# Patient Record
Sex: Male | Born: 1960 | Race: White | Hispanic: No | Marital: Married | Smoking: Never smoker
Health system: Southern US, Community
[De-identification: ages and names within clinical notes are randomized; demographics above are authoritative.]

## PROBLEM LIST (undated history)

## (undated) DIAGNOSIS — S83232A Complex tear of medial meniscus, current injury, left knee, initial encounter: Secondary | ICD-10-CM

## (undated) DIAGNOSIS — I2510 Atherosclerotic heart disease of native coronary artery without angina pectoris: Secondary | ICD-10-CM

## (undated) DIAGNOSIS — S83272A Complex tear of lateral meniscus, current injury, left knee, initial encounter: Secondary | ICD-10-CM

## (undated) DIAGNOSIS — M1990 Unspecified osteoarthritis, unspecified site: Secondary | ICD-10-CM

## (undated) DIAGNOSIS — E782 Mixed hyperlipidemia: Secondary | ICD-10-CM

## (undated) DIAGNOSIS — I712 Thoracic aortic aneurysm, without rupture, unspecified: Secondary | ICD-10-CM

## (undated) DIAGNOSIS — R011 Cardiac murmur, unspecified: Secondary | ICD-10-CM

## (undated) DIAGNOSIS — I10 Essential (primary) hypertension: Secondary | ICD-10-CM

## (undated) DIAGNOSIS — K219 Gastro-esophageal reflux disease without esophagitis: Secondary | ICD-10-CM

## (undated) DIAGNOSIS — G43909 Migraine, unspecified, not intractable, without status migrainosus: Secondary | ICD-10-CM

## (undated) DIAGNOSIS — Z950 Presence of cardiac pacemaker: Secondary | ICD-10-CM

## (undated) DIAGNOSIS — I350 Nonrheumatic aortic (valve) stenosis: Secondary | ICD-10-CM

## (undated) HISTORY — DX: Essential (primary) hypertension: I10

## (undated) HISTORY — DX: Thoracic aortic aneurysm, without rupture: I71.2

## (undated) HISTORY — DX: Cardiac murmur, unspecified: R01.1

## (undated) HISTORY — DX: Nonrheumatic aortic (valve) stenosis: I35.0

## (undated) HISTORY — DX: Atherosclerotic heart disease of native coronary artery without angina pectoris: I25.10

## (undated) HISTORY — DX: Migraine, unspecified, not intractable, without status migrainosus: G43.909

## (undated) HISTORY — DX: Thoracic aortic aneurysm, without rupture, unspecified: I71.20

## (undated) HISTORY — PX: OTHER SURGICAL HISTORY: SHX169

## (undated) HISTORY — DX: Mixed hyperlipidemia: E78.2

---

## 1978-01-17 HISTORY — PX: WISDOM TOOTH EXTRACTION: SHX21

## 2000-10-23 ENCOUNTER — Ambulatory Visit (HOSPITAL_COMMUNITY): Admission: RE | Admit: 2000-10-23 | Discharge: 2000-10-23 | Payer: Self-pay | Admitting: Cardiology

## 2000-10-23 ENCOUNTER — Encounter: Payer: Self-pay | Admitting: Cardiology

## 2004-06-03 ENCOUNTER — Ambulatory Visit: Payer: Self-pay | Admitting: Cardiology

## 2004-06-07 ENCOUNTER — Ambulatory Visit: Payer: Self-pay | Admitting: Cardiology

## 2004-07-26 ENCOUNTER — Ambulatory Visit: Payer: Self-pay | Admitting: Cardiology

## 2004-07-26 ENCOUNTER — Ambulatory Visit: Payer: Self-pay

## 2005-09-13 ENCOUNTER — Ambulatory Visit: Payer: Self-pay

## 2005-09-13 ENCOUNTER — Encounter: Payer: Self-pay | Admitting: Cardiovascular Disease

## 2005-10-12 ENCOUNTER — Ambulatory Visit: Payer: Self-pay | Admitting: Cardiology

## 2006-03-24 ENCOUNTER — Ambulatory Visit: Payer: Self-pay | Admitting: Cardiology

## 2006-04-04 ENCOUNTER — Ambulatory Visit: Payer: Self-pay | Admitting: Cardiology

## 2006-04-04 ENCOUNTER — Ambulatory Visit: Payer: Self-pay

## 2006-04-04 ENCOUNTER — Encounter: Payer: Self-pay | Admitting: Cardiology

## 2006-04-21 ENCOUNTER — Ambulatory Visit: Payer: Self-pay | Admitting: Cardiology

## 2006-09-29 ENCOUNTER — Ambulatory Visit: Payer: Self-pay | Admitting: Cardiology

## 2006-11-08 ENCOUNTER — Ambulatory Visit: Payer: Self-pay | Admitting: Internal Medicine

## 2006-11-08 ENCOUNTER — Ambulatory Visit (HOSPITAL_COMMUNITY): Admission: RE | Admit: 2006-11-08 | Discharge: 2006-11-08 | Payer: Self-pay | Admitting: Cardiology

## 2006-12-19 ENCOUNTER — Ambulatory Visit: Payer: Self-pay | Admitting: Cardiology

## 2006-12-19 LAB — CONVERTED CEMR LAB
Calcium: 9.1 mg/dL (ref 8.4–10.5)
Eosinophils Absolute: 0.4 10*3/uL (ref 0.0–0.6)
Eosinophils Relative: 5.1 % — ABNORMAL HIGH (ref 0.0–5.0)
GFR calc Af Amer: 92 mL/min
GFR calc non Af Amer: 76 mL/min
Glucose, Bld: 143 mg/dL — ABNORMAL HIGH (ref 70–99)
Lymphocytes Relative: 20.2 % (ref 12.0–46.0)
MCV: 89.6 fL (ref 78.0–100.0)
Monocytes Relative: 6.2 % (ref 3.0–11.0)
Neutro Abs: 5.8 10*3/uL (ref 1.4–7.7)
Platelets: 240 10*3/uL (ref 150–400)
Potassium: 4.1 meq/L (ref 3.5–5.1)
WBC: 8.4 10*3/uL (ref 4.5–10.5)

## 2006-12-26 ENCOUNTER — Inpatient Hospital Stay (HOSPITAL_BASED_OUTPATIENT_CLINIC_OR_DEPARTMENT_OTHER): Admission: RE | Admit: 2006-12-26 | Discharge: 2006-12-26 | Payer: Self-pay | Admitting: Internal Medicine

## 2006-12-26 ENCOUNTER — Ambulatory Visit: Payer: Self-pay | Admitting: Surgery

## 2006-12-26 ENCOUNTER — Encounter: Payer: Self-pay | Admitting: Thoracic Surgery (Cardiothoracic Vascular Surgery)

## 2006-12-26 ENCOUNTER — Ambulatory Visit (HOSPITAL_COMMUNITY): Admission: RE | Admit: 2006-12-26 | Discharge: 2006-12-26 | Payer: Self-pay | Admitting: Internal Medicine

## 2006-12-26 ENCOUNTER — Ambulatory Visit: Payer: Self-pay | Admitting: Internal Medicine

## 2006-12-28 ENCOUNTER — Ambulatory Visit: Payer: Self-pay | Admitting: Cardiovascular Disease

## 2007-01-01 ENCOUNTER — Ambulatory Visit: Payer: Self-pay | Admitting: Thoracic Surgery (Cardiothoracic Vascular Surgery)

## 2007-01-18 HISTORY — PX: CORONARY ARTERY BYPASS GRAFT: SHX141

## 2007-01-22 ENCOUNTER — Ambulatory Visit: Payer: Self-pay | Admitting: Thoracic Surgery (Cardiothoracic Vascular Surgery)

## 2007-01-25 ENCOUNTER — Encounter: Payer: Self-pay | Admitting: Thoracic Surgery (Cardiothoracic Vascular Surgery)

## 2007-01-25 ENCOUNTER — Ambulatory Visit: Payer: Self-pay | Admitting: Cardiology

## 2007-01-25 ENCOUNTER — Inpatient Hospital Stay (HOSPITAL_COMMUNITY)
Admission: RE | Admit: 2007-01-25 | Discharge: 2007-01-30 | Payer: Self-pay | Admitting: Thoracic Surgery (Cardiothoracic Vascular Surgery)

## 2007-01-25 ENCOUNTER — Ambulatory Visit: Payer: Self-pay | Admitting: Thoracic Surgery (Cardiothoracic Vascular Surgery)

## 2007-01-25 HISTORY — PX: AORTIC VALVE REPLACEMENT: SHX41

## 2007-02-07 ENCOUNTER — Ambulatory Visit: Payer: Self-pay | Admitting: Cardiology

## 2007-02-15 ENCOUNTER — Encounter (HOSPITAL_COMMUNITY): Admission: RE | Admit: 2007-02-15 | Discharge: 2007-05-16 | Payer: Self-pay | Admitting: Cardiology

## 2007-02-19 ENCOUNTER — Encounter
Admission: RE | Admit: 2007-02-19 | Discharge: 2007-02-19 | Payer: Self-pay | Admitting: Thoracic Surgery (Cardiothoracic Vascular Surgery)

## 2007-02-19 ENCOUNTER — Ambulatory Visit: Payer: Self-pay | Admitting: Thoracic Surgery (Cardiothoracic Vascular Surgery)

## 2007-03-29 ENCOUNTER — Ambulatory Visit: Payer: Self-pay | Admitting: Cardiology

## 2007-03-29 LAB — CONVERTED CEMR LAB
ALT: 16 units/L (ref 0–53)
AST: 17 units/L (ref 0–37)
Albumin: 3.4 g/dL — ABNORMAL LOW (ref 3.5–5.2)
Alkaline Phosphatase: 75 units/L (ref 39–117)
Bilirubin, Direct: 0.1 mg/dL (ref 0.0–0.3)
HDL: 28.6 mg/dL — ABNORMAL LOW (ref >39.0)
VLDL: 11 mg/dL (ref 0–40)

## 2007-04-09 ENCOUNTER — Encounter
Admission: RE | Admit: 2007-04-09 | Discharge: 2007-04-09 | Payer: Self-pay | Admitting: Thoracic Surgery (Cardiothoracic Vascular Surgery)

## 2007-04-09 ENCOUNTER — Ambulatory Visit: Payer: Self-pay | Admitting: Thoracic Surgery (Cardiothoracic Vascular Surgery)

## 2007-04-12 ENCOUNTER — Ambulatory Visit: Payer: Self-pay

## 2007-04-12 ENCOUNTER — Encounter: Payer: Self-pay | Admitting: Cardiology

## 2007-05-17 ENCOUNTER — Encounter (HOSPITAL_COMMUNITY): Admission: RE | Admit: 2007-05-17 | Discharge: 2007-06-17 | Payer: Self-pay | Admitting: Cardiology

## 2007-06-28 ENCOUNTER — Ambulatory Visit: Payer: Self-pay | Admitting: Cardiology

## 2007-10-09 ENCOUNTER — Ambulatory Visit: Payer: Self-pay | Admitting: Cardiology

## 2008-02-08 ENCOUNTER — Ambulatory Visit: Payer: Self-pay | Admitting: Cardiology

## 2008-02-28 ENCOUNTER — Ambulatory Visit: Payer: Self-pay | Admitting: Psychology

## 2008-03-19 ENCOUNTER — Ambulatory Visit: Payer: Self-pay | Admitting: Psychology

## 2008-04-04 ENCOUNTER — Ambulatory Visit: Payer: Self-pay | Admitting: Psychology

## 2008-04-14 ENCOUNTER — Ambulatory Visit: Payer: Self-pay | Admitting: Psychology

## 2008-05-31 DIAGNOSIS — I10 Essential (primary) hypertension: Secondary | ICD-10-CM | POA: Insufficient documentation

## 2008-05-31 DIAGNOSIS — E785 Hyperlipidemia, unspecified: Secondary | ICD-10-CM | POA: Insufficient documentation

## 2008-05-31 DIAGNOSIS — I359 Nonrheumatic aortic valve disorder, unspecified: Secondary | ICD-10-CM | POA: Insufficient documentation

## 2008-05-31 DIAGNOSIS — G43909 Migraine, unspecified, not intractable, without status migrainosus: Secondary | ICD-10-CM | POA: Insufficient documentation

## 2008-05-31 DIAGNOSIS — D500 Iron deficiency anemia secondary to blood loss (chronic): Secondary | ICD-10-CM | POA: Insufficient documentation

## 2008-06-04 ENCOUNTER — Encounter (INDEPENDENT_AMBULATORY_CARE_PROVIDER_SITE_OTHER): Payer: Self-pay | Admitting: *Deleted

## 2008-07-23 ENCOUNTER — Telehealth: Payer: Self-pay | Admitting: Cardiology

## 2008-08-28 ENCOUNTER — Telehealth (INDEPENDENT_AMBULATORY_CARE_PROVIDER_SITE_OTHER): Payer: Self-pay

## 2008-08-28 ENCOUNTER — Encounter (INDEPENDENT_AMBULATORY_CARE_PROVIDER_SITE_OTHER): Payer: Self-pay | Admitting: *Deleted

## 2008-08-29 ENCOUNTER — Telehealth: Payer: Self-pay | Admitting: Cardiology

## 2008-09-04 ENCOUNTER — Ambulatory Visit: Payer: Self-pay | Admitting: Cardiology

## 2008-09-04 DIAGNOSIS — R0609 Other forms of dyspnea: Secondary | ICD-10-CM | POA: Insufficient documentation

## 2008-09-04 DIAGNOSIS — R0989 Other specified symptoms and signs involving the circulatory and respiratory systems: Secondary | ICD-10-CM | POA: Insufficient documentation

## 2008-09-11 ENCOUNTER — Telehealth (INDEPENDENT_AMBULATORY_CARE_PROVIDER_SITE_OTHER): Payer: Self-pay

## 2008-09-15 ENCOUNTER — Encounter: Payer: Self-pay | Admitting: Cardiology

## 2008-09-15 ENCOUNTER — Ambulatory Visit: Payer: Self-pay

## 2008-09-15 DIAGNOSIS — I6529 Occlusion and stenosis of unspecified carotid artery: Secondary | ICD-10-CM | POA: Insufficient documentation

## 2008-09-15 DIAGNOSIS — R5381 Other malaise: Secondary | ICD-10-CM | POA: Insufficient documentation

## 2008-09-15 DIAGNOSIS — R5383 Other fatigue: Secondary | ICD-10-CM

## 2008-09-16 ENCOUNTER — Encounter: Payer: Self-pay | Admitting: Cardiology

## 2008-09-16 LAB — CONVERTED CEMR LAB
ALT: 29 units/L (ref 0–53)
Bilirubin, Direct: 0.1 mg/dL (ref 0.0–0.3)
Total Bilirubin: 1.3 mg/dL — ABNORMAL HIGH (ref 0.3–1.2)
Total CHOL/HDL Ratio: 4
VLDL: 31.2 mg/dL (ref 0.0–40.0)

## 2008-12-24 ENCOUNTER — Encounter (INDEPENDENT_AMBULATORY_CARE_PROVIDER_SITE_OTHER): Payer: Self-pay | Admitting: *Deleted

## 2009-03-04 ENCOUNTER — Encounter (INDEPENDENT_AMBULATORY_CARE_PROVIDER_SITE_OTHER): Payer: Self-pay | Admitting: *Deleted

## 2009-04-07 ENCOUNTER — Ambulatory Visit: Payer: Self-pay | Admitting: Psychology

## 2009-04-21 ENCOUNTER — Ambulatory Visit: Payer: Self-pay | Admitting: Psychology

## 2009-05-27 ENCOUNTER — Ambulatory Visit: Payer: Self-pay | Admitting: Psychology

## 2009-10-01 ENCOUNTER — Encounter: Payer: Self-pay | Admitting: Cardiology

## 2010-01-26 ENCOUNTER — Encounter: Payer: Self-pay | Admitting: Cardiology

## 2010-02-14 LAB — CONVERTED CEMR LAB
Albumin: 4.2 g/dL (ref 3.5–5.2)
Alkaline Phosphatase: 79 units/L (ref 39–117)
BUN: 17 mg/dL (ref 6–23)
Basophils Absolute: 0 10*3/uL (ref 0.0–0.1)
Bilirubin, Direct: 0 mg/dL (ref 0.0–0.3)
CO2: 31 meq/L (ref 19–32)
Calcium: 9.2 mg/dL (ref 8.4–10.5)
Creatinine, Ser: 0.9 mg/dL (ref 0.4–1.5)
Direct LDL: 51.4 mg/dL
Eosinophils Absolute: 0.3 10*3/uL (ref 0.0–0.7)
Glucose, Bld: 91 mg/dL (ref 70–99)
HDL: 35.4 mg/dL — ABNORMAL LOW (ref >39.00)
Lymphocytes Relative: 20.9 % (ref 12.0–46.0)
MCHC: 34.5 g/dL (ref 30.0–36.0)
MCV: 88.8 fL (ref 78.0–100.0)
Monocytes Absolute: 0.5 10*3/uL (ref 0.1–1.0)
Neutrophils Relative %: 69.8 % (ref 43.0–77.0)
Platelets: 197 10*3/uL (ref 150.0–400.0)
RDW: 11.5 % (ref 11.5–14.6)
TSH: 1.06 microintl units/mL (ref 0.35–5.50)
Triglycerides: 240 mg/dL — ABNORMAL HIGH (ref 0.0–149.0)
VLDL: 48 mg/dL — ABNORMAL HIGH (ref 0.0–40.0)

## 2010-02-16 NOTE — Letter (Signed)
Summary: Appointment - Missed  [facility]     [place], [place]    Phone:   Fax:      March 04, 2009 MRN: [id number]   Jonathan Jones [address] [place], [place]  [address]   Dear [name],  Our records indicate you missed your appointment on  03-03-2009   with Dr.  Daleen Squibb     .                                    It is very important that we reach you to reschedule this appointment. We look forward to participating in your health care needs. Please contact us at the number listed above at your earliest convenience to reschedule this appointment.     Sincerely,   Lorne Skeens  [facility] Scheduling Team

## 2010-02-18 NOTE — Miscellaneous (Signed)
  Clinical Lists Changes  Medications: Rx of COREG CR 80 MG XR24H-CAP (CARVEDILOL PHOSPHATE) Take 1 capsule by mouth once a day;  #30 x 11;  Signed;  Entered by: Lisabeth Devoid RN;  Authorized by: Gaylord Shih, MD, [facility];  Method used: Electronically to [id number] [address] [place], [place]  [address], Ph: [phone], Fax: [phone]    Prescriptions: COREG CR 80 MG XR24H-CAP (CARVEDILOL PHOSPHATE) Take 1 capsule by mouth once a day  #30 x 11   Entered by:   Lisabeth Devoid RN   Authorized by:   Gaylord Shih, MD, [facility]   Signed by:   Lisabeth Devoid RN on 01/26/2010   Method used:   Electronically to        [facility]* (retail)       [address]       [place], [place]  [address]       Ph: [phone]       Fax: [phone]   RxID:   [id number]

## 2010-06-01 NOTE — Op Note (Signed)
NAME:  Jonathan Jones, Jonathan Jones NO.:  [id number]   MEDICAL RECORD NO.:  [id number]          PATIENT TYPE:  INP   LOCATION:  2309                         FACILITY:  [facility]   PHYSICIAN:  Salvatore Decent. Cornelius Moras, M.D. DATE OF BIRTH:  09-24-1960   DATE OF PROCEDURE:  01/25/2007  DATE OF DISCHARGE:                               OPERATIVE REPORT   PREOPERATIVE DIAGNOSES:  1. Severe aortic stenosis.  2. Two-vessel coronary artery disease.   POSTOPERATIVE DIAGNOSES:  1. Severe aortic stenosis.  2. Two-vessel coronary artery disease.   PROCEDURE:  Median sternotomy for aortic valve replacement (25-mm  Edwards Magna pericardial tissue valve) and coronary artery bypass  grafting x3 (left internal mammary artery to distal left anterior  descending coronary artery, saphenous vein graft to first diagonal  branch, saphenous vein graft to posterior descending coronary artery,  endoscopic saphenous vein harvest from right thigh) and CorMatrix  reconstruction of the pericardium.   SURGEON:  Salvatore Decent. Cornelius Moras, M.D.   ASSISTANT:  Salvatore Decent. Dorris Fetch, M.D.   SECOND ASSISTANT:  Stephanie Acre. Dominick, PA   ANESTHESIA:  General.   BRIEF CLINICAL NOTE:  The patient is a 50 year old male from [place]  with known history of bicuspid aortic valve and aortic stenosis.  He has  been followed carefully and now presents with progressive symptoms of  exertional shortness of breath and chest discomfort.  Echocardiogram  confirms progression of the severity of aortic stenosis with calculated  aortic valve area estimated 0.89 sq. cm.  There is normal left  ventricular function.  Cardiac catheterization confirms the presence of  severe aortic stenosis and also demonstrates moderate two-vessel  coronary artery disease.  A full consultation has been dictated  previously.  The patient has been counseled at length regarding the  indications, risks, and potential benefits of surgery.  Alternative  treatment  strategies have been discussed.  He understands and accepts  all associated risks of surgery and desired to proceed as described.  In  particular, the patient has been counseled regarding alternatives for  replacement of his aortic valve with respect to use of a mechanical  prosthesis versus a bioprosthetic tissue valve.  After considerable  discussion, the patient specifically requests that a bioprosthetic  tissue valve be utilized in an effort to avoid the need for long-term  anticoagulation with Coumadin.  The patient understands that given his  age, there is significant risk of late structural valve deterioration  and failure.   OPERATIVE FINDINGS:  1. Normal left ventricular systolic function with moderate left      ventricular hypertrophy.  2. Bicuspid native aortic valve with severe aortic stenosis.  3. Good-quality left internal mammary artery and saphenous vein      conduit for grafting.  4. Good-quality target vessels for grafting   OPERATIVE NOTE IN DETAIL:  The patient is brought to the operating room  on the above-mentioned date and central monitoring is established by the  anesthesia service under the care and direction of Dr. Diamantina Monks.  Specifically, a Swan-Ganz catheter is placed through the right internal  jugular approach.  A radial arterial line is placed.  Intravenous  antibiotics are administered.  Following induction with general  endotracheal anesthesia, a Foley catheter is placed.  The patient's  chest, abdomen, both groins, and both lower extremities are prepared and  draped in a sterile manner.  Baseline transesophageal echocardiogram was  performed by Dr. Katrinka Blazing.  This confirms the presence of severe aortic  stenosis with a heavily-calcified aortic valve.  There is trace mitral  regurgitation.  There is normal left ventricular systolic function with  at least moderate left ventricular hypertrophy.   A median sternotomy incision is performed and left  internal mammary  artery is dissected from the chest wall and prepared for bypass  grafting.  The left internal mammary artery is good-quality conduit.  Simultaneously saphenous vein was obtained the patient's right thigh  using endoscopic vein harvest technique.  The saphenous vein is good-  quality conduit.  After the saphenous vein has been removed, the small  incision in the right thigh is closed in multiple layers with running  absorbable suture.  The patient is heparinized systemically and the left  internal mammary artery transected distally.  It is noted to have  excellent flow.   The pericardium is opened.  The ascending aorta is mildly dilated but  otherwise normal in appearance.  Maximum transverse diameter of the  ascending thoracic aorta is approximately 4.0 cm.  The ascending aorta  is cannulated for cardiopulmonary bypass.  A venous cannula is placed  through the tip of the right atrial appendage.  A retrograde  cardioplegia catheter is placed through the right atrium into the  coronary sinus.  Adequate heparinization is verified.  Cardiopulmonary  bypass is begun and a left ventricular vent is placed through the right  superior pulmonary vein.  Distal target sites are selected for coronary  bypass grafting.  A temperature probe is placed in the left ventricular  septum and a cardioplegia catheter is placed in the ascending aorta.   The patient is cooled to 32 degrees' systemic temperature.  The aortic  crossclamp is applied and cold blood cardioplegia is administered  initially in antegrade fashion through the aortic root.  Iced saline  slush is applied for topical hypothermia.  The initial cardioplegic  arrest and myocardial cooling is felt to be excellent.  Supplemental  cardioplegia is administered retrograde through the coronary sinus  catheter.  Repeat doses of cardioplegia are administered intermittently  throughout the crossclamp portion of the operation through  the aortic  root, down subsequently-placed vein grafts, and retrograde through the  coronary sinus catheter to maintain left ventricular septal temperature  below 15 degrees centigrade.   The following distal coronary anastomoses are performed:  1. Posterior descending coronary artery is grafted with a saphenous      vein graft in an end-to-side fashion.  This vessel measures 1.5 mm      in diameter and is a good-quality target vessel for grafting.  2. The diagonal branch off the left anterior descending coronary      artery is grafted with a saphenous vein graft in an end-to-side      fashion.  This vessel measures 1.4 mm and is a good-quality target      vessel for grafting.  3. The distal left anterior descending coronary artery is grafted with      the left internal mammary artery in an end-to-side fashion.  This      vessel measures 2.0 mm in diameter and is a good-quality  target      vessel for grafting.   An oblique aortotomy incision is performed.  The aortic valve is  exposed.  The aortic valve is congenitally bicuspid with a fused raphe.  There is severe aortic stenosis with heavy calcification of both  leaflets.  The left and right coronary arteries are both in a normal  anatomical location.  The aortic valve is excised sharply.  The aortic  annulus is decalcified.  There is a moderate amount of calcification.  The aortic root is subsequently irrigated with copious iced saline  solution.  The aortic annulus is sized to accept a 25-mm stented  bioprosthetic tissue valve.   Aortic valve replacement is performed using interrupted 2-0 Ethibond  horizontal mattress pledgeted sutures with pledgets in the subannular  position.  An [facility] pericardial tissue valve (model number 3000,  serial number [id number]) is secured in place uneventfully.  The valve  seats easily and there is plenty of distance between the sewing ring and  the left main coronary artery and the right  coronary arteries,  respectively.  Rewarming is begun.   The aortotomy is closed using a two-layer closure of running 4-0 Prolene  suture.  Both proximal saphenous vein anastomoses are performed directly  to the ascending aorta prior to removal of the aortic crossclamp.  The  patient is placed in Trendelenburg position and the lungs are allowed to  fill while all air is evacuated through the aortic root.  One final dose  of warm retrograde hot-shot cardioplegia is administered.  The aortic  crossclamp is removed after a total crossclamp time of 129 minutes.  The  heart is defibrillated and subsequently normal sinus rhythm resumes.  All proximal and distal coronary anastomoses are inspected for  hemostasis and appropriate graft orientation.  The aortotomy incision is  carefully inspected for meticulous hemostasis.  Epicardial pacing wires  are affixed to the right ventricular free wall and to the right atrial  appendage.  The left ventricular vent is removed and a Magoon needle is  placed in the ascending aorta to serve as a root vent.  The lungs were  ventilated and the patient subsequently weaned from cardiopulmonary  bypass without difficulty.  The patient's rhythm at separation from  bypass is normal sinus rhythm with first-degree AV block.  Initially AV  sequential pacing is employed but pacing is discontinued when sinus  rhythm without AV block resumes shortly after separation from bypass.  Total cardiopulmonary bypass time for the operation is 115 minutes.  No  inotropic support is required.  Follow-up transesophageal echocardiogram  demonstrates preserved left ventricular function.  There is a well-  seated bioprosthetic tissue valve in the aortic position.  There is no  perivalvular leak.  There is no mitral regurgitation.  There is no  residual air.   The aortic root vent is removed.  The venous and arterial cannulae are  removed.  Protamine is administered to reverse the  anticoagulation.  The  mediastinum and left chest are irrigated with saline solution containing  vancomycin.  Meticulous surgical hemostasis is ascertained.  The  mediastinum and the left chest are drained using three chest tubes  exited through separate stab incisions inferiorly.  The pericardial sac  is reconstructed using a CorMatrix pericardium closure.  The patch is  sewn circumferentially using running 4-0 Prolene suture.  The sternum is  closed with double-strength sternal wire.  The On-Q continuous pain  management system is utilized to facilitate postoperative pain  control.  Two 10-inch catheters supplied with the On-Q kit are tunneled into the  deep subcutaneous tissues and positioned just lateral to the lateral  border of the sternum on either side.  Each catheter is flushed with 5  mL of 0.5% bupivacaine solution and ultimately connected to a continuous  infusion pump.  The soft tissues anterior the sternum are closed in  multiple layers and the skin is closed with a running subcuticular skin  closure.   The patient tolerated the procedure well and is transported to the  surgical intensive care unit in stable condition.  There are no  intraoperative complications.  All sponge, instrument and needle counts  are verified correct at completion of the operation.  No blood products  were administered.      Salvatore Decent. Cornelius Moras, M.D.  Electronically Signed     CHO/MEDQ  D:  01/25/2007  T:  01/25/2007  Job:  [id number]   cc:   Thomas C. Daleen Squibb, MD, [facility]  Theressa Millard, M.D.

## 2010-06-01 NOTE — Assessment & Plan Note (Signed)
[facility]                            CARDIOLOGY OFFICE NOTE   NAME:Jonathan Jones, Jonathan Jones                       MRN:          [id number]  DATE:06/28/2007                            DOB:          [birth year]    Jonathan Jones comes in today for followup.  He is now about 5 months out from  his aortic valve replacement and coronary artery bypass grafting.   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  He is  feeling remarkably good.  He has loosened up on his diet a bit, but his  weight is still about 5 or 6 pounds less than his baseline weight.  He  feels good.  He denies any chest discomfort, soreness in his chest,  orthopnea, PND, exertional dizziness, pre-syncope, syncope or peripheral  edema.   His lipids were so low that we decreased his to Lipitor 10 mg daily.  He  is due lipids next week with Benjaman Kindler.   CURRENT MEDICATIONS:  1. Aspirin 325 a day.  2. Coreg CR 80 mg a day.  3. Doxepin 25 mg two a day.  4. Lipitor 10 mg a day.   His blood pressure is 152/92.  I repeated it in the left arm with a  large cuff and it was 135/85, his pulse is 62 and regular.  Weight is  188.  HEENT:  His skin color is back to normal. Respirations 18 unlabored.  HEENT:  Otherwise unchanged.  NECK: Neck is supple, good carotid upstrokes.  Thyroid is not enlarged.  Trachea is midline.  LUNGS:  Clear.  HEART:  Sternotomy site is stable.  Heart reveals a regular rate and  rhythm.  He has no murmur, S2 splits physiologically.  ABDOMEN:  Is  soft.  EXTREMITIES: No edema.  Pulses are intact.  NEUROLOGIC:  Exam is intact.   A 2-D echocardiogram April 12, 2007, showed trivial perivalvular leak.   Jonathan Jones is doing remarkably well.  I have made no changes to his medical  program.  We talked about avoiding isometrics but other forms of  activity or findings as long as he stays well-hydrated and does not  overdo it.  He will have his lipids checked  by Benjaman Kindler in the next  week or so.  LDL goal is 70 or less. Hopefully, his HDL is back in the  high 30s to low 40s.  Hope his triglycerides will remain low with good  dietary control.   Plan on seeing him back again in 3 months.     Thomas C. Daleen Squibb, MD, [facility]  Electronically Signed    TCW/MedQ  DD: 06/28/2007  DT: 06/28/2007  Job #: [id number]   cc:   Theressa Millard, M.D.

## 2010-06-01 NOTE — Assessment & Plan Note (Signed)
[facility]                            CARDIOLOGY OFFICE NOTE   NAME:[name], [name]                       MRN:          [id number]  DATE:02/08/2008                            DOB:          07/20/1960    Jonathan Jones comes in today for followup.  He is doing remarkably well.  He  continues to exercise on a regular basis and is keeping his weight  stable.   He is currently on aspirin 325 mg a day, Coreg CR 80 mg a day, doxepin  25 mg 2 daily, Lipitor 10 mg at bedtime.   PHYSICAL EXAMINATION:  VITAL SIGNS:  His blood pressure today is the  best I have seen, it is 111/79, his pulse is 73 and regular, his weight  is 187, which is stable.  HEENT:  Normal.  NECK:  Carotid upstrokes were equal bilaterally without bruits.  Thyroid  is not enlarged.  Trachea is midline.  LUNGS:  Clear to auscultation and percussion.  HEART:  Regular rate and rhythm.  S2 splits.  There is no diastolic  murmur.  ABDOMEN:  Soft.  Good bowel sounds.  No midline bruit.  EXTREMITIES:  No cyanosis, clubbing, or edema.  Pulses are intact.   EKG today is completely normal except for some nonspecific T-wave  changes.   ASSESSMENT/PLAN:  Jonathan Jones is doing remarkably well.  His bowel sounds are  stable.  His last echo was a year ago in March, which basically showed a  trivial perivalvular leak.   I will plan on seeing him back again in 6 months.  At that time, we will  set him up for a 2-D echo and stress Myoview.     Thomas C. Daleen Squibb, MD, [facility]  Electronically Signed    TCW/MedQ  DD: 02/08/2008  DT: 02/09/2008  Job #: [id number]

## 2010-06-01 NOTE — Discharge Summary (Signed)
NAME:  Jonathan Jones, Jonathan Jones NO.:  [id number]   MEDICAL RECORD NO.:  [id number]          PATIENT TYPE:  INP   LOCATION:  2017                         FACILITY:  [facility]   PHYSICIAN:  Salvatore Decent. Cornelius Moras, M.D. DATE OF BIRTH:  1960-02-02   DATE OF ADMISSION:  01/25/2007  DATE OF DISCHARGE:  01/30/2007                               DISCHARGE SUMMARY   PRIMARY ADMITTING DIAGNOSES:  1. Severe aortic stenosis.  2. Two-vessel coronary artery disease.   ADDITION/DISCHARGE DIAGNOSES:  1. Severe aortic stenosis.  2. Two-vessel coronary artery disease.  3. Hypertension and hyperlipidemia.  4. History of migraines.  5. Postoperative blood loss anemia.   PROCEDURES PERFORMED:  1. Aortic valve replacement with 25-mm Edwards magna pericardial      valve.  2. Coronary artery bypass grafting x3 (left internal mammary artery to      the distal LAD, saphenous vein graft to the first diagonal,      saphenous vein graft to the posterior descending).  3. Endoscopic vein harvest right thigh.  4. Core matrix reconstruction of the pericardium.   HISTORY:  The patient is a 50 year old male with a known history of  bicuspid aortic valve and aortic stenosis who has been followed since  approximately age 28.  He recently presented with progressive symptoms  of exertional shortness of breath and chest discomfort.  He was seen by  Dr. Daleen Squibb and underwent echocardiogram which confirmed worsening severity  of aortic stenosis with a calculated aortic valve area estimated at 0.89  cm2.  Left ventricular function was normal.  He subsequently underwent  cardiac catheterization by Dr. Gala Romney which showed severe aortic  stenosis with moderate two-vessel coronary artery disease.  Because of  his worsening symptoms of shortness of breath and chest discomfort and  his worsening aortic stenosis with underlying coronary artery disease.  It was felt that he would best be treated with elective aortic valve  replacement and coronary artery bypass surgery.  He was referred to Dr.  Tressie Stalker as an outpatient consultation and for consideration of  surgery.  Dr. Cornelius Moras saw the patient and reviewed his films and agreed  that he would benefit from surgery.  He explained the risks, benefits  and alternatives of the procedure to the patient and he agreed to  proceed.  The patient has been counseled prior to surgery regarding the  options for valve replacement, and he specifically requests a  bioprosthetic tissue valve to utilize in order to reduce his need for  long-term anticoagulation with Coumadin.   HOSPITAL COURSE:  Mr. Doverspike was brought into [facility] on  January 25, 2007, and underwent CABG x3 and aortic valve replacement by  Dr. Cornelius Moras as described in detail above.  He tolerated the procedures well  and was transferred to the SICU in stable condition.  He was able to be  extubated shortly after surgery.  He was hemodynamically stable and  doing well on postop day #1.  He did initially require a low-dose Neo-  Synephrine drip but this was weaned over the course of his first  postoperative day.  By the end of postop day #1, he was off all drips  and was able to transfer to the step-down unit.  Postoperatively, he has  done very well.  He has had postoperative blood loss anemia which has  remained stable and has been treated conservatively with iron  supplementation.  He has also been volume overloaded and is diuresing  well with Lasix.  His creatinine bumped up initially to 1.52, but is  trending downward at present.  He is ambulating the halls with cardiac  rehab phase one and is making good progress.  He is tolerating a regular  diet and is having normal bowel and bladder function.  His incisions are  all healing well.  His most recent labs showed hemoglobin 7.9,  hematocrit 22.8, white count 11.0, platelets 130, sodium 132, potassium  5.3, BUN 38, creatinine 1.38.  It was felt that  if he remained stable  and no acute changes occur.  He will hopefully be ready for discharge  home by January 30, 2007.   DISCHARGE MEDICATIONS:  1. Enteric-coated aspirin 325 mg daily.  2. Coreg 12.5 mg b.i.d.  3. Ultram 50-100 mg q. 4-6 hours p.r.n. for pain.  4. Nu-Iron 150 mg daily.  5. Lasix 40 mg daily x1 week.  6. K-Dur 20 mEq daily x1 week.  7. Tricor 160 mg daily.  8. Doxepin 50 mg q.h.s.   DISCHARGE INSTRUCTIONS:  He is asked to refrain from driving, heavy  lifting or strenuous activity.  He may continue ambulating daily and  using his incentive spirometer.  He may shower daily and clean his  incisions with soap and water.  He will continue low-fat, low-sodium  diet.   DISCHARGE FOLLOWUP:  He will make an appointment to see Dr. Daleen Squibb in 2  weeks.  He will then follow up with Dr. Cornelius Moras on February 2 at 1:45 p.m..  He will have chest x-ray prior to this appointment at [facility].  In the interim, if he experiences any  problems or has questions he is asked to contact our office immediately.      Coral Ceo, P.A.      Salvatore Decent. Cornelius Moras, M.D.  Electronically Signed    GC/MEDQ  D:  01/29/2007  T:  01/29/2007  Job:  [id number]   cc:   Thomas C. Daleen Squibb, MD, [facility]  [facility] Office  Theressa Millard, M.D.

## 2010-06-01 NOTE — Cardiovascular Report (Signed)
Jonathan Jones, Jonathan Jones NO.:  [id number]   MEDICAL RECORD NO.:  [id number]          PATIENT TYPE:  OUT   LOCATION:  VASC                         FACILITY:  [facility]   PHYSICIAN:  Bevelyn Buckles. Bensimhon, MDDATE OF BIRTH:  [birth year]   DATE OF PROCEDURE:  12/26/2006  DATE OF DISCHARGE:                            CARDIAC CATHETERIZATION   PRIMARY CARE PHYSICIAN:  Theressa Millard, M.D.  Cardiologist, Jesse Sans.  Daleen Squibb, MD, [facility]   INDICATIONS:  Jonathan Jones is a delightful, 50 year old male with a history  of bicuspid aortic valve.  He also has a history of hypertension and  hyperlipidemia.  He has been having progressive dyspnea.  He underwent  CPX testing which showed a marked cardiac limitation with evidence of  global ischemia thought secondary to his aortic valve.  He now presents  for a preop cardiac catheterization to evaluate his coronary arteries,  evaluate his coronary arteries and also assess his filling pressures.   PROCEDURES PERFORMED:  1. Right heart catheterization.  2. Left heart catheterization.  3. Selective coronary angiogram.  4. Left ventriculogram.   DESCRIPTION OF PROCEDURE:  The risks and indication of catheterization  were explained.  Consent was signed and placed on the chart.  A 4-French  arterial sheath was placed in the right femoral artery by using a  modified Seldinger technique.  A JL-5 was used to image the left  coronaries.  A 3-D RC was used for the right coronary.  In order to get  across the aortic valve, we used a 3-D RC with a straight exchange wire  and exchanged out for a pigtail catheter.  There are no apparent  complications.  A 7-French venous sheath was placed in the right femoral  vein and standard Swan-Ganz catheter was used for right heart  catheterization.   HEMODYNAMIC RESULTS:  Right atrial pressure with mean of 7.  RV pressure  29/4.  PA pressure 30/15 with a mean of 22.  Wedge pressure was 13 with  a mean of 13.  Central  aortic pressure was 119/69 with a mean of 90.  Left ventricle was 185/13 with EDP of 24.  The aortic valve peak  gradient was 48.  Mean gradient was 39.  Aortic valve area was 0.89 sq  cm.  Fick cardiac output was 5.5 L per minute.  Cardiac index was 2.6  L/sq m.   Left main was calcified, but angiographically normal.   LAD was a long vessel coursing to the apex.  It gave off one large and  then a second moderate-sized diagonal.  There was a 50-60% calcified  lesion in the proximal midportion of the LAD as well as a 40% lesion in  the midsection.  In the proximal portion of the first large diagonal,  there was a 70% tubular lesion.  In the proximal portion of the second  to moderate-sized diagonal, there is a 50% lesion.   Left circumflex gave off a small OM-1, a moderate-sized OM-2 and a small  posterolateral.  There was a 30% lesion in the ostial  circumflex.   Right coronary artery was a dominant vessel that gave off moderate-sized  PDA and a moderate-sized posterolateral.  There was a 30-40% lesion in  the midsection of the RCA.  In the proximal portion just before the PDA,  there was a 50% lesion.  Just after the PDA in the distal RCA, there was  a 70% lesion.   Left ventriculogram done in the RAO position showed a heavily calcified  aortic valve with a mildly dilated aortic root.  Ejection fraction was  55-60% with no regional wall motion abnormalities.   ASSESSMENT:  1. Severe aortic stenosis due to bicuspid valve.  2. Two-vessel coronary artery disease with a heavily calcified left      anterior descending.  3. Normal left ventricular function.  4. Mildly dilated aortic root.   PLAN:  He has severe aortic stenosis.  His LAD is heavily calcified and  has a borderline lesion.  I do think he may benefit from grafting of the  LAD and the diagonal.  We will refer him to Dr. Cornelius Moras and CVTS for  evaluation for AVR and possible CABG.  He will need a CT scan of  thoracic aorta to  further size this to see if he needs root replacement  as well.      Bevelyn Buckles. Bensimhon, MD  Electronically Signed     DRB/MEDQ  D:  12/26/2006  T:  12/26/2006  Job:  [id number]

## 2010-06-01 NOTE — Assessment & Plan Note (Signed)
OFFICE VISIT   LEDFORD, GOODSON A  DOB:  03/16/1960                                        April 09, 2007  CHART #:  [id number]   HISTORY OF PRESENT ILLNESS:  Mr. Wager returns for further follow-up  status post aortic valve replacement and coronary artery bypass grafting  x3 on 01/25/07.  He was last seen here in the office on 02/19/07.  Since  the he has continued to do quite well.  He has been seen recently in  follow-up by Dr. Daleen Squibb and he has had no further problems.  He is no back  to enjoying fairly normal physical activity.  He has minimal residual  soreness in his chest.  He otherwise feels well and he has been actively  participating in rehab.  He has no shortness of breath.  The remainder  of his review of systems is unrevealing.   PHYSICAL EXAMINATION:  GENERAL:  Notable for well appearing male.  VITALS:  Blood pressure 135/88, pulse 71, oxygen saturation 98% on room  air.  EXTREMITIES:  His median sternotomy scar is healed nicely.  He had two  small scabs over where a few subcutaneous tissues were anterior and this  has healed over.  LUNGS:  Breath sounds are clear to auscultation and symmetrical  bilaterally.  CARDIOVASCULAR EXAM: Notable for regular rate and rhythm.  ABDOMEN: Soft, nontender.  EXTREMITIES:  Warm and well perfused.   DIAGNOSTIC TESTS:  Chest performed today reveals clear lung fields  bilaterally.  The previous left pleural effusion has now virtually  completed resolved.  No other abnormalities are noted.   IMPRESSION:  Satisfactory progress now almost 3 months following aortic  valve replacement stent and coronary artery bypass grafting.  Mr. Banks  is doing quite well.   PLAN:  I have encouraged Mr. Keyworth to continue to gradually increase his  physical activity as tolerated over the next 6-8 weeks.  I think he can  begin doing some exercises with his arms and shoulders as long as he  gradually increases over time and does not  try to do too much all at  once. He has asked about shooting  a shotgun during [place] season in  April, and I suspect this may be a bit early.  He may be able to go  hunting with his bow, but I am a bit concerned about the potential for  problems with recoil from a shotgun at this early stage.  Within another  8 weeks or so his bone should be fairly solid and he could probably  return to normal activity without any limitations.  All of his questions  have been addressed.   Salvatore Decent. Cornelius Moras, M.D.  Electronically Signed   CHO/MEDQ  D:  04/09/2007  T:  04/09/2007  Job:  [id number]   cc:   Jesse Sans. Daleen Squibb, MD, [facility]  Theressa Millard, M.D.

## 2010-06-01 NOTE — Assessment & Plan Note (Signed)
OFFICE VISIT   Jonathan Jones, Jonathan Jones  DOB:  1960-12-03                                        February 19, 2007  CHART #:  [id number]   HISTORY OF PRESENT ILLNESS:  Jonathan Jones returns for routine followup status  post aortic valve replacement and coronary artery bypass graft x3 on  January 25, 2007. His postoperative recovery has been uneventful.  Following hospital discharge, he has continued to improve. He has been  seen in followup by Dr. Daleen Squibb and he returns to our office for Jones routine  followup today. Overall, the patient is doing quite well. He has minimal  residual soreness in his chest and he is no longer taking any pain  medication. He reports no shortness of breath. His appetite is good. He  is getting around quite well. He states that he still feels tired, but  otherwise he overall has no significant complaints.   CURRENT MEDICATIONS:  Remain unchanged from the time of hospital  discharge except for the fact that he is now taking Lipitor.   PHYSICAL EXAMINATION:  Is notable for Jones well-appearing male. Blood  pressure 112/77, pulse 90, oxygen saturation is 99% on room air.  Examination of the chest is notable for median sternotomy incision that  is healing very nicely. The sternum is stable on palpation. Breath  sounds are clear to auscultation with slightly diminished breath sounds  at the left lung base. No wheezing or rhonchi are noted. CARDIOVASCULAR:  Reveals regular rate and rhythm. ABDOMEN: Soft and nontender.  EXTREMITIES: Are warm and well-perfused. The small incision from  endoscopic vein harvest has healed nicely. The remainder of his physical  examination is unrevealing.   DIAGNOSTIC TESTS:  CHEST X-RAY: Obtained today, is reviewed. This  demonstrates Jones very small left pleural effusion. The lung fields are  otherwise clear. All the sternal wires appear intact.   IMPRESSION:  Satisfactory progress following recent aortic replacement  and coronary  artery bypass grafting. The patient does have Jones very small  left pleural effusion.   PLAN:  I have encouraged the patient to continue to gradually increase  his physical activity as tolerated with his only limitation at this  point remaining that he refrain from heavy lifting or strenuous use of  his arms or shoulders for at least another 2-3 months. I have encouraged  him to get started in the cardiac rehab program and to start pushing  himself more physically. I think he can resume driving an automobile.  All of his questions have been addressed. We will plan to see him back  in 6-8 weeks with Jones followup chest x-ray to make sure that his small  left pleural effusion is resolved.   Salvatore Decent. Cornelius Moras, M.D.  Electronically Signed   CHO/MEDQ  D:  02/19/2007  T:  02/19/2007  Job:  [id number]   cc:   Thomas C. Daleen Squibb, MD, [facility]  Theressa Millard, M.D.

## 2010-06-01 NOTE — Assessment & Plan Note (Signed)
[facility]                            CARDIOLOGY OFFICE NOTE   NAME:Jonathan Jones                        MRN:          [id number]  DATE:12/11/2006                            DOB:          December 19, 1960    Date of procedure will be December 26, 2006.   CHIEF COMPLAINT:  Shortness of breath with exertion.   HISTORY OF PRESENT ILLNESS:  Jonathan Jones is a 50 year old married white  male with the above complaint.   I have followed him for a number of years in the office with a bicuspid  aortic valve with progressive aortic stenosis.   His last echocardiogram April 04, 2006 showed a bicuspid valve, moderate  aortic stenosis with a mean gradient of 35 mmHg and a valve area of  1.19.  He has moderate aortic root diltation by CT March 2008 of 4.4 x  4.3 sonometers.  He has normal left ventricular function.  Had mild to  moderately increased pulmonary systolic pressures.   Because of some increased dyspnea on exertion we performed a CPX stress  test on November 08, 2006.  This clearly showed a decrease in functional  capacity secondary to his aortic valve.  His effort was good, his O2  pulse increased slowly throughout exercise.   PAST MEDICAL HISTORY:  He is currently on:  1. Aspirin 81 mg a day.  2. Tricor 160 mg a day.  3. Coreg 12.5 mg p.o. b.i.d.  4. Doxepin HCl 25 mg p.o. daily.   HE HAS NO KNOWN DRUG ALLERGIES.   He has a history of migraine headaches and allergies.   SURGERIES:  Vasectomy and wisdom teeth extraction.   He does not smoke.  He does drink alcohol socially.   FAMILY HISTORY:  Negative for premature coronary disease.   SOCIAL HISTORY:  He is married.  He is a Surveyor, minerals.  He is strong  advocate of wildlife conservation.   REVIEW OF SYSTEMS:  Negative other than the HPI.  He has had no syncope  and no chest tightness.   EXAMINATION:  He is in no acute distress.  Blood pressure is 124/86,  pulse 61 and regular, his weight is 191.  HEENT:  Normocephalic/atraumatic.  PERRLA.  Extraocular movements  intact.  Sclerae are clear.  Facial symmetry is normal.  Dentition is  satisfactory.  NECK:  Supple.  Carotid upstrokes were equal bilaterally with referred  sounds bilaterally.  Thyroid is not enlarged.  Trachea is midline.  LUNGS:  Clear.  HEART:  Reveals a nondisplaced PMI.  There is no obvious S4.  He has a  aortic stenosis murmur.  His S2 seems to split.  There is no aortic  insufficiency heard.  There is no gallop.  ABDOMINAL:  Soft with good bowel sounds.  EXTREMITIES:  Reveal no edema.  Pulses are intact.  NEURO:  Intact.  SKIN:  Unremarkable.   His electrocardiogram shows normal sinus rhythm with LVH.  He has  lateral ST segment changes as well.   ASSESSMENT:  1. Increased dyspnea on exertion with a CPX study suggesting  significant aortic valve stenosis causing functional limitation.  2. Hypertension.  3. Mixed hyperlipidemia.  4. History of migraines.  5. Seasonal allergies.   PLAN:  Right and left heart catheterization.  Indications, risks,  potential benefits have been discussed with the patient.     Thomas C. Daleen Squibb, MD, [facility]  Electronically Signed    TCW/MedQ  DD: 12/11/2006  DT: 12/11/2006  Job #: [id number]   cc:   Theressa Millard, M.D.

## 2010-06-01 NOTE — Assessment & Plan Note (Signed)
[facility]                            CARDIOLOGY OFFICE NOTE   NAME:Dec, Jonathan Jones                       MRN:          [id number]  DATE:03/29/2007                            DOB:          09-20-1960    Jonathan Jones comes in today for follow-up.  He is now about 10 weeks out from  a pericardial aortic valve replacement as well as coronary bypass  grafting with left internal mammary graft to the distal LAD, vein graft  to first diagonal, vein graft to posterior descending vessel.  He has a  followup with Dr. Cornelius Moras soon, who hopefully will release him back to bow  hunting and rifle hunting, and shotgun shooting before [place] season.   He had some postoperative pericarditis with a rub.   He has lost a total of about 20 pounds.  He is doing a fair amount of  cardio.  He has only had 1 steak since his surgery.  He has really cut  back on alcohol.   His current meds are doxepin, aspirin 325 mg a day, Lipitor 20 mg  nightly, Coreg CR 80 mg a day.   His blood pressure is 130/80, his pulse about 80 and regular.  HEENT:  Unchanged.  His weight is 183 down additional 4.  Carotids are full without bruits.  Thyroid is not enlarged.  Trachea is  midline.  LUNGS:  Clear with no rub, no decreased breath sounds in the bases.  HEART:  Reveals a normal S1, soft systolic murmur.  No diastolic  component.  S2 splits physiologically.  There is no rub.  ABDOMEN:  Soft, good bowel sounds.  No midline bruit.  No hepatomegaly.  EXTREMITIES:  No cyanosis, clubbing or edema.  Pulses are intact.  NEURO:  Exam is intact.   We have blood work pending.   ASSESSMENT/PLAN:  Jonathan Jones is doing remarkably well.  As far as I am  concerned, he is released to shoot a lower test bow, as long as it is  not hurting him.  Will let Dr. Cornelius Moras have the final say.  I will check  his blood work and give him a call.  I will see him back again in June  for a 2-D echocardiogram to follow up on his  aortic valve replacement  and LV function.     Thomas C. Daleen Squibb, MD, [facility]  Electronically Signed    TCW/MedQ  DD: 03/29/2007  DT: 03/30/2007  Job #: [id number]   cc:   Theressa Millard, M.D.  Salvatore Decent. Cornelius Moras, M.D.

## 2010-06-01 NOTE — Assessment & Plan Note (Signed)
[facility]                            CARDIOLOGY OFFICE NOTE   NAME:Stalling, PHARRELL LEDFORD                  MRN:          [id number]  DATE:10/09/2007                            DOB:          [birth year]    Jonathan Jones comes in today for followup.  He is now about 8 months out from  his aortic valve replacement and coronary bypass grafting.   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  He is having no symptoms of angina or ischemia.  He is having  no symptoms of any congestive heart failure.   Dr. Earl Gala repeated lipids on 10 mg of Lipitor.  His total cholesterol  was 146, triglycerides 164, LDL 71, HDL 46.  His HDL was then pushed  down to 28 with Lipitor 20 and extreme dietary compliance.  He is no  longer on TriCor.   MEDICATIONS:  His only meds are;  1. Aspirin 325 a day.  2. Coreg CR 80 mg a day.  3. Doxepin 25 mg 2 daily.  4. Lipitor 10 mg q.h.s.   PHYSICAL EXAMINATION:  VITAL SIGNS:  Blood pressure today is 140/92, his  pulse 71 and regular.  His weight is 187 and stable.  HEENT:  Normal.  NECK:  Carotid upstrokes were equal bilaterally without bruits.  No JVD.  Thyroid is not enlarged.  Trachea is midline.  LUNGS:  Clear.  HEART:  Reveals soft systolic murmur.  Normal S2.  No diastolic murmur  could be appreciated.  ABDOMEN:  Soft.  EXTREMITIES:  No edema.  Pulses are intact.  NEUROLOGIC:  Intact.   ASSESSMENT AND PLAN:  Jonathan Jones is doing remarkably well.  I have made no  changes in his medical program.  I will plan on seeing him back again in  January 2010.  He will call if there is any issues.     Thomas C. Daleen Squibb, MD, [facility]  Electronically Signed    TCW/MedQ  DD: 10/09/2007  DT: 10/10/2007  Job #: [id number]

## 2010-06-01 NOTE — Assessment & Plan Note (Signed)
OFFICE VISIT   JAZION, ATTEBERRY A  DOB:  12/01/1960                                        January 22, 2007  CHART #:  [id number]   HISTORY OF PRESENT ILLNESS:  Mr. Jonathan Jones returns for a further follow-up  and preoperative counseling with tentative plans for elective aortic  valve replacement and coronary artery bypass grafting on Thursday,  January 8  He was last seen here in the office on December 15.  Since  then he has done well.  He still complains of  exertional shortness of  breath and chest discomfort.  He has not had any resting symptoms of  chest pain nor shortness of breath.  He has not had any syncopal  episodes or dizzy spells.  He denies any fevers, chills or productive  cough.  The remainder of his review of systems is unchanged from  previously.   I again spent in excess of twenty minutes reviewing the indications,  risks, and potential benefits of surgery with Mr. Miceli and his wife here  in the office today.  Alternative treatment strategies have been  discussed.  He understands and accepts all potential associated risks of  surgery including, but not limited to, risk of death, stroke, myocardial  infarction, congestive heart failure, respiratory failure, pneumonia,  bleeding requiring blood transfusion, arrhythmia, infection, late  complications related to valve replacement, late prosthetic valve  deterioration and failure requiring recurrent surgery, late recurrence  of coronary artery disease.  He continues to maintain his decision that  he desires to have his valve replaced using a bioprosthetic tissue  valve.  He understands that given his relatively young age there is a  significant likelihood of late structural valve deterioration and  failure at some point during his lifetime.  All of his questions have  been addressed.  We plan to proceed with surgery on January 8.   Salvatore Decent. Cornelius Moras, M.D.  Electronically Signed   CHO/MEDQ  D:   01/22/2007  T:  01/22/2007  Job:  [id number]   cc:   Thomas C. Daleen Squibb, MD, [facility]  Theressa Millard, M.D.

## 2010-06-01 NOTE — H&P (Signed)
HISTORY AND PHYSICAL EXAMINATION   January 01, 2007   Re:  Jonathan Jones, Jonathan Jones           DOB:  25-May-1960   PRESENTING CHIEF COMPLAINT:  Exertional shortness of breath and chest  discomfort.   HISTORY OF PRESENT ILLNESS:  Patient is a 50 year old gentleman from  [place] with known history of bicuspid aortic valve and aortic  stenosis.  He was first noted to have a heart murmur around the age of  21, and since then, he has been followed carefully with the diagnosis of  bicuspid aortic valve.  His past medical history is also notable for a  history of hypertension and hyperlipidemia.  Recently, he was seen in  followup with Dr. Daleen Squibb and noted to have progressive symptoms of  exertional shortness of breath and chest discomfort over the last six  months.  He was referred for formal CPX testing to confirm that these  symptoms may be in fact related to his underlying valvular heart  disease.  This test was markedly abnormal with early signs of global  cardiac ischemia and symptomatic chest discomfort and shortness of  breath.  A 2D echocardiogram confirmed bicuspid native aortic valve with  severe aortic stenosis.  Patient subsequently underwent left and right  heart catheterization on December 26, 2006 by Dr. Gala Romney.  This  confirmed the presence of severe aortic stenosis as well as significant  single vessel coronary artery disease.  The peak and mean gradients  measured across the aortic valve were 48 and 39 mmHg respectively.  Calculated aortic valve area was estimated at 0.89 cm2.  Left  ventricular systolic function remained normal.  There was two vessel  coronary artery disease with long segment tubular 50-60% stenosis of the  left anterior descending coronary artery and 70-80% tubular stenosis of  the large diagonal branch.  Patient has now been referred for possible  elective aortic valve replacement and coronary artery bypass grafting.   REVIEW OF SYSTEMS:   GENERAL:  Patient reports normal appetite.  He has  gained 6-8 pounds in weight over the last six months due to decreased  physical activity.  He reports being 6 feet tall and 190 pounds.  CARDIAC:  Patient describes a six month history of progressive symptoms  of exertional shortness of breath with occasional mild exertional chest  tightness and chest pressure.  These symptoms never occur at rest, but  they have developed with progressive symptoms with decreased physical  activity.  The patient has occasional dizzy spells, although he denies  any syncopal episode.  He reports no episodes of PND, orthopnea, nor  lower extremity edema.  RESPIRATORY:  Notable only for exertional shortness of breath.  The  patient denies productive cough, hemoptysis, wheezing.  GASTROINTESTINAL:  Negative.  The patient has no difficulty swallowing.  He denies hematochezia, hematemesis, or melena.  His bowel function is  regular.  GENITOURINARY:  Notable for some difficulty starting and stopping his  stream.  This has been blamed on medications he has taken for migraine  headaches.  PERIPHERAL VASCULAR:  Negative.  The patient denies symptoms suggestive  of claudication.  NEUROLOGIC:  Notable only for intermittent dizzy spells that may be more  related to aortic stenosis.  The patient does have a longstanding  history of migraine headaches that have been improved in recent years on  medical treatment.  MUSCULOSKELETAL:  Negative:  The patient denies significant arthritis or  arthralgias.  PSYCHIATRIC:  Negative.  HEENT:  Negative.  The patient sees his dentist regularly, and his  dentist is well aware of his underlying aortic valve pathology.  He has  no loose teeth, painful teeth, or recent dental infection.   PAST MEDICAL HISTORY:  1. Hypertension.  2. Hyperlipidemia.   PAST SURGICAL HISTORY:  None.   FAMILY HISTORY:  Notable in that the patient's mother had a heart attack  at age 17.   SOCIAL  HISTORY:  Patient is married and lives with his wife and three of  four children here in [place].  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  He enjoys a fair amount of strenuous work-related activity.  The  patient is a nonsmoker.  He drinks alcohol occasionally, averaging 4-5  drinks per week, by report.   CURRENT MEDICATIONS:  1. Coreg.  2. Tricor.  3. Aspirin.  4. Some type of migraine prevention medication, medication unknown.      Patient did not bring his prescriptions with him, so he is unsure      of doses and schedule.   DRUG ALLERGIES:  None known.   PHYSICAL EXAMINATION:  Patient is a well-appearing gentleman who appears  his stated age in no acute distress.  Blood pressure is 124/82, pulse  75, oxygen saturation 98% on room air.  HEENT exam is unrevealing.  Patient has good dentition.  Neck is supple.  There is no cervical or  supraclavicular lymphadenopathy.  There is no jugular venous distention.  Auscultation of the chest reveals clear breath sounds which are  symmetrical bilaterally.  No wheezes or rhonchi are noted.  Cardiovascular:  Notable for regular rate and rhythm.  There is a  prominent grade 3-4/6 crescendo systolic murmur heard along the sternal  border with radiation to the neck.  No diastolic murmurs are noted.  Abdomen is soft and nontender.  There are no palpable masses.  The liver  edge is not palpable.  Bowel sounds are present.  Extremities are warm  and well perfused.  There is no lower extremity edema.  Distal pulses  are easily palpable in both lower legs in the dorsalis pedis and  posterior tibial positions.  There is no sign of significant venous  insufficiency.  The skin is clean, dry, and healthy-appearing  throughout.  Rectal and GU exams are both deferred.  Neurological  examination is grossly nonfocal and symmetrical throughout.   DIAGNOSTIC TESTS:  Cardiac  catheterization performed by Dr. Gala Romney on  December 26, 2006 is reviewed.  This confirmed the presence of severe  aortic stenosis with two vessel coronary artery disease.  There is  tubular 50-60% stenosis of the proximal to mid left anterior descending  coronary artery.  This is not high grade but probably bad enough to  warrant surgical revascularization at the time of aortic valve  replacement.  There is a more significant 70-80% stenosis of the large  first diagonal branch.  There did not appear to be significant disease  in the left circumflex coronary artery.  There is 70% stenosis in the  distal right coronary artery beyond take-off of the posterior descending  coronary artery before a small posterolateral branch.  This does  probably not require surgical revascularization.  Left ventricular  systolic function appears preserved.  The ascending aorta appears mildly  dilated.   CT angiogram performed last week is reviewed.  This demonstrates mild  enlargement of the ascending thoracic aorta  with maximum transverse  diameter of 4.2 cm.  The remainder of the aorta appears normal.   IMPRESSION:  Severe aortic stenosis with bicuspid native aortic valve  and two vessel coronary artery disease with six month history of  progressive symptoms of exertional chest discomfort, shortness of  breath, and dizzy spells.  I believe that patient would best be treated  with elective aortic valve replacement and coronary artery bypass  grafting.  There is mild enlargement of the ascending thoracic aorta,  but probably not to the size to warrant replacement of the aorta as  well.   PLAN:  I have discussed options at length with patient here in the  office today.  Alternative treatment strategies have been reviewed.  The  relative rationale for proceeding with surgery in the near future versus  continuing medical followup have been discussed.  The risks associated  with surgery have been reviewed.   Alternative surgical approaches have  been discussed as well, including and most notably, the relative risks  and benefits of the use of a mechanical prosthesis with associated long-  term need for anticoagulation versus use of some type of bioprosthetic  tissue valve and the associated potential for late structure valve  deterioration and failure.  All of his questions have been addressed.  At this point, patient seems convinced that he would likely prefer that  bioprosthetic tissue valve be utilized at the time of surgery to avoid  the need for long-term anticoagulation with Coumadin.  He understands  that at his age, this will come with significant likelihood for the  possibility of late structural valve deterioration and failure,  potentially requiring repeat surgery in the distant future.  We will  tentatively plan to proceed with surgery on Thursday, July 8th.  Patient  will return to the office for followup on January 5th for final  preoperative counseling prior to surgery.   Salvatore Decent. Cornelius Moras, M.D.  Electronically Signed   CHO/MEDQ  D:  01/01/2007  T:  01/01/2007  Job:  [id number]   cc:   Thomas C. Daleen Squibb, MD, [facility]  Bevelyn Buckles. Bensimhon, MD  Theressa Millard, M.D.

## 2010-06-01 NOTE — Assessment & Plan Note (Signed)
[facility]                            CARDIOLOGY OFFICE NOTE   NAME:Doolan, KAGE WILLMANN                       MRN:          [id number]  DATE:02/07/2007                            DOB:          [birth year]    Merton Border comes in today after having aortic valve replacement and coronary  artery bypass grafting x3 by Dr. Tressie Stalker.  This was done on  January 25, 2007.  At that time, left internal mammary graft was placed  to the distal LAD, a vein graft to the first diagonal, a vein graft to  the posterior descending vessel.  He also had a 25-mm Edwards Magna  pericardial valve placed.  There were no postoperative complications.   He did have a pericardial friction rub that was asymptomatic postop.   Since discharge, he has lost 17 pounds.  His appetite is slowly  returning.  He has not drunk any alcohol and is really being careful  with fat and carbohydrates.   CURRENT MEDICATIONS:  1. Carvedilol 12.5 mg p.o. b.i.d.  2. Doxepin 25 mg 1 daily.  3. Aspirin 325 mg a day.  4. He finished Lasix 40 mg for a week with potassium supplement.  He      had some postoperative edema.  5. Iron.   His biggest complaint is a little bit of chest wall soreness and just  being anxious to get out and start doing things.   PHYSICAL EXAMINATION:  VITAL SIGNS:  His blood pressure today is 140/96,  his pulse is 94 and regular.  His EKG shows sinus rhythm.  Weight is  down to 187 which on my scales is about 4 pounds less than last visit.  HEENT:  He is slightly pale.  PERRLA.  Extraocular movements intact.  Sclerae clear.  Facial symmetry is normal.  NECK:  Carotid upstrokes were equal bilaterally without obvious bruits.  He has soft systolic sounds at the base of his neck.  Thyroid is not  enlarged.  LUNGS:  Clear throughout.  There is no dullness to percussion.  HEART:  Nondisplaced PMI.  He has a normal S1 and S2 with a soft  systolic murmur along the left sternal border.   There is no diastolic  murmur.  ABDOMEN:  Soft with good bowel sounds.  EXTREMITIES:  No edema.  Pulses are intact.  NEUROLOGIC:  Intact.   Electrocardiogram shows normal sinus rhythm, rate of 96 beats per  minute.  He has some LVH with ST-segment changes with an occasional PVC.  Since his surgery, there are now ST-segment changes laterally and  inferiorly.   ASSESSMENT AND PLAN:  I think Merton Border is doing well.  He will be seeing  Dr. Cornelius Moras tomorrow.   We have talked about being aggressive with his cholesterol.  In the  past, his primary target has been triglycerides with a fibrate.  At this  point in time, we will start a statin in the form of Lipitor 20 mg a  day.  LDL goal is 60 or less.  We may need to place him on low-dose of a  fibrate as well depending on how he does with his diet.   I have also increased his Carvedilol to 25 mg p.o. b.i.d. and will  switch him over to the CR derivative for convenience.  Will continue  with aspirin.   I will plan on seeing him back in about 4-6 weeks.     Thomas C. Daleen Squibb, MD, [facility]  Electronically Signed    TCW/MedQ  DD: 02/07/2007  DT: 02/08/2007  Job #: [id number]   cc:   Salvatore Decent. Cornelius Moras, M.D.  Theressa Millard, M.D.

## 2010-06-01 NOTE — Assessment & Plan Note (Signed)
[facility]                            CARDIOLOGY OFFICE NOTE   NAME:Jonathan Jones                        MRN:          [id number]  DATE:09/29/2006                            DOB:          February 03, 1960    Jonathan Jones comes in today for close followup of his bicuspid aortic valve,  moderate aortic stenosis with a mean gradient of 35 mmHg with a valve  area of 1.19 sq cm as of March 2008, moderate aortic root dilation by CT  scan March 2008 of 4.4 x 4.3, hypertension.   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 He is seeing an allergist next week which he  thinks that is what it is.   He is currently on:  1. Aspirin 81 mg a day.  2. Tricor 160 daily.  3. Coreg 12.5 b.i.d.  4. Doxepin HCL for his migraines 25 mg 2 at bedtime. This has also      helped his sleep.   He is having no chest pain with exertion, no syncope. He has felt very  tired at times working.   His blood pressure today is 124/86, his pulse is 61 and regular. His EKG  shows normal sinus rhythm with LVH and minimal first degree AV block at  214 milliseconds. This is slightly increased from before. His weight is  191.  HEENT: Unchanged, carotid upstrokes are equal bilaterally with referred  sound. There is no JVD. Thyroid is not enlarged. Trachea is midline.  LUNGS: Clear.  HEART: Reveals an aortic stenosis murmur. S2 does split. There is no  aortic insufficiency appreciated. There is no gallop.  ABDOMINAL EXAM: Soft, good bowel sounds.  EXTREMITIES: Reveal no edema. Pulses are intact.   ASSESSMENT/PLAN:  I am pleased that Jonathan Jones's blood pressure is under  good control as is his heart rate. He seems to be sleeping better and  having less migraines and actually looks good overall today. I am  concerned about his dyspnea on exertion which could be somewhat from his  aortic stenosis.   He will give me a call after he sees the  allergist. If he is not better  we will go ahead and repeat a 2D echocardiogram prior to an annual  recheck which will be in March 2009. We will also do a CT in March 2009.    Thomas C. Daleen Squibb, MD, [facility]  Electronically Signed   TCW/MedQ  DD: 09/29/2006  DT: 09/29/2006  Job #: [id number]   cc:   Theressa Millard, M.D.

## 2010-06-04 NOTE — Assessment & Plan Note (Signed)
[facility]                            CARDIOLOGY OFFICE NOTE   Jonathan Jones, Jonathan Jones                        MRN:          [id number]  DATE:03/21/2006                            DOB:          22-Feb-1960    ADDENDUM     Maisie Fus C. Daleen Squibb, MD, [facility]  Electronically Signed    TCW/MedQ  DD: 03/24/2006  DT: 03/24/2006  Job #: [id number]

## 2010-06-04 NOTE — Assessment & Plan Note (Signed)
[facility]                            CARDIOLOGY OFFICE NOTE   NAME:DICKLerry Liner                        MRN:          [id number]  DATE:04/21/2006                            DOB:          [birth year]    Jonathan Jones comes in today for followup from March 24, 2006.  He has had no  further chest pain.   Followup 2D echocardiogram demonstrated moderate aortic stenosis with a  mean gradient of 35 mmHg.  His estimated valve area is 1.19 sq cm.  He  had moderately reduced aortic valve leaflet excursion.  The valve is  obviously bicuspid.  He had mild increase in pulmonary pressures.  There  was no mitral regurgitation.  There was very little aortic valve  regurgitation.   His aorta was reassessed with a CT.  This demonstrated mild aneurysmal  dilatation of the ascending aorta measuring 4.4 x 4.3 cm at maximum  dimensions in the transverse plane.  This has increased from 3.5 to 4 cm  6 years ago.   He is feeling well.  He is on Coreg 12.5 b.i.d., aspirin 81 mg daily,  and TriCor 160 every day.   His blood pressure is 118/89.  His pulse is 56 and regular.  His weight  is 194.  The rest of the examination is unchanged.   I had a long talk with Jonathan Jones today.  I have shown him on a model what  is happening and why it is happening with his aortic valve and his  ascending aorta.  It is possible he may need surgery within the next 3  to 5 years.  He is totally asymptomatic.  We have followed him up every  6 months clinically, and every year we will do a CT and 2D echo.     Thomas C. Daleen Squibb, MD, [facility]  Electronically Signed    TCW/MedQ  DD: 04/21/2006  DT: 04/21/2006  Job #: [id number]   cc:   Theressa Millard, M.D.

## 2010-06-04 NOTE — Assessment & Plan Note (Signed)
[facility]                              CARDIOLOGY OFFICE NOTE   NAME:Jonathan Jones                        MRN:          [id number]  DATE:10/12/2005                            DOB:          08-Oct-1960    Jonathan Jones comes in today for followup of the following problems:  1. Bicuspid aortic valve with history of mild aortic insufficiency and      mild aortic stenosis.  He is asymptomatic by careful history.  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  A 2-D echocardiogram on September 13, 2005 demonstrated increase      in his main gradient from 16 to 28 mmHg.  He has mild aortic      insufficiency.  This was consistent, however, with moderate to severe      aortic valve stenosis.  I think it is more in the moderate range.  He      has mild aortic root dilatation.  He has normal left ventricular      function, except for mild left ventricular dilatation.  2. Mixed hyperlipidemia.  His lipids are being followed by Dr. Earl Gala on      Tricor 160.  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  He has had a      great response with normalization of the triglycerides and increase in      his HDL from 31-48.  However, his LDL is increased to 126, so his total      is 193.  3. He has been plagued with migraine headaches recently.  He is having 2      or 3 a week.  On occasion, he has taken Maxalt to control these.  4. On his last visit, his blood pressure was under better control, and his      heart rate was down because of a good exercise program.  We stopped his      Coreg.  His blood pressure has been up recently.  Dr. Earl Gala re-      prescribed Coreg 12.5 b.i.d.  He is to start this when he gets it      filled.   PHYSICAL EXAMINATION:  VITAL SIGNS:  His blood pressure today is 138/98,  pulse 65 and regular.  His weight is 196.  HEENT:  Somewhat ruddy completion.  Pupils equal, round and reactive to  light.   Extraocular movements intact.  Sclerae is slightly injected.  NECK:  Carotid upstrokes are equal bilaterally without any delayed pulses.  He does have systolic sounds bilaterally from his aortic valve.  Thyroid is  not enlarged.  Trachea is midline.  LUNGS:  Clear.  HEART:  A non-displaced PMI.  There is no sustained PMI.  He has a 3/6  systolic murmur consistent with aortic stenosis.  His S2 does split.  There  is mild aortic insufficiency murmur noted.  There is no gallop.  ABDOMEN:  Soft with good bowel sounds with no midline bruit.  There is no  hepatomegaly.  EXTREMITIES:  No clubbing, cyanosis, or edema.  Pulses are intact.   ELECTROCARDIOGRAM:  The electrocardiogram shows sinus rhythm with moderate  voltage criteria for LVH.  This is largely unchanged.   I have had a long talk with Jonathan Jones today.  I have warned him about the  symptoms of increased dyspnea on exertion, presyncope or syncope with  exertion or angina.  If he has any of these, he is to let me know as soon as  possible.  I will repeat an echocardiogram in February for careful  followup.  He will start his Coreg.  He will watch his blood pressure.  I  have advised him not to take any more Maxalt than necessary.  I will see him  back again in 6 months.       Thomas C. Daleen Squibb, MD, [facility]     TCW/MedQ  DD:  10/12/2005  DT:  10/14/2005  Job #:  [id number]   cc:   Theressa Millard, M.D.  Clabe Seal. Meryl Crutch, M.D.

## 2010-06-04 NOTE — Assessment & Plan Note (Signed)
[facility]                            CARDIOLOGY OFFICE NOTE   NAME:DICKLerry Liner                        MRN:          [id number]  DATE:03/24/2006                            DOB:          04-04-1960    Jonathan Jones comes in today after calling me this morning with a sharp  discomfort in the mid-left lateral chest.  After this event, he had  several more sequences of pain.  he subsequently became lightheaded,  dizzy, and just felt kind of weak.  His office staff thought his face  looked red.  He denied any anterior chest pain.  No discomfort in the  midscapular area as well.  He denied any shortness of breath,  diaphoresis, or nausea or vomiting.   He just started Neurontin for migraine headaches with Dr. Meryl Crutch about  3 weeks ago.  He takes it at night.   He has never had any symptoms like this before.  He is not one to  complain, and I brought him in for careful evaluation.   PROBLEM LIST:  1. Bicuspid aortic valve, with moderate aortic stenosis and mild      aortic insufficiency.  This was by echocardiogram in September 14, 2006.  2. He has a mixed hyperlipidemia, followed by Dr. Theressa Millard on      [facility] [address].  His labs have been good.  3. He also has a history of hypertension, and he is on Coreg and seems      to be taking it on a regular basis.   PHYSICAL EXAMINATION:  GENERAL:  He is in no acute distress today.  VITAL SIGNS:  His blood pressure is 138/96.  I rechecked it in both  arms, and it was 130/about 82.  There was no discrepancy.  Pulses were  equal in the radial area as well as in the carotids.  His pulse rate was  65.  HEENT:  Normocephalic, atraumatic.  PERRLA.  Extraocular movements  intact.  Facial symmetry is normal.  NECK:  Carotid upstrokes are equal bilaterally, without bruits.  There  is no JVD.  Thyroid is not enlarged.  LUNGS:  Clear anterior and posterior.  No rubs were present.  CARDIOVASCULAR:  His PMI is  nondisplaced.  he has a normal S1, and a  normal S2.  His S2 does split with inspiration.  he has a 3/6 systolic  murmur, heard best at the right upper sternal Jones.  He has a mild  diastolic murmur that has not changed.  In his midscapula, there was no  bruit.  ABDOMEN:  Soft.  There is no midline bruit.  There is no tenderness.  There is no hepatomegaly.  Bowel sounds are present.  EXTREMITIES:  There are no cyanosis, clubbing, or edema.  Pulses are  intact.   Because of the dizziness, we did orthostatics, which showed lying blood  pressure 120/80, heart rate 65.  Sitting was 124/88, with a pulse of 66,  and standing after 2 minutes was 132/83, pulse 69.  He had no symptoms.  His EKG shows normal sinus rhythm, with voltage criteria for left  ventricular hypertrophy.  He has nonspecific ST segment changes.  There  has been no change.   ASSESSMENT AND PLAN:  I have carefully evaluated Jonathan Jones today and see no  obvious cardiovascular cause for his discomfort.  His lungs are also  clear.  He looks remarkably well.  He is not orthostatic.   The only diagnosis I can figure is some muscular discomfort.  He is not  tender there, but certainly it does not rule it out.  The dizziness  could be related to Neurontin, and I have asked him to check with Dr.  Meryl Crutch about that if he continues to have problems.   I am going to arrange for him to have the following done before he sees  me in 3 weeks, which was his scheduled visit:  1. Chest CT with contrast.  We have not done one since 2002.  2. A 2D echocardiogram.   When he comes back, we will change his Coreg to Coreg SR, which he did  not know was available.   I am on call this weekend.  He will call me if he is having any more  symptoms.     Thomas C. Daleen Squibb, MD, [facility]  Electronically Signed    TCW/MedQ  DD: 03/24/2006  DT: 03/24/2006  Job #: [id number]   cc:   Clabe Seal. Meryl Crutch, M.D.  Theressa Millard, M.D.

## 2010-07-20 ENCOUNTER — Encounter: Payer: Self-pay | Admitting: Cardiology

## 2010-10-07 LAB — POCT I-STAT 3, ART BLOOD GAS (G3+)
Acid-base deficit: 1
Acid-base deficit: 3 — ABNORMAL HIGH
Bicarbonate: 22.7
Bicarbonate: 22.9
Bicarbonate: 24.4 — ABNORMAL HIGH
O2 Saturation: 100
O2 Saturation: 71
Operator id: 299391
Operator id: 3342
Operator id: 3342
Patient temperature: 37.4
TCO2: 26
pCO2 arterial: 35.9
pCO2 arterial: 36
pH, Arterial: 7.332 — ABNORMAL LOW
pH, Arterial: 7.404
pH, Arterial: 7.443
pO2, Arterial: 176 — ABNORMAL HIGH
pO2, Arterial: 80

## 2010-10-07 LAB — I-STAT EC8
Acid-base deficit: 3 — ABNORMAL HIGH
Chloride: 107
HCT: 21 — ABNORMAL LOW
Hemoglobin: 7.1 — CL
Potassium: 4.4
Sodium: 139
pH, Arterial: 7.358

## 2010-10-07 LAB — CBC
HCT: 22.8 — ABNORMAL LOW
HCT: 23.5 — ABNORMAL LOW
HCT: 23.7 — ABNORMAL LOW
HCT: 25.8 — ABNORMAL LOW
HCT: 40.9
Hemoglobin: 14.1
Hemoglobin: 7.9 — CL
Hemoglobin: 8.1 — ABNORMAL LOW
Hemoglobin: 8.3 — ABNORMAL LOW
MCV: 87.4
MCV: 87.6
MCV: 89.1
Platelets: 151
Platelets: 158
RBC: 2.56 — ABNORMAL LOW
RBC: 2.95 — ABNORMAL LOW
RBC: 4.65
RDW: 12.4
RDW: 12.5
RDW: 12.7
WBC: 11 — ABNORMAL HIGH
WBC: 11.1 — ABNORMAL HIGH
WBC: 11.7 — ABNORMAL HIGH
WBC: 12.8 — ABNORMAL HIGH
WBC: 13.3 — ABNORMAL HIGH
WBC: 7.4

## 2010-10-07 LAB — BASIC METABOLIC PANEL
BUN: 15
BUN: 38 — ABNORMAL HIGH
Calcium: 7.5 — ABNORMAL LOW
Calcium: 7.7 — ABNORMAL LOW
Chloride: 96
Creatinine, Ser: 1
GFR calc Af Amer: 60 — ABNORMAL LOW
GFR calc non Af Amer: 49 — ABNORMAL LOW
GFR calc non Af Amer: 55 — ABNORMAL LOW
GFR calc non Af Amer: >60
Potassium: 4.2
Potassium: 4.2
Potassium: 5.3 — ABNORMAL HIGH
Sodium: 130 — ABNORMAL LOW
Sodium: 132 — ABNORMAL LOW

## 2010-10-07 LAB — MAGNESIUM
Magnesium: 2.5
Magnesium: 2.7 — ABNORMAL HIGH

## 2010-10-07 LAB — POCT I-STAT 4, (NA,K, GLUC, HGB,HCT)
Glucose, Bld: 87
Glucose, Bld: 88
HCT: 22 — ABNORMAL LOW
HCT: 25 — ABNORMAL LOW
Hemoglobin: 12.9 — ABNORMAL LOW
Hemoglobin: 7.5 — CL
Hemoglobin: 8.5 — ABNORMAL LOW
Operator id: 299391
Operator id: 3342
Potassium: 4.2
Potassium: 4.2
Potassium: 4.5
Sodium: 132 — ABNORMAL LOW
Sodium: 133 — ABNORMAL LOW
Sodium: 136
Sodium: 137

## 2010-10-07 LAB — URINALYSIS, ROUTINE W REFLEX MICROSCOPIC
Bilirubin Urine: NEGATIVE
Bilirubin Urine: NEGATIVE
Glucose, UA: 100 — AB
Ketones, ur: NEGATIVE
Nitrite: NEGATIVE
Nitrite: NEGATIVE
Specific Gravity, Urine: 1.016
Specific Gravity, Urine: 1.024
Urobilinogen, UA: 0.2
pH: 5.5
pH: 7

## 2010-10-07 LAB — POCT I-STAT GLUCOSE
Glucose, Bld: 109 — ABNORMAL HIGH
Glucose, Bld: 94
Operator id: 3342

## 2010-10-07 LAB — URINE MICROSCOPIC-ADD ON

## 2010-10-07 LAB — COMPREHENSIVE METABOLIC PANEL
Albumin: 4
Alkaline Phosphatase: 56
BUN: 18
CO2: 23
Chloride: 106
Creatinine, Ser: 0.98
GFR calc non Af Amer: >60
Glucose, Bld: 103 — ABNORMAL HIGH
Potassium: 4.2
Total Bilirubin: 0.6

## 2010-10-07 LAB — URINE CULTURE: Culture: NO GROWTH

## 2010-10-07 LAB — APTT: aPTT: 31

## 2010-10-07 LAB — BLOOD GAS, ARTERIAL
Bicarbonate: 26.3 — ABNORMAL HIGH
FIO2: 0.21
Patient temperature: 98.6
TCO2: 27.7
pH, Arterial: 7.396
pO2, Arterial: 85.4

## 2010-10-07 LAB — ABO/RH: ABO/RH(D): O NEG

## 2010-10-07 LAB — HEMOGLOBIN AND HEMATOCRIT, BLOOD: Hemoglobin: 8.9 — ABNORMAL LOW

## 2010-10-07 LAB — HEMOGLOBIN A1C: Mean Plasma Glucose: 111

## 2010-10-07 LAB — PLATELET COUNT: Platelets: 184

## 2010-10-07 LAB — TYPE AND SCREEN: Antibody Screen: NEGATIVE

## 2010-10-25 LAB — POCT I-STAT 3, VENOUS BLOOD GAS (G3P V)
Acid-Base Excess: 2
Acid-Base Excess: 3 — ABNORMAL HIGH
Bicarbonate: 28.9 — ABNORMAL HIGH
O2 Saturation: 70
O2 Saturation: 76
Operator id: 141321
TCO2: 30
pH, Ven: 7.384 — ABNORMAL HIGH
pO2, Ven: 39

## 2010-10-25 LAB — POCT I-STAT 3, ART BLOOD GAS (G3+)
Acid-Base Excess: 2
Bicarbonate: 26.7 — ABNORMAL HIGH
O2 Saturation: 97
TCO2: 28
pCO2 arterial: 43
pO2, Arterial: 89

## 2010-10-27 LAB — CBC
MCV: 86.8
Platelets: 318
WBC: 7.8

## 2010-11-01 ENCOUNTER — Telehealth: Payer: Self-pay | Admitting: *Deleted

## 2010-11-01 NOTE — Telephone Encounter (Signed)
Received atorvastatin refill. Pt has not been seen in quite a while.  Pt says he does not need refill at this time Pt states recent cholesterol labs checked by pcp Dr. Earl Gala Appt made with Dr. Daleen Squibb for 12/23/10 Mylo Red RN

## 2010-11-15 ENCOUNTER — Other Ambulatory Visit: Payer: Self-pay | Admitting: *Deleted

## 2010-11-15 MED ORDER — ATORVASTATIN CALCIUM 20 MG PO TABS: 10.0000 mg | ORAL_TABLET | Freq: Every day | ORAL | Status: DC

## 2010-12-23 ENCOUNTER — Encounter: Payer: Self-pay | Admitting: Cardiology

## 2010-12-23 ENCOUNTER — Ambulatory Visit: Payer: Self-pay | Admitting: Cardiology

## 2011-01-20 ENCOUNTER — Ambulatory Visit: Payer: Self-pay | Admitting: Cardiology

## 2011-01-21 ENCOUNTER — Telehealth: Payer: Self-pay | Admitting: *Deleted

## 2011-01-21 ENCOUNTER — Other Ambulatory Visit: Payer: Self-pay

## 2011-01-21 MED ORDER — CARVEDILOL PHOSPHATE ER 80 MG PO CP24: 80.0000 mg | ORAL_CAPSULE | Freq: Every day | ORAL | Status: DC

## 2011-01-21 NOTE — Telephone Encounter (Signed)
He has an appt to see Dr. Daleen Squibb on 02/01/2011.  Call him in enough to get him to his appt.  Thx.

## 2011-01-21 NOTE — Telephone Encounter (Signed)
Fu call °Pt returning your call  °

## 2011-01-25 ENCOUNTER — Encounter: Payer: Self-pay | Admitting: Cardiology

## 2011-02-01 ENCOUNTER — Ambulatory Visit (INDEPENDENT_AMBULATORY_CARE_PROVIDER_SITE_OTHER): Payer: 59 | Admitting: Cardiology

## 2011-02-01 ENCOUNTER — Encounter: Payer: Self-pay | Admitting: Cardiology

## 2011-02-01 VITALS — BP 144/88 | HR 59 | Resp 18 | Ht 72.0 in | Wt 189.0 lb

## 2011-02-01 DIAGNOSIS — I2510 Atherosclerotic heart disease of native coronary artery without angina pectoris: Secondary | ICD-10-CM

## 2011-02-01 DIAGNOSIS — I259 Chronic ischemic heart disease, unspecified: Secondary | ICD-10-CM

## 2011-02-01 DIAGNOSIS — E785 Hyperlipidemia, unspecified: Secondary | ICD-10-CM

## 2011-02-01 DIAGNOSIS — I359 Nonrheumatic aortic valve disorder, unspecified: Secondary | ICD-10-CM

## 2011-02-01 DIAGNOSIS — I10 Essential (primary) hypertension: Secondary | ICD-10-CM

## 2011-02-01 MED ORDER — SILDENAFIL CITRATE 50 MG PO TABS: 50.0000 mg | ORAL_TABLET | Freq: Every day | ORAL | Status: AC | PRN

## 2011-02-01 NOTE — Patient Instructions (Signed)
Your physician wants you to follow-up in:  12 months.  You will receive a reminder letter in the mail two months in advance. If you don't receive a letter, please call our office to schedule the follow-up appointment.   

## 2011-02-01 NOTE — Assessment & Plan Note (Signed)
Stable. Continue secondary preventive therapy. Will increase his atorvastatin 40 mg with repeat labs in 6-8 weeks. His goal LDL is less than 70.

## 2011-02-01 NOTE — Progress Notes (Signed)
HPI Jonathan Jones comes in today for evaluation and management his history of coronary artery disease and history of aortic stenosis status post aortic valve replacement and bypass surgery.  He has no complaints. 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 He denies any chest pain, palpitations, shortness of breath, presyncope or syncope.  He's been noncompliant with his diet. He had lipids drawn in October which I am trying to get from Dr. Earl Gala. The last lipids I have ordered 2011 and his LDL is not at goal. The HDL was also low at 35.  Past Medical History  Diagnosis Date  . Aortic stenosis   . Hypertension     Unspecified  . Mixed hyperlipidemia   . Migraine headache   . Iron deficiency anemia secondary to blood loss (chronic)   . Heart murmur     Current Outpatient Prescriptions  Medication Sig Dispense Refill  . aspirin EC 325 MG tablet Take 325 mg by mouth daily.        Marland Kitchen atorvastatin (LIPITOR) 20 MG tablet Take 0.5 tablets (10 mg total) by mouth at bedtime.  90 tablet  3  . carvedilol (COREG CR) 80 MG 24 hr capsule Take 1 capsule (80 mg total) by mouth daily.  15 capsule  0  . doxepin (SINEQUAN) 25 MG capsule Take 50 mg by mouth at bedtime.        . sildenafil (VIAGRA) 50 MG tablet Take 1 tablet (50 mg total) by mouth daily as needed for erectile dysfunction.  10 tablet  1    No Known Allergies  Family History  Problem Relation Age of Onset  . Coronary artery disease Other   . Cancer Other 91  . Alzheimer's disease Father   . Other Sister 37    brain tumor    History   Social History  . Marital Status: Married    Spouse Name: N/A    Number of Children: N/A  . Years of Education: N/A   Occupational History  . Full time    Social History Main Topics  . Smoking status: Never Smoker   . Smokeless tobacco: Not on file   Comment: Tobacco use-no  . Alcohol Use: Yes     Occasionally  . Drug Use: No  . Sexually Active: Not on file   Other Topics Concern  . Not  on file   Social History Narrative  . No narrative on file    ROS ALL NEGATIVE EXCEPT THOSE NOTED IN HPI  PE  General Appearance: well developed, well nourished in no acute distress HEENT: symmetrical face, PERRLA, good dentition  Neck: no JVD, thyromegaly, or adenopathy, trachea midline Chest: symmetric without deformity Cardiac: PMI non-displaced, RRR, normal S1, S2, no gallop, soft systolic murmur at the sternal Jones, S2 splits, no diastolic component, radiates to the base of the neck, no carotid bruits Lung: clear to ausculation and percussion Vascular: all pulses full without bruits  Abdominal: nondistended, nontender, good bowel sounds, no HSM, no bruits Extremities: no cyanosis, clubbing or edema, no sign of DVT, no varicosities  Skin: normal color, no rashes Neuro: alert and oriented x 3, non-focal Pysch: normal affect  EKG Sinus bradycardia, otherwise normal EKG and an BMET    Component Value Date/Time   NA 138 09/04/2008 0000   K 4.1 09/04/2008 0000   CL 102 09/04/2008 0000   CO2 31 09/04/2008 0000   GLUCOSE 91 09/04/2008 0000   BUN 17 09/04/2008 0000   CREATININE 0.9 09/04/2008  0000   CALCIUM 9.2 09/04/2008 0000   GFRNONAA 94.77 09/04/2008 0000   GFRAA  Value: >60        The eGFR has been calculated using the MDRD equation. This calculation has not been validated in all clinical situations. eGFR's persistently <60 mL/min signify possible Chronic Kidney Disease. 01/28/2007 0415    Lipid Panel     Component Value Date/Time   CHOL 152 09/15/2008 0843   TRIG 156.0* 09/15/2008 0843   HDL 36.00* 09/15/2008 0843   CHOLHDL 4 09/15/2008 0843   VLDL 31.2 09/15/2008 0843   LDLCALC 85 09/15/2008 0843    CBC    Component Value Date/Time   WBC 8.4 09/04/2008 0000   RBC 5.00 09/04/2008 0000   HGB 15.3 09/04/2008 0000   HCT 44.4 09/04/2008 0000   PLT 197.0 09/04/2008 0000   MCV 88.8 09/04/2008 0000   MCHC 34.5 09/04/2008 0000   RDW 11.5 09/04/2008 0000   LYMPHSABS 1.8 09/04/2008  0000   MONOABS 0.5 09/04/2008 0000   EOSABS 0.3 09/04/2008 0000   BASOSABS 0.0 09/04/2008 0000

## 2011-02-01 NOTE — Assessment & Plan Note (Signed)
Stable status post aortic valve replacement. Continue observation and medical therapy.

## 2011-02-01 NOTE — Assessment & Plan Note (Signed)
I just received his latest lipids. These were drawn on October 18, 2010. Total cholesterol 178, triglycerides 145, LDL 103, HDL 57. Will increase atorvastatin 40 mg q.h.s. With an LDL goal of less than 70.

## 2011-02-02 ENCOUNTER — Other Ambulatory Visit: Payer: Self-pay | Admitting: *Deleted

## 2011-02-02 MED ORDER — ATORVASTATIN CALCIUM 40 MG PO TABS: 40.0000 mg | ORAL_TABLET | Freq: Every day | ORAL | Status: DC

## 2011-02-10 ENCOUNTER — Telehealth: Payer: Self-pay | Admitting: *Deleted

## 2011-02-10 DIAGNOSIS — I10 Essential (primary) hypertension: Secondary | ICD-10-CM

## 2011-02-10 DIAGNOSIS — I350 Nonrheumatic aortic (valve) stenosis: Secondary | ICD-10-CM

## 2011-02-10 MED ORDER — CARVEDILOL PHOSPHATE ER 80 MG PO CP24: 80.0000 mg | ORAL_CAPSULE | Freq: Every day | ORAL | Status: DC

## 2011-02-10 NOTE — Telephone Encounter (Signed)
Dr. Daleen Squibb called the office stating the patient needed a refill on his coreg. I called the patient to verify his dose and quantity. He is on coreg cr 80 mg once daily. He would like # 30 tablets sent in to [facility] on [address].

## 2012-02-03 ENCOUNTER — Encounter: Payer: Self-pay | Admitting: Cardiology

## 2012-02-14 ENCOUNTER — Other Ambulatory Visit: Payer: Self-pay | Admitting: *Deleted

## 2012-02-14 DIAGNOSIS — I10 Essential (primary) hypertension: Secondary | ICD-10-CM

## 2012-02-14 DIAGNOSIS — I350 Nonrheumatic aortic (valve) stenosis: Secondary | ICD-10-CM

## 2012-02-14 MED ORDER — ATORVASTATIN CALCIUM 40 MG PO TABS: 40.0000 mg | ORAL_TABLET | Freq: Every day | ORAL | Status: DC

## 2012-02-14 MED ORDER — CARVEDILOL PHOSPHATE ER 80 MG PO CP24: 80.0000 mg | ORAL_CAPSULE | Freq: Every day | ORAL | Status: DC

## 2012-02-21 ENCOUNTER — Ambulatory Visit: Payer: 59 | Admitting: Cardiology

## 2012-04-11 ENCOUNTER — Other Ambulatory Visit: Payer: Self-pay | Admitting: Emergency Medicine

## 2012-04-11 MED ORDER — DOXEPIN HCL 25 MG PO CAPS: 25.0000 mg | ORAL_CAPSULE | Freq: Every day | ORAL | Status: DC

## 2012-04-16 ENCOUNTER — Ambulatory Visit (INDEPENDENT_AMBULATORY_CARE_PROVIDER_SITE_OTHER): Payer: 59 | Admitting: Cardiology

## 2012-04-16 ENCOUNTER — Encounter: Payer: Self-pay | Admitting: Cardiology

## 2012-04-16 VITALS — BP 126/78 | HR 62 | Ht 72.0 in | Wt 188.0 lb

## 2012-04-16 DIAGNOSIS — I259 Chronic ischemic heart disease, unspecified: Secondary | ICD-10-CM

## 2012-04-16 DIAGNOSIS — I10 Essential (primary) hypertension: Secondary | ICD-10-CM

## 2012-04-16 DIAGNOSIS — I2510 Atherosclerotic heart disease of native coronary artery without angina pectoris: Secondary | ICD-10-CM

## 2012-04-16 DIAGNOSIS — I359 Nonrheumatic aortic valve disorder, unspecified: Secondary | ICD-10-CM

## 2012-04-16 MED ORDER — DOXEPIN HCL 50 MG PO CAPS: 50.0000 mg | ORAL_CAPSULE | Freq: Every day | ORAL | Status: DC

## 2012-04-16 NOTE — Progress Notes (Signed)
HPI Jonathan Jones comes in today for evaluation and management his coronary artery disease, history bypass surgery, and aortic valve replacement for aortic stenosis and bicuspid aortic valve.  He looks remarkably good and remains very active. He denies any chest pain, angina, presyncope, palpitations. He's being compliant with his medications. Laboratory data recently checked by Dr. Earl Gala her primary care. I reviewed those with the patient today. Other than low HDL, his lipids are at goal.  Past Medical History  Diagnosis Date  . Aortic stenosis   . Hypertension     Unspecified  . Mixed hyperlipidemia   . Migraine headache   . Iron deficiency anemia secondary to blood loss (chronic)   . Heart murmur     Current Outpatient Prescriptions  Medication Sig Dispense Refill  . aspirin EC 325 MG tablet Take 325 mg by mouth daily.        Marland Kitchen atorvastatin (LIPITOR) 40 MG tablet Take 1 tablet (40 mg total) by mouth daily.  30 tablet  5  . carvedilol (COREG CR) 80 MG 24 hr capsule Take 1 capsule (80 mg total) by mouth daily.  30 capsule  5  . doxepin (SINEQUAN) 50 MG capsule Take 1 capsule (50 mg total) by mouth daily.  30 capsule  0   No current facility-administered medications for this visit.    No Known Allergies  Family History  Problem Relation Age of Onset  . Coronary artery disease Other   . Cancer Other 91  . Alzheimer's disease Father   . Other Sister 69    brain tumor    History   Social History  . Marital Status: Married    Spouse Name: N/A    Number of Children: N/A  . Years of Education: N/A   Occupational History  . Full time    Social History Main Topics  . Smoking status: Never Smoker   . Smokeless tobacco: Not on file     Comment: Tobacco use-no  . Alcohol Use: Yes     Comment: Occasionally  . Drug Use: No  . Sexually Active: Not on file   Other Topics Concern  . Not on file   Social History Narrative  . No narrative on file    ROS ALL NEGATIVE EXCEPT  THOSE NOTED IN HPI  PE  General Appearance: well developed, well nourished in no acute distress HEENT: symmetrical face, PERRLA, good dentition  Neck: no JVD, thyromegaly, or adenopathy, trachea midline Chest: symmetric without deformity Cardiac: PMI non-displaced, RRR, normal S1, prosthetic S2, no gallop, soft systolic murmur left and right upper sternal Jones. Radiates to the base the neck. Lung: clear to ausculation and percussion Vascular: all pulses full without bruits  Abdominal: nondistended, nontender, good bowel sounds, no HSM, no bruits Extremities: no cyanosis, clubbing or edema, no sign of DVT, no varicosities  Skin: normal color, no rashes Neuro: alert and oriented x 3, non-focal Pysch: normal affect  EKG Sinus rhythm first-degree block, otherwise normal EKG  BMET    Component Value Date/Time   NA 138 09/04/2008 0000   K 4.1 09/04/2008 0000   CL 102 09/04/2008 0000   CO2 31 09/04/2008 0000   GLUCOSE 91 09/04/2008 0000   BUN 17 09/04/2008 0000   CREATININE 0.9 09/04/2008 0000   CALCIUM 9.2 09/04/2008 0000   GFRNONAA 94.77 09/04/2008 0000   GFRAA  Value: >60        The eGFR has been calculated using the MDRD equation. This calculation has not been  validated in all clinical situations. eGFR's persistently <60 mL/min signify possible Chronic Kidney Disease. 01/28/2007 0415    Lipid Panel     Component Value Date/Time   CHOL 152 09/15/2008 0843   TRIG 156.0* 09/15/2008 0843   HDL 36.00* 09/15/2008 0843   CHOLHDL 4 09/15/2008 0843   VLDL 31.2 09/15/2008 0843   LDLCALC 85 09/15/2008 0843    CBC    Component Value Date/Time   WBC 8.4 09/04/2008 0000   RBC 5.00 09/04/2008 0000   HGB 15.3 09/04/2008 0000   HCT 44.4 09/04/2008 0000   PLT 197.0 09/04/2008 0000   MCV 88.8 09/04/2008 0000   MCHC 34.5 09/04/2008 0000   RDW 11.5 09/04/2008 0000   LYMPHSABS 1.8 09/04/2008 0000   MONOABS 0.5 09/04/2008 0000   EOSABS 0.3 09/04/2008 0000   BASOSABS 0.0 09/04/2008 0000

## 2012-04-16 NOTE — Assessment & Plan Note (Signed)
Doing well. Continue secondary preventative therapy. Followup with Dr. Jearld Pies in one year

## 2012-04-16 NOTE — Patient Instructions (Addendum)
Your physician recommends that you continue on your current medications as directed. Please refer to the Current Medication list given to you today.  Your physician wants you to follow-up in:   You will receive a reminder letter in the mail two months in advance. If you don't receive a letter, please call our office to schedule the follow-up appointment.

## 2012-04-16 NOTE — Assessment & Plan Note (Signed)
Status post aortic valve replacement. Doing well. Followup in one year.

## 2012-04-18 ENCOUNTER — Telehealth: Payer: Self-pay | Admitting: *Deleted

## 2012-04-18 MED ORDER — DOXEPIN HCL 50 MG PO CAPS: 50.0000 mg | ORAL_CAPSULE | Freq: Every day | ORAL | Status: DC

## 2012-04-18 NOTE — Telephone Encounter (Signed)
Noted incoming refill request for Doxepin 50mg  QD, noted recent change in chart, sent refill via escribe

## 2012-08-06 ENCOUNTER — Other Ambulatory Visit: Payer: Self-pay | Admitting: *Deleted

## 2012-08-06 DIAGNOSIS — I10 Essential (primary) hypertension: Secondary | ICD-10-CM

## 2012-08-06 DIAGNOSIS — I350 Nonrheumatic aortic (valve) stenosis: Secondary | ICD-10-CM

## 2012-08-06 MED ORDER — CARVEDILOL PHOSPHATE ER 80 MG PO CP24: 80.0000 mg | ORAL_CAPSULE | Freq: Every day | ORAL | Status: DC

## 2012-08-06 MED ORDER — ATORVASTATIN CALCIUM 40 MG PO TABS: 40.0000 mg | ORAL_TABLET | Freq: Every day | ORAL | Status: DC

## 2012-08-10 ENCOUNTER — Other Ambulatory Visit: Payer: Self-pay | Admitting: *Deleted

## 2012-08-10 DIAGNOSIS — I10 Essential (primary) hypertension: Secondary | ICD-10-CM

## 2012-08-10 DIAGNOSIS — I350 Nonrheumatic aortic (valve) stenosis: Secondary | ICD-10-CM

## 2012-08-10 MED ORDER — CARVEDILOL PHOSPHATE ER 80 MG PO CP24: 80.0000 mg | ORAL_CAPSULE | Freq: Every day | ORAL | Status: DC

## 2012-08-10 MED ORDER — ATORVASTATIN CALCIUM 40 MG PO TABS: 40.0000 mg | ORAL_TABLET | Freq: Every day | ORAL | Status: DC

## 2013-02-26 ENCOUNTER — Other Ambulatory Visit: Payer: Self-pay | Admitting: Cardiology

## 2013-04-15 ENCOUNTER — Other Ambulatory Visit: Payer: Self-pay | Admitting: Cardiology

## 2013-05-15 ENCOUNTER — Other Ambulatory Visit: Payer: Self-pay | Admitting: Cardiology

## 2013-05-28 ENCOUNTER — Ambulatory Visit (INDEPENDENT_AMBULATORY_CARE_PROVIDER_SITE_OTHER): Payer: 59 | Admitting: Cardiology

## 2013-05-28 ENCOUNTER — Encounter: Payer: Self-pay | Admitting: Cardiology

## 2013-05-28 VITALS — BP 148/80 | HR 72 | Ht 73.0 in | Wt 191.0 lb

## 2013-05-28 DIAGNOSIS — E785 Hyperlipidemia, unspecified: Secondary | ICD-10-CM

## 2013-05-28 DIAGNOSIS — I359 Nonrheumatic aortic valve disorder, unspecified: Secondary | ICD-10-CM

## 2013-05-28 DIAGNOSIS — I10 Essential (primary) hypertension: Secondary | ICD-10-CM

## 2013-05-28 DIAGNOSIS — R079 Chest pain, unspecified: Secondary | ICD-10-CM

## 2013-05-28 DIAGNOSIS — I2510 Atherosclerotic heart disease of native coronary artery without angina pectoris: Secondary | ICD-10-CM

## 2013-05-28 NOTE — Progress Notes (Signed)
Patient ID: Jonathan Jones, male   DOB: 12/08/1960, 53 y.o.   MRN: [id number] PCP: Dr. Earl Galasborne  53 yo with history of bicuspid aortic valve disorder s/p bioprosthetic AVR for AS and CABG x 3 presents for cardiology followup.  He has been seen by Dr. Daleen SquibbWall in the past and is seen by me for the first time today.  I reviewed all his old records.  Over the last couple of months, he has been under a lot of stress.  He has noted episodes of chest pain.  He will feel central chest tightness for a few seconds, then it will resolve.  The chest pain is not exertional.  It tends to be brought on by emotional stress.  He also develops dyspnea when he is under stress.  This can be at rest.  He can walk up to 3 miles without exertional dyspnea.  He is concerned about the dyspnea and chest pain.  He wants to start to exercise more but is concerned that he is developing a cardiac problem.    ECG: NSR, 1st degree AV block, anterolateral T wave inversions  PMH: 1. Bicuspid aortic valve disease: Severe AS.  Patient had bioprosthetic AVR in 1/09. Last echo in 8/10 showed EF 55-60%, bioprosthetic aortic valve with mild AI.   2. CAD: s/p CABG x 3 in 1/09 with AVR.  He had LIMA-LAD, SVG-D, and SVG-PDA.   3. HTN 4. H/o Fe deficiency anemia. 5. Hyperlipidemia 6. Migraines  SH: Married, nonsmoker, works as Product/process development scientistgeneral contractor and [place]   FH: Mother with MI in her 9050s  ROS: All systems reviewed and negative except as per HPI.   Current Outpatient Prescriptions  Medication Sig Dispense Refill  . aspirin EC 325 MG tablet Take 325 mg by mouth daily.        Marland Kitchen. atorvastatin (LIPITOR) 40 MG tablet TAKE 1 TABLET BY MOUTH EVERY DAY  30 tablet  0  . COREG CR 80 MG 24 hr capsule TAKE 1 CAPSULE BY MOUTH DAILY  30 capsule  0  . doxepin (SINEQUAN) 50 MG capsule Take 1 capsule (50 mg total) by mouth daily.  30 capsule  5   No current facility-administered medications for this visit.    BP 148/80   Pulse 72  Ht 6\' 1"  (1.854 m)  Wt 86.637 kg (191 lb)  BMI 25.20 kg/m2 General: NAD Neck: No JVD, no thyromegaly or thyroid nodule.  Lungs: Clear to auscultation bilaterally with normal respiratory effort. CV: Nondisplaced PMI.  Heart regular S1/S2, no S3/S4, 2/6 early SEM RUSB.  No peripheral edema.  No carotid bruit.  Normal pedal pulses.  Abdomen: Soft, nontender, no hepatosplenomegaly, no distention.  Skin: Intact without lesions or rashes.  Neurologic: Alert and oriented x 3.  Psych: Normal affect. Extremities: No clubbing or cyanosis.   Assessment/Plan: 1. Bicuspid aortic valve disorder: Patient developed severe AS and is now s/p bioprosthetic AVR.  Given his dyspnea symptoms, I think that it is time to repeat an echo to make sure that the valve is functioning properly and to assess LV/RV function.  He is at risk for thoracic aortic aneurysm with history of bicuspid aortic valve.  I will use the echo as a general screening tool and assess for any aortic root or ascending aorta dilation.  If this is noted, he may benefit from MRA chest.  2. CAD: Patient had CABG in 1/09 along with AVR.  He has atypical chest pain associated  with stress.  He is concerned about the symptoms and wants to start exercising more.  - I will arrange for ETT-Cardiolite to assess for ischemia.  - Continue ASA, statin, Coreg.  3. Hyperlipidemia: Check lipids today, goal LDL < 70.  4. HTN: BP mildly elevated, will continue to follow for now.   Laurey MoraleDalton S Danai Jones 05/28/2013

## 2013-05-28 NOTE — Patient Instructions (Signed)
Your physician has requested that you have an exercise stress myoview. For further information please visit [email or web address]. Please follow instruction sheet, as given.  Your physician has requested that you have an echocardiogram. Echocardiography is a painless test that uses sound waves to create images of your heart. It provides your doctor with information about the size and shape of your heart and how well your heart's chambers and valves are working. This procedure takes approximately one hour. There are no restrictions for this procedure.  Your physician recommends that you have lab work today: LIPID, LIVER and BMP  Your physician recommends that you continue on your current medications as directed. Please refer to the Current Medication list given to you today.  Your physician recommends that you schedule a follow-up appointment in: 3 WEEKS with Dr Shirlee LatchMcLean

## 2013-05-29 LAB — LIPID PANEL
CHOL/HDL RATIO: 3
Cholesterol: 139 mg/dL (ref 0–200)
HDL: 40.8 mg/dL (ref >39.00)
LDL CALC: 47 mg/dL (ref 0–99)
Triglycerides: 257 mg/dL — ABNORMAL HIGH (ref 0.0–149.0)
VLDL: 51.4 mg/dL — ABNORMAL HIGH (ref 0.0–40.0)

## 2013-05-29 LAB — BASIC METABOLIC PANEL
BUN: 18 mg/dL (ref 6–23)
CHLORIDE: 102 meq/L (ref 96–112)
CO2: 29 mEq/L (ref 19–32)
Calcium: 9.4 mg/dL (ref 8.4–10.5)
Creatinine, Ser: 0.9 mg/dL (ref 0.4–1.5)
GFR: 98.07 mL/min (ref >60.00)
Glucose, Bld: 91 mg/dL (ref 70–99)
Potassium: 4.3 mEq/L (ref 3.5–5.1)
Sodium: 138 mEq/L (ref 135–145)

## 2013-05-29 LAB — HEPATIC FUNCTION PANEL
ALBUMIN: 4.4 g/dL (ref 3.5–5.2)
ALT: 21 U/L (ref 0–53)
AST: 26 U/L (ref 0–37)
Alkaline Phosphatase: 71 U/L (ref 39–117)
Bilirubin, Direct: 0.1 mg/dL (ref 0.0–0.3)
Total Bilirubin: 1.3 mg/dL — ABNORMAL HIGH (ref 0.2–1.2)
Total Protein: 7.2 g/dL (ref 6.0–8.3)

## 2013-05-30 NOTE — Progress Notes (Signed)
Quick Note:  Preliminary report reviewed by triage nurse and sent to MD desk. ______ 

## 2013-06-17 ENCOUNTER — Ambulatory Visit (HOSPITAL_COMMUNITY): Payer: 59

## 2013-06-17 ENCOUNTER — Ambulatory Visit (INDEPENDENT_AMBULATORY_CARE_PROVIDER_SITE_OTHER): Payer: 59 | Admitting: Cardiology

## 2013-06-17 ENCOUNTER — Ambulatory Visit (HOSPITAL_COMMUNITY): Payer: 59 | Attending: Cardiology | Admitting: Radiology

## 2013-06-17 ENCOUNTER — Encounter: Payer: Self-pay | Admitting: Cardiology

## 2013-06-17 VITALS — BP 141/92 | HR 65 | Ht 73.0 in | Wt 189.0 lb

## 2013-06-17 DIAGNOSIS — I10 Essential (primary) hypertension: Secondary | ICD-10-CM | POA: Insufficient documentation

## 2013-06-17 DIAGNOSIS — R079 Chest pain, unspecified: Secondary | ICD-10-CM

## 2013-06-17 DIAGNOSIS — I2510 Atherosclerotic heart disease of native coronary artery without angina pectoris: Secondary | ICD-10-CM

## 2013-06-17 DIAGNOSIS — Z951 Presence of aortocoronary bypass graft: Secondary | ICD-10-CM | POA: Insufficient documentation

## 2013-06-17 DIAGNOSIS — R0602 Shortness of breath: Secondary | ICD-10-CM | POA: Insufficient documentation

## 2013-06-17 MED ORDER — TECHNETIUM TC 99M SESTAMIBI GENERIC - CARDIOLITE
33.0000 | Freq: Once | INTRAVENOUS | Status: AC | PRN
  Administered 2013-06-17: 33 via INTRAVENOUS

## 2013-06-17 MED ORDER — TECHNETIUM TC 99M SESTAMIBI GENERIC - CARDIOLITE
11.0000 | Freq: Once | INTRAVENOUS | Status: AC | PRN
  Administered 2013-06-17: 11 via INTRAVENOUS

## 2013-06-17 NOTE — Progress Notes (Addendum)
[facility] SITE 3 NUCLEAR MED [address] [place], [place] [address] [phone]    Cardiology Nuclear Med Study  Jonathan Jones is a 53 y.o. male     MRN : [id number]     DOB: 22-Jun-1960  Procedure Date: 06/17/2013  Nuclear Med Background Indication for Stress Test:  Evaluation for Ischemia, Graft Patency and Post Hospital:06/10/13 Hypertensive crisis and cardiac work up(-) History:  CAD, Cath, CABG AVR, Echo 2010 EF 55-60%, MPI 2010 (normal) EF 68% Cardiac Risk Factors: Carotid Disease, Family History - CAD, Hypertension and Lipids  Symptoms:  Chest Pain and SOB   Nuclear Pre-Procedure Caffeine/Decaff Intake:  None NPO After: 9:00pm   Lungs:  clear O2 Sat: 99% on room air. IV 0.9% NS with Angio Cath:  20g  IV Site: R Hand  IV Started by:  Cathlyn Parsons, RN  Chest Size (in):  42 Cup Size: n/a  Height: 6\' 1"  (1.854 m)  Weight:  189 lb (85.73 kg)  BMI:  Body mass index is 24.94 kg/(m^2). Tech Comments:  n/a    Nuclear Med Study 1 or 2 day study: 1 day  Stress Test Type:  Stress  Reading MD: N/A Order Authorizing Provider:  Marca Ancona, MD  Resting Radionuclide: Technetium 8m Sestamibi  Resting Radionuclide Dose: 11.0 mCi   Stress Radionuclide:  Technetium 55m Sestamibi  Stress Radionuclide Dose: 33.0 mCi           Stress Protocol Rest HR: 65 Stress HR: 150  Rest BP: 141/92 Stress BP: 171/82  Exercise Time (min): 12:00 METS: 13.7           Dose of Adenosine (mg):  n/a Dose of Lexiscan: n/a mg  Dose of Atropine (mg): n/a Dose of Dobutamine: n/a mcg/kg/min (at max HR)  Stress Test Technologist: Nelson Chimes, BS-ES  Nuclear Technologist:  Doyne Keel, CNMT     Rest Procedure:  Myocardial perfusion imaging was performed at rest 45 minutes following the intravenous administration of Technetium 58m Sestamibi. Rest ECG: NSR with non-specific ST-T wave changes  Stress Procedure:  The patient exercised on the treadmill utilizing the Bruce Protocol for  12:00 minutes. The patient stopped due to fatigue and denied any chest pain.  Technetium 85m Sestamibi was injected at peak exercise and myocardial perfusion imaging was performed after a brief delay. Stress ECG: No significant change from baseline ECG  QPS Raw Data Images:  Normal; no motion artifact; normal heart/lung ratio. Stress Images:  Normal homogeneous uptake in all areas of the myocardium. Rest Images:  Normal homogeneous uptake in all areas of the myocardium. Subtraction (SDS):  No evidence of ischemia. Transient Ischemic Dilatation (Normal <1.22):  0.88 Lung/Heart Ratio (Normal <0.45):  0.33  Quantitative Gated Spect Images QGS EDV:  108 ml QGS ESV:  45 ml  Impression Exercise Capacity:  Excellent exercise capacity. BP Response:  Normal blood pressure response. Clinical Symptoms:  No significant symptoms noted. ECG Impression:  No significant ST segment change suggestive of ischemia. Comparison with Prior Nuclear Study: No images to compare  Overall Impression:  Normal stress nuclear study.  LV Ejection Fraction: 58%.  LV Wall Motion:  NL LV Function; NL Wall Motion  Cassell Clement MD  Good exercise capacity.  Normal EF.  No evidence for ischemia or infarction.  Please report to patient.   Laurey Morale 06/18/2013 9:49 AM

## 2013-06-18 ENCOUNTER — Ambulatory Visit (HOSPITAL_COMMUNITY): Payer: 59 | Attending: Cardiology | Admitting: Cardiology

## 2013-06-18 ENCOUNTER — Other Ambulatory Visit: Payer: Self-pay | Admitting: Cardiology

## 2013-06-18 DIAGNOSIS — I10 Essential (primary) hypertension: Secondary | ICD-10-CM | POA: Insufficient documentation

## 2013-06-18 DIAGNOSIS — I2510 Atherosclerotic heart disease of native coronary artery without angina pectoris: Secondary | ICD-10-CM | POA: Insufficient documentation

## 2013-06-18 DIAGNOSIS — E785 Hyperlipidemia, unspecified: Secondary | ICD-10-CM | POA: Insufficient documentation

## 2013-06-18 DIAGNOSIS — Z954 Presence of other heart-valve replacement: Secondary | ICD-10-CM | POA: Insufficient documentation

## 2013-06-18 DIAGNOSIS — I359 Nonrheumatic aortic valve disorder, unspecified: Secondary | ICD-10-CM | POA: Insufficient documentation

## 2013-06-18 DIAGNOSIS — R079 Chest pain, unspecified: Secondary | ICD-10-CM | POA: Insufficient documentation

## 2013-06-18 NOTE — Progress Notes (Signed)
Echo performed. 

## 2013-06-19 ENCOUNTER — Telehealth: Payer: Self-pay | Admitting: *Deleted

## 2013-06-19 NOTE — Telephone Encounter (Signed)
Pt returning call to Nurse Dewayne Hatch about echo and stress test.  Per Dr Shirlee Latch these test were normal and no intervention needed. EF looks good.  Pt verbalized understanding and pleased with this news.

## 2013-06-19 NOTE — Progress Notes (Signed)
LMTCB

## 2013-06-26 NOTE — Progress Notes (Signed)
See phone note 06/19/13

## 2013-07-08 ENCOUNTER — Ambulatory Visit (INDEPENDENT_AMBULATORY_CARE_PROVIDER_SITE_OTHER): Payer: 59 | Admitting: Cardiology

## 2013-07-08 ENCOUNTER — Encounter: Payer: Self-pay | Admitting: Cardiology

## 2013-07-08 VITALS — BP 140/80 | HR 65 | Ht 73.0 in | Wt 193.0 lb

## 2013-07-08 DIAGNOSIS — I25119 Atherosclerotic heart disease of native coronary artery with unspecified angina pectoris: Secondary | ICD-10-CM

## 2013-07-08 DIAGNOSIS — I2510 Atherosclerotic heart disease of native coronary artery without angina pectoris: Secondary | ICD-10-CM

## 2013-07-08 DIAGNOSIS — I10 Essential (primary) hypertension: Secondary | ICD-10-CM

## 2013-07-08 DIAGNOSIS — I209 Angina pectoris, unspecified: Secondary | ICD-10-CM

## 2013-07-08 DIAGNOSIS — E785 Hyperlipidemia, unspecified: Secondary | ICD-10-CM

## 2013-07-08 DIAGNOSIS — I359 Nonrheumatic aortic valve disorder, unspecified: Secondary | ICD-10-CM

## 2013-07-08 NOTE — Patient Instructions (Signed)
Your physician wants you to follow-up in: 1 year with Dr McLean. (June 2016). You will receive a reminder letter in the mail two months in advance. If you don't receive a letter, please call our office to schedule the follow-up appointment.  

## 2013-07-08 NOTE — Progress Notes (Signed)
Patient ID: Jonathan Jones, male   DOB: 03-06-1960, 53 y.o.   MRN: [id number] PCP: Dr. Earl Jones  53 yo with history of bicuspid aortic valve disorder s/p bioprosthetic AVR for AS and CABG x 3 presents for cardiology followup. At last appointment, he reported episodes of atypical chest pain.  I had him do an ETT-Cardiolite in 6/15.  This showed excellent exercise tolerance with no ischemia or infarction.  Echo showed normal EF and normally-functioning bioprosthetic aortic valve.   He continues to have episodes of left-sided chest pain lasting seconds to 5 minutes at a time.  They are not exertional.  He continues to have good exercise tolerance with no exertional dyspnea or chest pain.     Labs (5/15): LDL 47, HDL 41, K 4.3, creatinine 0.9  PMH: 1. Bicuspid aortic valve disease: Severe AS.  Patient had bioprosthetic AVR in 1/09. Echo in 8/10 showed EF 55-60%, bioprosthetic aortic valve with mild AI.  Echo (6/15) with EF 55-60%, mild LVH, moderate diastolic dysfunction, bioprosthetic aortic valve with normal function, aortic root and visualized ascending aorta did not show aneurysmal dilation.  2. CAD: s/p CABG x 3 in 1/09 with AVR.  He had LIMA-LAD, SVG-D, and SVG-PDA.  ETT-Cardiolite (6/15) with 12' exercise, EF 58%, no ischemia or infarction.  3. HTN 4. H/o Fe deficiency anemia. 5. Hyperlipidemia 6. 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   FH: Mother with MI in her 5150s  ROS: All systems reviewed and negative except as per HPI.   Current Outpatient Prescriptions  Medication Sig Dispense Refill  . aspirin EC 325 MG tablet Take 325 mg by mouth daily.        Marland Kitchen. atorvastatin (LIPITOR) 40 MG tablet TAKE 1 TABLET BY MOUTH EVERY DAY  30 tablet  3  . COREG CR 80 MG 24 hr capsule TAKE 1 CAPSULE BY MOUTH DAILY  30 capsule  0  . doxepin (SINEQUAN) 50 MG capsule Take 1 capsule (50 mg total) by mouth daily.  30 capsule  5   No current  facility-administered medications for this visit.    BP 140/80  Pulse 65  Ht 6\' 1"  (1.854 m)  Wt 87.544 kg (193 lb)  BMI 25.47 kg/m2  SpO2 96% General: NAD Neck: No JVD, no thyromegaly or thyroid nodule.  Lungs: Clear to auscultation bilaterally with normal respiratory effort. CV: Nondisplaced PMI.  Heart regular S1/S2, no S3/S4, 2/6 early SEM RUSB.  No peripheral edema.  No carotid bruit.  Normal pedal pulses.  Abdomen: Soft, nontender, no hepatosplenomegaly, no distention.  Skin: Intact without lesions or rashes.  Neurologic: Alert and oriented x 3.  Psych: Normal affect. Extremities: No clubbing or cyanosis.   Assessment/Plan: 1. Bicuspid aortic valve disorder: Patient developed severe AS and is now s/p bioprosthetic AVR.  Echo in 6/15 showed a normal-appearing bioprosthetic aortic valve.  The aortic root and visualized ascending aorta were not dilated.  2. CAD: Patient had CABG in 1/09 along with AVR.  He has atypical chest pain.  ETT-Cardiolite in 6/15 showed excellent exercise tolerance with no ischemia or infarction on perfusion images. I suspect that his chest pain is noncardiac. - Continue ASA, statin, Coreg.  3. Hyperlipidemia: Good lipids in 5/15.  4. HTN: BP upper normal.  Would work on increasing exercise.  Jonathan Jones 07/08/2013

## 2013-08-12 ENCOUNTER — Other Ambulatory Visit: Payer: Self-pay | Admitting: Cardiology

## 2013-10-13 ENCOUNTER — Other Ambulatory Visit: Payer: Self-pay | Admitting: Cardiology

## 2014-03-11 ENCOUNTER — Other Ambulatory Visit: Payer: Self-pay | Admitting: *Deleted

## 2014-03-11 MED ORDER — CARVEDILOL PHOSPHATE ER 80 MG PO CP24: 80.0000 mg | ORAL_CAPSULE | Freq: Every day | ORAL | Status: DC

## 2014-03-18 ENCOUNTER — Telehealth: Payer: Self-pay | Admitting: *Deleted

## 2014-03-18 NOTE — Telephone Encounter (Signed)
That is not generic carvedilol.  That is Coreg CR.  He can switch to carvedilol 25 mg bid.

## 2014-03-18 NOTE — Telephone Encounter (Signed)
Patient called and stated that the carvedilol is a tier 4 on his insurance and costs almost $400/month. He would like to know if there is something more affordable that he can be switched to. Please advise. Thanks, MI

## 2014-03-18 NOTE — Telephone Encounter (Signed)
Generic carvedilol is very cheap.  No way it costs that much.  There must be a mistake here, someone needs to talk to him again.

## 2014-03-18 NOTE — Telephone Encounter (Signed)
Spoke to the pharmacy and was informed that the carvedilol 80mg  is not available in generic. The highest dose available in generic is 25mg . Please advise. Thanks, MI

## 2014-03-19 ENCOUNTER — Other Ambulatory Visit: Payer: Self-pay | Admitting: *Deleted

## 2014-03-19 MED ORDER — CARVEDILOL 25 MG PO TABS: 25.0000 mg | ORAL_TABLET | Freq: Two times a day (BID) | ORAL | Status: DC

## 2014-03-19 NOTE — Telephone Encounter (Signed)
An rx for Carvedilol 25mg  bid sent in. Patient aware.

## 2014-05-19 ENCOUNTER — Other Ambulatory Visit: Payer: Self-pay | Admitting: Cardiology

## 2014-06-17 ENCOUNTER — Ambulatory Visit (INDEPENDENT_AMBULATORY_CARE_PROVIDER_SITE_OTHER): Payer: BLUE CROSS/BLUE SHIELD | Admitting: Cardiology

## 2014-06-17 ENCOUNTER — Encounter: Payer: Self-pay | Admitting: Cardiology

## 2014-06-17 ENCOUNTER — Encounter: Payer: Self-pay | Admitting: *Deleted

## 2014-06-17 VITALS — BP 130/70 | HR 71 | Ht 73.0 in | Wt 191.0 lb

## 2014-06-17 DIAGNOSIS — I359 Nonrheumatic aortic valve disorder, unspecified: Secondary | ICD-10-CM

## 2014-06-17 DIAGNOSIS — I25810 Atherosclerosis of coronary artery bypass graft(s) without angina pectoris: Secondary | ICD-10-CM | POA: Diagnosis not present

## 2014-06-17 LAB — CBC WITH DIFFERENTIAL/PLATELET
BASOS ABS: 0 10*3/uL (ref 0.0–0.1)
Basophils Relative: 0.4 % (ref 0.0–3.0)
Eosinophils Absolute: 0.2 10*3/uL (ref 0.0–0.7)
Eosinophils Relative: 1.6 % (ref 0.0–5.0)
HEMATOCRIT: 44.5 % (ref 39.0–52.0)
Hemoglobin: 15.3 g/dL (ref 13.0–17.0)
LYMPHS ABS: 2.5 10*3/uL (ref 0.7–4.0)
Lymphocytes Relative: 20.5 % (ref 12.0–46.0)
MCHC: 34.5 g/dL (ref 30.0–36.0)
MCV: 88 fl (ref 78.0–100.0)
MONOS PCT: 6.9 % (ref 3.0–12.0)
Monocytes Absolute: 0.9 10*3/uL (ref 0.1–1.0)
NEUTROS PCT: 70.6 % (ref 43.0–77.0)
Neutro Abs: 8.7 10*3/uL — ABNORMAL HIGH (ref 1.4–7.7)
Platelets: 305 10*3/uL (ref 150.0–400.0)
RBC: 5.06 Mil/uL (ref 4.22–5.81)
RDW: 12.2 % (ref 11.5–15.5)
WBC: 12.3 10*3/uL — ABNORMAL HIGH (ref 4.0–10.5)

## 2014-06-17 LAB — BASIC METABOLIC PANEL
BUN: 20 mg/dL (ref 6–23)
CHLORIDE: 99 meq/L (ref 96–112)
CO2: 29 mEq/L (ref 19–32)
CREATININE: 0.99 mg/dL (ref 0.40–1.50)
Calcium: 9.1 mg/dL (ref 8.4–10.5)
GFR: 83.05 mL/min (ref >60.00)
Glucose, Bld: 124 mg/dL — ABNORMAL HIGH (ref 70–99)
Potassium: 3.6 mEq/L (ref 3.5–5.1)
SODIUM: 136 meq/L (ref 135–145)

## 2014-06-17 LAB — LDL CHOLESTEROL, DIRECT: Direct LDL: 52 mg/dL

## 2014-06-17 LAB — LIPID PANEL
Cholesterol: 147 mg/dL (ref 0–200)
HDL: 39.7 mg/dL (ref >39.00)
Total CHOL/HDL Ratio: 4
Triglycerides: 435 mg/dL — ABNORMAL HIGH (ref 0.0–149.0)

## 2014-06-17 NOTE — Patient Instructions (Signed)
Medication Instructions:  None today  Labwork: Lipid profile/BMET/CBCd today.  Testing/Procedures: Your physician has requested that you have an echocardiogram. Echocardiography is a painless test that uses sound waves to create images of your heart. It provides your doctor with information about the size and shape of your heart and how well your heart's chambers and valves are working. This procedure takes approximately one hour. There are no restrictions for this procedure. MAY 2017    Follow-Up: Your physician wants you to follow-up in: 1 year with Dr Shirlee LatchMcLean. (May 2017 you should have the echocardiogram before you see Dr Shirlee LatchMcLean) Bonita QuinYou will receive a reminder letter in the mail two months in advance. If you don't receive a letter, please call our office to schedule the follow-up appointment.

## 2014-06-18 DIAGNOSIS — I25810 Atherosclerosis of coronary artery bypass graft(s) without angina pectoris: Secondary | ICD-10-CM | POA: Insufficient documentation

## 2014-06-18 NOTE — Progress Notes (Signed)
Patient ID: Jonathan Jones, male   DOB: 1960-10-25, 54 y.o.   MRN: [id number] PCP: Dr. Bosie ClosSchooler  54 yo with history of bicuspid aortic valve disorder s/p bioprosthetic AVR for AS and CABG x 3 presents for cardiology followup. At last appointment, he reported episodes of atypical chest pain.  I had him do an ETT-Cardiolite in 6/15.  This showed excellent exercise tolerance with no ischemia or infarction.  Echo showed normal EF and normally-functioning bioprosthetic aortic valve.   He continues to have episodes of left-sided chest pain lasting seconds to 5 minutes at a time.  They are not exertional, no changes.  He continues to have good exercise tolerance with no exertional dyspnea or exertional chest pain.  He walks about 3 miles/day.  No palpitations.  Weight is down 2 lbs.   Labs (5/15): LDL 47, HDL 41, K 4.3, creatinine 0.9  ECG: NSR, inferolateral TWIs  PMH: 1. Bicuspid aortic valve disease: Severe AS.  Patient had bioprosthetic AVR in 1/09. Echo in 8/10 showed EF 55-60%, bioprosthetic aortic valve with mild AI.  Echo (6/15) with EF 55-60%, mild LVH, moderate diastolic dysfunction, bioprosthetic aortic valve with normal function, aortic root and visualized ascending aorta did not show aneurysmal dilation.  2. CAD: s/p CABG x 3 in 1/09 with AVR.  He had LIMA-LAD, SVG-D, and SVG-PDA.  ETT-Cardiolite (6/15) with 12' exercise, EF 58%, no ischemia or infarction.  3. HTN 4. H/o Fe deficiency anemia. 5. Hyperlipidemia 6. 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 3 children.   FH: Mother with MI in her 7250s  ROS: All systems reviewed and negative except as per HPI.   Current Outpatient Prescriptions  Medication Sig Dispense Refill  . aspirin EC 325 MG tablet Take 325 mg by mouth daily.      Marland Kitchen. atorvastatin (LIPITOR) 40 MG tablet TAKE 1 TABLET BY MOUTH EVERY DAY 30 tablet 1  . carvedilol (COREG) 25 MG tablet Take 1 tablet (25 mg  total) by mouth 2 (two) times daily. 60 tablet 3  . doxepin (SINEQUAN) 50 MG capsule Take 1 capsule (50 mg total) by mouth daily. 30 capsule 5   No current facility-administered medications for this visit.    BP 130/70 mmHg  Pulse 71  Ht 6\' 1"  (1.854 m)  Wt 191 lb (86.637 kg)  BMI 25.20 kg/m2 General: NAD Neck: No JVD, no thyromegaly or thyroid nodule.  Lungs: Clear to auscultation bilaterally with normal respiratory effort. CV: Nondisplaced PMI.  Heart regular S1/S2, no S3/S4, 2/6 early SEM RUSB.  No peripheral edema.  No carotid bruit.  Normal pedal pulses.  Abdomen: Soft, nontender, no hepatosplenomegaly, no distention.  Skin: Intact without lesions or rashes.  Neurologic: Alert and oriented x 3.  Psych: Normal affect. Extremities: No clubbing or cyanosis.   Assessment/Plan: 1. Bicuspid aortic valve disorder: Patient developed severe AS and is now s/p bioprosthetic AVR.  Echo in 6/15 showed a normal-appearing bioprosthetic aortic valve.  The aortic root and visualized ascending aorta were not dilated. I will arrange for repeat echo in 6/17 unless symptoms change.  2. CAD: Patient had CABG in 1/09 along with AVR.  He has chronic atypical chest pain.  ETT-Cardiolite in 6/15 showed excellent exercise tolerance with no ischemia or infarction on perfusion images. I suspect that his chest pain is noncardiac. - Continue ASA, statin, Coreg.  3. Hyperlipidemia: Check lipids today.  4. HTN: BP controlled.   Marca AnconaDalton Leni Pankonin 06/18/2014

## 2014-06-19 ENCOUNTER — Other Ambulatory Visit: Payer: Self-pay | Admitting: *Deleted

## 2014-06-19 DIAGNOSIS — E785 Hyperlipidemia, unspecified: Secondary | ICD-10-CM

## 2014-06-19 DIAGNOSIS — I359 Nonrheumatic aortic valve disorder, unspecified: Secondary | ICD-10-CM

## 2014-06-19 MED ORDER — FENOFIBRATE 48 MG PO TABS: 48.0000 mg | ORAL_TABLET | Freq: Every day | ORAL | Status: DC

## 2014-07-07 ENCOUNTER — Other Ambulatory Visit: Payer: Self-pay | Admitting: Cardiology

## 2014-07-09 ENCOUNTER — Ambulatory Visit: Payer: Self-pay | Admitting: Physician Assistant

## 2014-08-08 ENCOUNTER — Other Ambulatory Visit: Payer: Self-pay | Admitting: *Deleted

## 2014-08-08 MED ORDER — CARVEDILOL 25 MG PO TABS: 25.0000 mg | ORAL_TABLET | Freq: Two times a day (BID) | ORAL | Status: DC

## 2014-08-20 ENCOUNTER — Other Ambulatory Visit (INDEPENDENT_AMBULATORY_CARE_PROVIDER_SITE_OTHER): Payer: BLUE CROSS/BLUE SHIELD

## 2014-08-20 DIAGNOSIS — I359 Nonrheumatic aortic valve disorder, unspecified: Secondary | ICD-10-CM

## 2014-08-20 DIAGNOSIS — E785 Hyperlipidemia, unspecified: Secondary | ICD-10-CM

## 2014-08-20 LAB — LIPID PANEL
Cholesterol: 112 mg/dL (ref 0–200)
HDL: 44.4 mg/dL (ref >39.00)
LDL CALC: 49 mg/dL (ref 0–99)
NonHDL: 67.58
Total CHOL/HDL Ratio: 3
Triglycerides: 93 mg/dL (ref 0.0–149.0)
VLDL: 18.6 mg/dL (ref 0.0–40.0)

## 2014-10-15 ENCOUNTER — Other Ambulatory Visit: Payer: Self-pay | Admitting: Cardiology

## 2014-12-15 ENCOUNTER — Other Ambulatory Visit: Payer: Self-pay | Admitting: Cardiology

## 2015-01-05 ENCOUNTER — Telehealth: Payer: Self-pay | Admitting: Cardiology

## 2015-01-05 DIAGNOSIS — I25810 Atherosclerosis of coronary artery bypass graft(s) without angina pectoris: Secondary | ICD-10-CM

## 2015-01-05 DIAGNOSIS — R079 Chest pain, unspecified: Secondary | ICD-10-CM

## 2015-01-05 NOTE — Telephone Encounter (Signed)
New message  Pt c/o of Chest Pain: 1. Are you having CP right now? Yes But it is not servere  2. Are you experiencing any other symptoms (ex. SOB, nausea, vomiting, sweating)? Light headedness  3. How long have you been experiencing CP? For about 1 week. He first received the pain during golf.  4. Is your CP continuous or coming and going? Coming and going  5. Have you taken Nitroglycerin? No   Comments: No other comments. Pt states that he figures that it could be "nothing" but he wanted to make sure. Please call back to discuss

## 2015-01-05 NOTE — Telephone Encounter (Signed)
SPOKE  WITH PT RE MESSAGE  PT  DOES NOT THINK PAIN   IS ANY DIFFERENT  THAN WHAT  HE  HAS  DESCRIBED TO DR  MCLEAN IN PAST   HAD  EPISODE  ON GOLF COURSE  WITH SOME  SOB  AND  DIZZINESS    AND  AFTER  2-3  HOURS  FELT  FINE   DIS[facility]  PT  NEEDS   MYOVIEW  AND  F/U   IF  FEELS  PAIN IS  DIFFERENT  THAN NEEDS TO  TO ER  FOR EVAL   AND  TX PT AWARE  WILL FORWARD TO  SCHEDULERS  TO  SET  UP MYOVIEW  AND  F/U  APPT WITH  PA OR NP  .Jonathan Jones/CY

## 2015-01-08 NOTE — Telephone Encounter (Signed)
Order placed for myoview.  Pt to be called to be schedule for appt.

## 2015-01-20 NOTE — Telephone Encounter (Signed)
Does look like myoview has been scheduled, LMTCB for pt to follow up.

## 2015-01-23 NOTE — Telephone Encounter (Signed)
LMTCB for pt 

## 2015-01-23 NOTE — Telephone Encounter (Signed)
Pt states he does not recall getting a phone call from schedulers to schedule myoview but may have missed call during the holidays.  Pt states he has not had any symptoms similar to symptoms he had 01/05/15, he does continue to have sharp short lasting,?muscle spasm  pain he has talked with Dr Shirlee LatchMcLean about in the past.  Pt states he has been physically active over the holidays, playing golf, hunting. Pt states wants to get back in his regular exercise routine using his treadmill and states he wants to go ahead with myoview.   Pt states I will forward to [facility] to contact pt to schedule myoview.

## 2015-01-23 NOTE — Telephone Encounter (Signed)
Pt requesting call to his cell, [phone].

## 2015-02-02 ENCOUNTER — Telehealth (HOSPITAL_COMMUNITY): Payer: Self-pay | Admitting: *Deleted

## 2015-02-02 NOTE — Telephone Encounter (Signed)
Patient given detailed instructions per Myocardial Perfusion Study Information Sheet for the test on 02/04/15 at 0730. Patient notified to arrive 15 minutes early and that it is imperative to arrive on time for appointment to keep from having the test rescheduled.  If you need to cancel or reschedule your appointment, please call the office within 24 hours of your appointment. Failure to do so may result in a cancellation of your appointment, and a $50 no show fee. Patient verbalized understanding.Nachelle Negrette W    

## 2015-02-04 ENCOUNTER — Ambulatory Visit (HOSPITAL_COMMUNITY): Payer: BLUE CROSS/BLUE SHIELD | Attending: Cardiovascular Disease

## 2015-02-04 DIAGNOSIS — I25810 Atherosclerosis of coronary artery bypass graft(s) without angina pectoris: Secondary | ICD-10-CM | POA: Diagnosis not present

## 2015-02-04 DIAGNOSIS — R079 Chest pain, unspecified: Secondary | ICD-10-CM | POA: Diagnosis not present

## 2015-02-04 DIAGNOSIS — R0602 Shortness of breath: Secondary | ICD-10-CM | POA: Diagnosis not present

## 2015-02-04 DIAGNOSIS — R9439 Abnormal result of other cardiovascular function study: Secondary | ICD-10-CM | POA: Insufficient documentation

## 2015-02-04 DIAGNOSIS — I10 Essential (primary) hypertension: Secondary | ICD-10-CM | POA: Insufficient documentation

## 2015-02-04 LAB — MYOCARDIAL PERFUSION IMAGING
CHL CUP NUCLEAR SDS: 1
CHL CUP NUCLEAR SRS: 2
CHL RATE OF PERCEIVED EXERTION: 18
CSEPEW: 13.4 METS
CSEPPHR: 144 {beats}/min
Exercise duration (min): 12 min
Exercise duration (sec): 0 s
LHR: 0.35
LV dias vol: 111 mL
LV sys vol: 43 mL
MPHR: 164 {beats}/min
Percent HR: 87 %
Rest HR: 54 {beats}/min
SSS: 3
TID: 0.94

## 2015-02-04 MED ORDER — TECHNETIUM TC 99M SESTAMIBI GENERIC - CARDIOLITE
32.8000 | Freq: Once | INTRAVENOUS | Status: AC | PRN
  Administered 2015-02-04: 32.8 via INTRAVENOUS

## 2015-02-04 MED ORDER — TECHNETIUM TC 99M SESTAMIBI GENERIC - CARDIOLITE
10.6000 | Freq: Once | INTRAVENOUS | Status: AC | PRN
  Administered 2015-02-04: 11 via INTRAVENOUS

## 2015-05-01 DIAGNOSIS — Z23 Encounter for immunization: Secondary | ICD-10-CM | POA: Diagnosis not present

## 2015-05-25 ENCOUNTER — Other Ambulatory Visit: Payer: Self-pay | Admitting: Cardiology

## 2015-06-03 DIAGNOSIS — R339 Retention of urine, unspecified: Secondary | ICD-10-CM | POA: Diagnosis not present

## 2015-06-03 DIAGNOSIS — Z1389 Encounter for screening for other disorder: Secondary | ICD-10-CM | POA: Diagnosis not present

## 2015-06-03 DIAGNOSIS — E784 Other hyperlipidemia: Secondary | ICD-10-CM | POA: Diagnosis not present

## 2015-06-10 ENCOUNTER — Other Ambulatory Visit: Payer: Self-pay

## 2015-06-10 ENCOUNTER — Ambulatory Visit (HOSPITAL_COMMUNITY): Payer: BLUE CROSS/BLUE SHIELD | Attending: Cardiovascular Disease

## 2015-06-10 DIAGNOSIS — I359 Nonrheumatic aortic valve disorder, unspecified: Secondary | ICD-10-CM | POA: Diagnosis not present

## 2015-06-10 DIAGNOSIS — I350 Nonrheumatic aortic (valve) stenosis: Secondary | ICD-10-CM | POA: Insufficient documentation

## 2015-06-10 DIAGNOSIS — Z953 Presence of xenogenic heart valve: Secondary | ICD-10-CM | POA: Insufficient documentation

## 2015-06-10 DIAGNOSIS — I119 Hypertensive heart disease without heart failure: Secondary | ICD-10-CM | POA: Insufficient documentation

## 2015-06-10 DIAGNOSIS — E785 Hyperlipidemia, unspecified: Secondary | ICD-10-CM | POA: Insufficient documentation

## 2015-06-10 DIAGNOSIS — Z951 Presence of aortocoronary bypass graft: Secondary | ICD-10-CM | POA: Diagnosis not present

## 2015-06-10 DIAGNOSIS — I25810 Atherosclerosis of coronary artery bypass graft(s) without angina pectoris: Secondary | ICD-10-CM

## 2015-06-10 DIAGNOSIS — I071 Rheumatic tricuspid insufficiency: Secondary | ICD-10-CM | POA: Diagnosis not present

## 2015-06-17 ENCOUNTER — Telehealth: Payer: Self-pay | Admitting: *Deleted

## 2015-06-17 DIAGNOSIS — Q231 Congenital insufficiency of aortic valve: Secondary | ICD-10-CM

## 2015-06-17 DIAGNOSIS — I77810 Thoracic aortic ectasia: Secondary | ICD-10-CM

## 2015-06-17 NOTE — Telephone Encounter (Signed)
Notes Recorded by Laurey Moralealton S McLean, MD on 06/12/2015 at 12:00 PM Ascending aorta dilated to 4.6 cm. It was not seen as well on the prior echo so unsure if this is new or old. He is at risk for aortic dilation with history of bicuspid aortic valve. I would like him to have a cardiac MRI to reassess his bioprosthetic aortic valve as well as an MR angiogram of the chest to fully visualize his thoracic aorta.

## 2015-06-18 ENCOUNTER — Telehealth: Payer: Self-pay | Admitting: Cardiology

## 2015-06-18 ENCOUNTER — Encounter: Payer: Self-pay | Admitting: Cardiology

## 2015-06-18 NOTE — Telephone Encounter (Signed)
Called the patients home, work and cell number leaving messages at each one regarding his cardiac MRI on 07-07-15 at 11 a.m.  Letter mailed to the patient.

## 2015-06-22 ENCOUNTER — Other Ambulatory Visit: Payer: Self-pay | Admitting: Cardiology

## 2015-07-03 ENCOUNTER — Telehealth: Payer: Self-pay | Admitting: Nurse Practitioner

## 2015-07-03 DIAGNOSIS — Q231 Congenital insufficiency of aortic valve: Secondary | ICD-10-CM

## 2015-07-03 DIAGNOSIS — I77810 Thoracic aortic ectasia: Secondary | ICD-10-CM

## 2015-07-03 NOTE — Telephone Encounter (Signed)
Received message from insurance pre-certification team that MRI has been approved and CT angio is not needed.  Order and appointment cancelled.

## 2015-07-03 NOTE — Addendum Note (Signed)
Addended by: Levi AlandSWINYER, MICHELLE M on: 07/03/2015 08:48 AM   Modules accepted: Orders

## 2015-07-03 NOTE — Telephone Encounter (Signed)
Order placed for CT Angio of chest/aorta per staff message from Dr. Shirlee LatchMcLean:  He can get a CT angiogram of the chest to assess the thoracic aortic aneurysm if they will approve that. I suspect this may be their preferred test so let's try that first.  Thanks.   This message is in response to a message from our pre-cert department regarding difficulty getting approval for MRI/MRA

## 2015-07-07 ENCOUNTER — Ambulatory Visit (HOSPITAL_COMMUNITY): Payer: BLUE CROSS/BLUE SHIELD

## 2015-07-07 ENCOUNTER — Ambulatory Visit (HOSPITAL_COMMUNITY)
Admission: RE | Admit: 2015-07-07 | Discharge: 2015-07-07 | Disposition: A | Discharge status: Home / Self Care | Payer: BLUE CROSS/BLUE SHIELD | Source: Ambulatory Visit | Attending: Cardiology | Admitting: Cardiology

## 2015-07-07 DIAGNOSIS — Q2381 Bicuspid aortic valve: Secondary | ICD-10-CM

## 2015-07-07 DIAGNOSIS — Q231 Congenital insufficiency of aortic valve: Secondary | ICD-10-CM | POA: Diagnosis not present

## 2015-07-07 DIAGNOSIS — I77810 Thoracic aortic ectasia: Secondary | ICD-10-CM

## 2015-07-07 DIAGNOSIS — Z953 Presence of xenogenic heart valve: Secondary | ICD-10-CM | POA: Diagnosis not present

## 2015-07-07 DIAGNOSIS — Z951 Presence of aortocoronary bypass graft: Secondary | ICD-10-CM | POA: Diagnosis not present

## 2015-07-07 LAB — CREATININE, SERUM
Creatinine, Ser: 0.99 mg/dL (ref 0.61–1.24)
GFR calc Af Amer: >60 mL/min (ref >60)
GFR calc non Af Amer: >60 mL/min (ref >60)

## 2015-07-07 MED ORDER — GADOBENATE DIMEGLUMINE 529 MG/ML IV SOLN
30.0000 mL | Freq: Once | INTRAVENOUS | Status: AC | PRN
  Administered 2015-07-07: 29 mL via INTRAVENOUS

## 2015-07-08 ENCOUNTER — Telehealth: Payer: Self-pay | Admitting: Cardiology

## 2015-07-16 ENCOUNTER — Other Ambulatory Visit: Payer: Self-pay | Admitting: Cardiology

## 2015-07-24 DIAGNOSIS — Z7982 Long term (current) use of aspirin: Secondary | ICD-10-CM | POA: Diagnosis not present

## 2015-07-24 DIAGNOSIS — M67931 Unspecified disorder of synovium and tendon, right forearm: Secondary | ICD-10-CM | POA: Diagnosis not present

## 2015-07-24 DIAGNOSIS — I10 Essential (primary) hypertension: Secondary | ICD-10-CM | POA: Diagnosis not present

## 2015-07-24 DIAGNOSIS — M25421 Effusion, right elbow: Secondary | ICD-10-CM | POA: Diagnosis not present

## 2015-07-24 DIAGNOSIS — M7021 Olecranon bursitis, right elbow: Secondary | ICD-10-CM | POA: Diagnosis not present

## 2015-07-29 DIAGNOSIS — M7021 Olecranon bursitis, right elbow: Secondary | ICD-10-CM | POA: Diagnosis not present

## 2015-08-10 DIAGNOSIS — M7021 Olecranon bursitis, right elbow: Secondary | ICD-10-CM | POA: Diagnosis not present

## 2015-08-20 ENCOUNTER — Other Ambulatory Visit: Payer: Self-pay | Admitting: Cardiology

## 2015-08-20 NOTE — Telephone Encounter (Signed)
Pt calling requesting refill on his heart medications. Pt has not been seen since 05/2014. Pt stated that he had an ECHO done recently and that Dr. Shirlee Latch stated that he did not need to see him for a year. Please advise

## 2015-08-21 NOTE — Telephone Encounter (Signed)
Yes, I would agree with that.

## 2015-08-21 NOTE — Telephone Encounter (Signed)
He is requesting refills of atorvastatin and carvedilol. He understood he did not need to see you until May 2018.  I was going to say okay to refill but he should schedule an appointment for follow up here in the few months. Do you agree?

## 2015-08-21 NOTE — Telephone Encounter (Signed)
He had echo May 2017 and Cardiac MRI June 2017, notes indicate repeat echo in 1 year, May 2018.

## 2015-09-04 ENCOUNTER — Other Ambulatory Visit: Payer: Self-pay | Admitting: *Deleted

## 2015-09-04 ENCOUNTER — Telehealth: Payer: Self-pay | Admitting: Cardiology

## 2015-09-04 MED ORDER — FENOFIBRATE 48 MG PO TABS: 48.0000 mg | ORAL_TABLET | Freq: Every day | ORAL | 0 refills | Status: DC

## 2015-09-04 MED ORDER — CARVEDILOL 25 MG PO TABS: 25.0000 mg | ORAL_TABLET | Freq: Two times a day (BID) | ORAL | 0 refills | Status: DC

## 2015-09-04 MED ORDER — ATORVASTATIN CALCIUM 40 MG PO TABS: ORAL_TABLET | ORAL | 0 refills | Status: DC

## 2015-09-04 NOTE — Telephone Encounter (Signed)
New message       *STAT* If patient is at the pharmacy, call can be transferred to refill team.   1. Which medications need to be refilled? (please list name of each medication and dose if known) atorvastatin 40mg , coreg 25mg , fenofibrate   2. Which pharmacy/location (including street and city if local pharmacy) is medication to be sent to?[facility] [address]  3. Do they need a 30 day or 90 day supply? Enough to last until 09-22-15 appt

## 2015-09-04 NOTE — Telephone Encounter (Signed)
Pt Rx was sent to pt's pharmacy as requested by Terin, CMA. Confirmation received.

## 2015-09-15 DIAGNOSIS — F4323 Adjustment disorder with mixed anxiety and depressed mood: Secondary | ICD-10-CM | POA: Diagnosis not present

## 2015-09-22 ENCOUNTER — Encounter (INDEPENDENT_AMBULATORY_CARE_PROVIDER_SITE_OTHER): Payer: Self-pay

## 2015-09-22 ENCOUNTER — Encounter: Payer: Self-pay | Admitting: Physician Assistant

## 2015-09-22 ENCOUNTER — Ambulatory Visit (INDEPENDENT_AMBULATORY_CARE_PROVIDER_SITE_OTHER): Payer: BLUE CROSS/BLUE SHIELD | Admitting: Physician Assistant

## 2015-09-22 VITALS — BP 154/98 | HR 63 | Ht 72.0 in | Wt 197.8 lb

## 2015-09-22 DIAGNOSIS — E782 Mixed hyperlipidemia: Secondary | ICD-10-CM | POA: Diagnosis not present

## 2015-09-22 DIAGNOSIS — I77810 Thoracic aortic ectasia: Secondary | ICD-10-CM

## 2015-09-22 DIAGNOSIS — I25810 Atherosclerosis of coronary artery bypass graft(s) without angina pectoris: Secondary | ICD-10-CM

## 2015-09-22 DIAGNOSIS — I359 Nonrheumatic aortic valve disorder, unspecified: Secondary | ICD-10-CM | POA: Diagnosis not present

## 2015-09-22 DIAGNOSIS — I10 Essential (primary) hypertension: Secondary | ICD-10-CM

## 2015-09-22 NOTE — Progress Notes (Signed)
He can see Dr. Okey DupreEnd in followup.

## 2015-09-22 NOTE — Progress Notes (Signed)
Cardiology Office Note    Date:  09/22/2015   ID:  Jonathan Jones, DOB 1960-05-14, MRN [id number]  PCP:  Alysia Penna, MD  Cardiologist: Dr. Shirlee Latch  Chief Complaint  Patient presents with  . Follow-up    History of Present Illness:  Jonathan Jones is a 55 y.o. male  with history of bicuspid aortic valve disorder status post bioprosthetic aVR and CABG in 01/2007. He has had chronic atypical chest pain. He had a low risk nuclear stress tests 01/2015 EF 61% Rule out small area of ischemia however he has excellent exercise tolerance was not having exertional symptoms so Dr. Isidoro Donning recommended medical therapy.  2-D echo 05/2015 showed gradients across the bioprosthetic valve are increased in the leaflet adjacent to the RVOT appear restricted. The ascending aortic aneurysm was not visualized on prior study therefore Dr. Shirlee Latch ordered an MRA which showed a dilated ascending aorta was 4.4 cm. See below for details.  Patient comes in today for yearly follow-up and medication refills. He has a severe migraine today. His blood pressure is high because of this. Usually has blood pressure is well-controlled. He has not been exercising like he used to. He knows he has to get back to it. He's gained 10 pounds in the past year. He denies chest pain, palpitations, dyspnea, dyspnea on exertion, dizziness or presyncope.    Past Medical History:  Diagnosis Date  . Aortic stenosis   . Heart murmur   . Hypertension    Unspecified  . Iron deficiency anemia secondary to blood loss (chronic)   . Migraine headache   . Mixed hyperlipidemia     Past Surgical History:  Procedure Laterality Date  . AORTIC VALVE REPLACEMENT  1/09  . CORONARY ARTERY BYPASS GRAFT  1/09   x3    Current Medications: Outpatient Medications Prior to Visit  Medication Sig Dispense Refill  . aspirin EC 325 MG tablet Take 325 mg by mouth daily.      Marland Kitchen atorvastatin (LIPITOR) 40 MG tablet TAKE 1 TABLET(40 MG) BY MOUTH DAILY.  30 tablet 0  . carvedilol (COREG) 25 MG tablet Take 1 tablet (25 mg total) by mouth 2 (two) times daily with a meal. 60 tablet 0  . doxepin (SINEQUAN) 50 MG capsule Take 1 capsule (50 mg total) by mouth daily. 30 capsule 5  . fenofibrate (TRICOR) 48 MG tablet Take 1 tablet (48 mg total) by mouth daily. 30 tablet 0   No facility-administered medications prior to visit.      Allergies:   Review of patient's allergies indicates no known allergies.   Social History   Social History  . Marital status: Married    Spouse name: N/A  . Number of children: N/A  . Years of education: N/A   Occupational History  . Full time    Social History Main Topics  . Smoking status: Never Smoker  . Smokeless tobacco: None     Comment: Tobacco use-no  . Alcohol use Yes     Comment: Occasionally  . Drug use: No  . Sexual activity: Not Asked   Other Topics Concern  . None   Social History Narrative  . None     Family History:  The patient's   family history includes Alzheimer's disease in his father; Cancer (age of onset: 72) in his other; Coronary artery disease in his other; Other (age of onset: 46) in his sister.   ROS:   Please see the history of present illness.  Review of Systems  Constitution: Negative.  HENT: Positive for headaches.        Migraines  Cardiovascular: Negative.   Respiratory: Negative.   Endocrine: Negative.   Hematologic/Lymphatic: Negative.   Musculoskeletal: Negative.   Gastrointestinal: Negative.   Genitourinary: Negative.    All other systems reviewed and are negative.   PHYSICAL EXAM:   VS:  BP (!) 154/98   Pulse 63   Ht 6' (1.829 m)   Wt 197 lb 12.8 oz (89.7 kg)   SpO2 97%   BMI 26.83 kg/m   Physical Exam  GEN: Well nourished, well developed, in no acute distress  Neck: no JVD, carotid bruits, or masses Cardiac:RRR; no murmurs, rubs, or gallops  Respiratory:  clear to auscultation bilaterally, normal work of breathing GI: soft, nontender,  nondistended, + BS Ext: without cyanosis, clubbing, or edema, Good distal pulses bilaterally MS: no deformity or atrophy  Skin: warm and dry, no rash Psych: euthymic mood, full affect  Wt Readings from Last 3 Encounters:  09/22/15 197 lb 12.8 oz (89.7 kg)  02/04/15 191 lb (86.6 kg)  06/17/14 191 lb (86.6 kg)      Studies/Labs Reviewed:   EKG:  EKG is  ordered today.    Recent Labs: 07/07/2015: Creatinine, Ser 0.99   Lipid Panel    Component Value Date/Time   CHOL 112 08/20/2014 0805   TRIG 93.0 08/20/2014 0805   HDL 44.40 08/20/2014 0805   CHOLHDL 3 08/20/2014 0805   VLDL 18.6 08/20/2014 0805   LDLCALC 49 08/20/2014 0805   LDLDIRECT 52.0 06/17/2014 1505    Additional studies/ records that were reviewed today include:   2-D echo 05/2015 Study Conclusions   - Left ventricle: The cavity size was normal. There was mild   concentric hypertrophy. Systolic function was vigorous. The   estimated ejection fraction was in the range of 65% to 70%. Wall   motion was normal; there were no regional wall motion   abnormalities. Features are consistent with a pseudonormal left   ventricular filling pattern, with concomitant abnormal relaxation   and increased filling pressure (grade 2 diastolic dysfunction). - Aortic valve: A bioprosthesis was present and well-seated.   Transvalvular velocity was increased. There was mild stenosis.   Peak velocity (S): 312.1 cm/s. Acceleration time: 113 ms. Mean   gradient (S): 24 mm Hg. Valve area (VTI): 1.41 cm^2. Valve area   (Vmax): 1.35 cm^2. Valve area (Vmean): 1.42 cm^2. - Mitral valve: Transvalvular velocity was within the normal range.   There was no evidence for stenosis. There was no regurgitation. - Right ventricle: The cavity size was normal. Wall thickness was   normal. Systolic function was normal. - Tricuspid valve: There was trivial regurgitation.   Impressions:   - Compared with echo 06/21/13, gradients across the bioprosthetic  are   increased and the leaflet adjacent to the RVOT appears   restricted. The ascending aortic aneurysm was not visualized on   the prior study, though no equivalent view was obained for direct   comparison.  Nuclear stress test 01/2015 Study Highlights   Nuclear stress EF: 61%.  There was no ST segment deviation noted during stress.  This is a low risk study.  The left ventricular ejection fraction is normal (55-65%).   Low risk stress nuclear study with small, mild, reversible inferoseptal and apical defects; findings suggest mild ischemia in the inferior septal wall; prominent apical thinning; cannot R/O very mild apical ischemia; EF 61 with normal wall motion.  MRA 06/2015 IMPRESSION: 1. Normal LV size and systolic function, EF 55%. Normal RV size and systolic function.   2. Small area of subendocardial < 25% LGE in the mid anterior wall suggests small area of prior infarction (patient has history of CABG).   3. Bioprosthetic aortic valve noted. All three valve leaflets appeared to open though valve not seen particularly well due to artifact from struts. No aortic insufficiency noted.   4.  Dilated ascending aorta to 4.4 cm.   Dalton Mclean     ASSESSMENT:    1. Aortic valve disorder   2. Ascending aorta dilatation (HCC)   3. Coronary artery disease involving coronary bypass graft of native heart without angina pectoris   4. Hyperlipidemia, mixed   5. Essential hypertension      PLAN:  In order of problems listed above:  Aortic valve disorder status post bioprosthetic aVR in 2009 with CABG. 2-D echo 05/2015 showed gradients across by prosthetic valve are increased in the leaflets adjacent to the RVOT appear restricted. Continue to monitor closely. Patient is asymptomatic.  Ascending aorta dilated on 2-D echo MRA shows it is 4.4 cm. To be followed by Dr. Shirlee LatchMcLean yearly  CAD status post CABG in 2009 without angina. Low risk nuclear stress test  01/2015  Hyperlipidemia we'll check fasting lipid panel and maintenance labs  Essential hypertension blood pressure up today but patient has a migraine and blood pressure is usually well controlled. Patient will check his blood pressure at home and call us if they remain elevated.    Medication Adjustments/Labs and Tests Ordered: Current medicines are reviewed at length with the patient today.  Concerns regarding medicines are outlined above.  Medication changes, Labs and Tests ordered today are listed in the Patient Instructions below. Patient Instructions  Fasting Lab ( cmet,lipid panel,cbc ) Thursday 09/24/15 [facility] 7:30 am to 12:00 noon   Your physician wants you to follow-up in: 1 year with a cardiologist. Bonita QuinYou will receive a reminder letter in the mail two months in advance. If you don't receive a letter, please call our office to schedule the follow-up appointment.     Signed, Jacolyn ReedyMichele Lenze, PA-C  09/22/2015 1:44 PM    [facility] [address], [place], [place]  [address] Phone: [phone]; Fax: [phone]

## 2015-09-22 NOTE — Patient Instructions (Signed)
Fasting Lab ( cmet,lipid panel,cbc ) Thursday 09/24/15 [facility] 7:30 am to 12:00 noon   Your physician wants you to follow-up in: 1 year with a cardiologist. Jonathan QuinYou will receive a reminder letter in the mail two months in advance. If you don't receive a letter, please call our office to schedule the follow-up appointment.

## 2015-09-24 ENCOUNTER — Telehealth: Payer: Self-pay

## 2015-09-24 ENCOUNTER — Other Ambulatory Visit: Payer: BLUE CROSS/BLUE SHIELD | Admitting: *Deleted

## 2015-09-24 DIAGNOSIS — I359 Nonrheumatic aortic valve disorder, unspecified: Secondary | ICD-10-CM | POA: Diagnosis not present

## 2015-09-24 DIAGNOSIS — E782 Mixed hyperlipidemia: Secondary | ICD-10-CM

## 2015-09-24 DIAGNOSIS — I25810 Atherosclerosis of coronary artery bypass graft(s) without angina pectoris: Secondary | ICD-10-CM

## 2015-09-24 LAB — CBC WITH DIFFERENTIAL/PLATELET
BASOS ABS: 0 {cells}/uL (ref 0–200)
Basophils Relative: 0 %
EOS PCT: 4 %
Eosinophils Absolute: 312 cells/uL (ref 15–500)
HCT: 43.4 % (ref 38.5–50.0)
Hemoglobin: 14.6 g/dL (ref 13.2–17.1)
LYMPHS PCT: 23 %
Lymphs Abs: 1794 cells/uL (ref 850–3900)
MCH: 29.8 pg (ref 27.0–33.0)
MCHC: 33.6 g/dL (ref 32.0–36.0)
MCV: 88.6 fL (ref 80.0–100.0)
MONOS PCT: 9 %
MPV: 10.2 fL (ref 7.5–12.5)
Monocytes Absolute: 702 cells/uL (ref 200–950)
NEUTROS ABS: 4992 {cells}/uL (ref 1500–7800)
NEUTROS PCT: 64 %
PLATELETS: 236 10*3/uL (ref 140–400)
RBC: 4.9 MIL/uL (ref 4.20–5.80)
RDW: 13.3 % (ref 11.0–15.0)
WBC: 7.8 10*3/uL (ref 3.8–10.8)

## 2015-09-24 LAB — COMPREHENSIVE METABOLIC PANEL
ALT: 20 U/L (ref 9–46)
AST: 21 U/L (ref 10–35)
Albumin: 4.3 g/dL (ref 3.6–5.1)
Alkaline Phosphatase: 87 U/L (ref 40–115)
BUN: 20 mg/dL (ref 7–25)
CHLORIDE: 103 mmol/L (ref 98–110)
CO2: 26 mmol/L (ref 20–31)
Calcium: 9.1 mg/dL (ref 8.6–10.3)
Creat: 1 mg/dL (ref 0.70–1.33)
GLUCOSE: 97 mg/dL (ref 65–99)
POTASSIUM: 3.9 mmol/L (ref 3.5–5.3)
Sodium: 138 mmol/L (ref 135–146)
Total Bilirubin: 0.4 mg/dL (ref 0.2–1.2)
Total Protein: 6.8 g/dL (ref 6.1–8.1)

## 2015-09-24 LAB — LIPID PANEL
Cholesterol: 147 mg/dL (ref 125–200)
HDL: 31 mg/dL — AB (ref >=40)
LDL CALC: 45 mg/dL (ref <130)
TRIGLYCERIDES: 355 mg/dL — AB (ref <150)
Total CHOL/HDL Ratio: 4.7 Ratio (ref <=5.0)
VLDL: 71 mg/dL — AB (ref <30)

## 2015-09-24 NOTE — Telephone Encounter (Signed)
-----   Message from Dyann KiefMichele M Lenze, [place] sent at 09/23/2015  8:03 AM EDT ----- Elnita Maxwellheryl, Dr. Shirlee LatchMcLean says to have this patient f/u with Dr. Okey DupreEnd.Can you arrange? Thanks, Elon JesterMichele ----- Message ----- From: Laurey Moralealton S McLean, MD Sent: 09/22/2015  10:04 PM To: Dyann KiefMichele M Lenze, PA-C    ----- Message ----- From: Dyann KiefMichele M Lenze, PA-C Sent: 09/22/2015   1:47 PM To: Laurey Moralealton S McLean, MD  Who do you want this patient to f/u with?

## 2015-09-24 NOTE — Telephone Encounter (Signed)
Spoke to patient.Dr.McLean advised to follow up with Dr.End in 1 year.

## 2015-09-29 DIAGNOSIS — F4323 Adjustment disorder with mixed anxiety and depressed mood: Secondary | ICD-10-CM | POA: Diagnosis not present

## 2015-10-06 DIAGNOSIS — F4323 Adjustment disorder with mixed anxiety and depressed mood: Secondary | ICD-10-CM | POA: Diagnosis not present

## 2015-10-08 ENCOUNTER — Telehealth: Payer: Self-pay | Admitting: Physician Assistant

## 2015-10-08 ENCOUNTER — Other Ambulatory Visit: Payer: Self-pay | Admitting: Cardiology

## 2015-10-08 NOTE — Telephone Encounter (Signed)
Spoke to pt re: lab results. He clarifies that he is taking both atorvastatin and tricor. Pt has been advised that he will be referred to the [facility] and he will be waiting on a call to get it scheduled. The best contact # is [phone]  Pt agreeable with this plan.  Fwd phone note to schedulers to call and get pt an appt.

## 2015-10-08 NOTE — Telephone Encounter (Signed)
F/u Message  Pt call requesting to speak with RN about lab results. Please call back to discuss

## 2015-10-09 NOTE — Telephone Encounter (Signed)
New Message   *STAT* If patient is at the pharmacy, call can be transferred to refill team.   1. Which medications need to be refilled? (please list name of each medication and dose if known)  Atorvastatin 40 mg tablet once daily carvedilol 25 mg tablet twice daily fenofibrate 48 mg tablet once daily  2. Which pharmacy/location (including street and city if local pharmacy) is medication to be sent to? [facility], [address], [place]  3. Do they need a 30 day or 90 day supply?  30 Day  Pt voiced to call once prescription has been submitted so he'll know to go to pharmacy to pick up prescription.  Please f/u with pt

## 2015-10-12 DIAGNOSIS — M2141 Flat foot [pes planus] (acquired), right foot: Secondary | ICD-10-CM | POA: Diagnosis not present

## 2015-10-12 DIAGNOSIS — M7521 Bicipital tendinitis, right shoulder: Secondary | ICD-10-CM | POA: Diagnosis not present

## 2015-10-12 DIAGNOSIS — M2142 Flat foot [pes planus] (acquired), left foot: Secondary | ICD-10-CM | POA: Diagnosis not present

## 2015-10-13 ENCOUNTER — Ambulatory Visit: Payer: BLUE CROSS/BLUE SHIELD | Admitting: Pharmacist

## 2015-10-13 NOTE — Progress Notes (Deleted)
Patient ID: Jonathan Jones                 DOB: 05-03-1960                    MRN: [id number]     HPI: Jonathan Jones is a 55 y.o. male patient of Dr. Shirlee LatchMcLean referred to lipid clinic by Jacolyn ReedyMichele Lenze, PA. PMH is significant for bicuspid AVR s/p bioprosthetic AVR and CABG x3 in 2009 to LIMA-LAD, SVG-D, and SVG-PDA, CAD, HTN, HLD, chronic atypical chest pain, and migraines.  Current Medications: Atorvastatin 40mg  daily, fenofibrate 48mg  daily Intolerances: none Risk Factors: CAD s/p CABG LDL goal: 70mg /dL, TG 150mg /dL  Diet:   Exercise:   Family History:   Social History: Patient denies tobacco use, occasional alcohol use, and denies illicit drug use.  Labs: 09/24/2015: TC 147, TG 355, HDL 31, LDL 45 (atorvastatin 40mg  daily and fenofibrate 48mg  daily)  Past Medical History:  Diagnosis Date  . Aortic stenosis   . Heart murmur   . Hypertension    Unspecified  . Iron deficiency anemia secondary to blood loss (chronic)   . Migraine headache   . Mixed hyperlipidemia     Current Outpatient Prescriptions on File Prior to Visit  Medication Sig Dispense Refill  . aspirin EC 325 MG tablet Take 325 mg by mouth daily.      Marland Kitchen. atorvastatin (LIPITOR) 40 MG tablet TAKE 1 TABLET(40 MG) BY MOUTH DAILY 90 tablet 3  . carvedilol (COREG) 25 MG tablet TAKE 1 TABLET(25 MG) BY MOUTH TWICE DAILY WITH A MEAL. 180 tablet 3  . doxepin (SINEQUAN) 50 MG capsule Take 1 capsule (50 mg total) by mouth daily. 30 capsule 5  . fenofibrate (TRICOR) 48 MG tablet TAKE 1 TABLET(48 MG) BY MOUTH DAILY 90 tablet 3   No current facility-administered medications on file prior to visit.     No Known Allergies  Assessment/Plan:  1. Hyperlipidemia - LDL falsely low d/t elevated TG. Will increase fenofibrate to 145mg  daily and also increase atorvastatin to 80mg  daily.   Megan E. Supple, PharmD, CPP [facility] [address], [place], [place] [address] Phone: [phone]; Fax: [phone]  [phone] 10/13/2015 7:37 AM

## 2015-10-14 ENCOUNTER — Ambulatory Visit (INDEPENDENT_AMBULATORY_CARE_PROVIDER_SITE_OTHER): Payer: BLUE CROSS/BLUE SHIELD | Admitting: Pharmacist

## 2015-10-14 VITALS — BP 146/90 | HR 68 | Wt 194.8 lb

## 2015-10-14 DIAGNOSIS — E785 Hyperlipidemia, unspecified: Secondary | ICD-10-CM

## 2015-10-14 MED ORDER — FENOFIBRATE 145 MG PO TABS: 145.0000 mg | ORAL_TABLET | Freq: Every day | ORAL | 3 refills | Status: DC

## 2015-10-14 NOTE — Patient Instructions (Addendum)
Double up on your fenofibrate. When you run out of your current supply in a month and a half, pick up a new prescription for the higher dose of fenofibrate 145mg  to start taking once a day.  Continue taking your Lipitor.  Increase walking back to 3 miles most days a week.  Recheck cholesterol in 6 months on Tuesday, March 27th. Come in any time after 7:30am for fasting lab work.

## 2015-10-14 NOTE — Progress Notes (Signed)
Patient ID: Jonathan Jones                 DOB: 1960/06/09                    MRN: [id number]     HPI: Jonathan Jones is a 55 y.o. male patient of Dr. Shirlee LatchMcLean referred to lipid clinic by Jacolyn ReedyMichele Lenze, PA. PMH is significant for bioprosthetic AVR and CABG x3 vessels in 2009 to the LIMA-LAD, SVG-D, and SVG-PDA, chronic atypical chest pain, HTN, HLD, and migraines.  Most recent lipid panel showed LDL at goal but TG elevated to 355. Patient reports he had been on vacation for a few weeks prior to labwork being drawn. He drank more alcohol than usual on vacation and also did not eat as healthy as he usually does. He is currently tolerating and adherent to his Lipitor and fenofibrate.  Current Medications: Lipitor 40mg  daily, fenofibrate 48mg  daily Risk Factors: CABG x3, family history of early ASCVD (mother) LDL goal: 70mg /dL, TG goal 150mg /dL  Diet: Kashi cereal with 2% milk for breakfast. Lunch: sandwich, apple, or smoothie. Dinner: pork loin and roasted veggies. Drinks water. Avoids fried foods. Does like cookies but is trying to cut back. Eats more red meat during hunting season.  Exercise: Used to work out at [employer] and walk 3 miles daily. Would like to get back into walking more.  Family History: Mother had a heart attack at 657. Alzheimer's disease in his father; Cancer (age of onset: 3691) in his other; Coronary artery disease in his other; Other (age of onset: 2550) in his sister.   Social History: Patient denies tobacco use and illicit drug use. Occasional alcohol.  Labs: 09/24/2015: TC 147, TG 355, HDL 31, LDL 45 (on Lipitor 40mg  daily and fenofibrate 48mg  daily)  Past Medical History:  Diagnosis Date  . Aortic stenosis   . Heart murmur   . Hypertension    Unspecified  . Iron deficiency anemia secondary to blood loss (chronic)   . Migraine headache   . Mixed hyperlipidemia     Current Outpatient Prescriptions on File Prior to Visit  Medication Sig Dispense Refill  . aspirin EC  325 MG tablet Take 325 mg by mouth daily.      Marland Kitchen. atorvastatin (LIPITOR) 40 MG tablet TAKE 1 TABLET(40 MG) BY MOUTH DAILY 90 tablet 3  . carvedilol (COREG) 25 MG tablet TAKE 1 TABLET(25 MG) BY MOUTH TWICE DAILY WITH A MEAL. 180 tablet 3  . doxepin (SINEQUAN) 50 MG capsule Take 1 capsule (50 mg total) by mouth daily. 30 capsule 5  . fenofibrate (TRICOR) 48 MG tablet TAKE 1 TABLET(48 MG) BY MOUTH DAILY 90 tablet 3   No current facility-administered medications on file prior to visit.     No Known Allergies  Assessment/Plan:  1. Hyperlipidemia - LDL at goal 70mg /dL given history of CABG. Triglycerides elevated to 355mg /dL above goal 150mg /dL secondary to dietary indiscretion and increased alcohol intake while on vacation. Pt will work to improve diet and also walk more frequently. Will increase fenofibrate to 145mg  daily and continue Lipitor 40mg  daily. Follow up lipid panel in 6 months.   Megan E. Supple, PharmD, CPP [facility] [address], [place], [place] [address] Phone: [phone]; Fax: [phone] 10/14/2015 10:09 AM

## 2015-10-19 ENCOUNTER — Encounter: Payer: BLUE CROSS/BLUE SHIELD | Admitting: Sports Medicine

## 2015-10-20 ENCOUNTER — Other Ambulatory Visit: Payer: Self-pay | Admitting: *Deleted

## 2015-10-20 ENCOUNTER — Encounter: Payer: Self-pay | Admitting: Sports Medicine

## 2015-10-20 ENCOUNTER — Ambulatory Visit (INDEPENDENT_AMBULATORY_CARE_PROVIDER_SITE_OTHER): Payer: BLUE CROSS/BLUE SHIELD | Admitting: Sports Medicine

## 2015-10-20 VITALS — BP 143/76 | HR 63 | Ht 72.0 in | Wt 190.0 lb

## 2015-10-20 DIAGNOSIS — M2141 Flat foot [pes planus] (acquired), right foot: Secondary | ICD-10-CM | POA: Diagnosis not present

## 2015-10-20 DIAGNOSIS — F4323 Adjustment disorder with mixed anxiety and depressed mood: Secondary | ICD-10-CM | POA: Diagnosis not present

## 2015-10-20 DIAGNOSIS — M2142 Flat foot [pes planus] (acquired), left foot: Secondary | ICD-10-CM

## 2015-10-20 NOTE — Progress Notes (Signed)
   Subjective:    Patient ID: Jonathan Jones, male    DOB: [birth year], 55 y.o.   MRN: [id number]  HPI chief complaint: Left foot pain  Very pleasant 55 year old male comes in today complaining of chronic left mid foot pain. He saw Dr. Thurston HoleWainer on September 25. X-rays of his left foot and left ankle done in Dr. Sherene SiresWainer's office were fairly unremarkable other than some pes planus which was noted on his foot film. Dr. Thurston HoleWainer referred him to our office for custom orthotics. He has not had orthotics in the past. He denies any numbness or tingling. Pain is worse with standing for long periods of time. He takes anti-inflammatories with some symptom relief.  Past medical history reviewed Medications reviewed Allergies reviewed    Review of Systems    as above Objective:   Physical Exam  Well-developed, well-nourished. No acute distress. Awake alert and oriented 3. Vital signs reviewed  Examination of his feet in the standing position shows pes planus bilaterally. Full ankle and subtalar range of motion bilaterally. No significant tenderness to palpation. No soft tissue swelling. He has a narrow foot. Pronation with walking. No limp. Neurovascularly intact distally.  X-rays of his left foot and ankle done in Dr. Sherene SiresWainer's office are as above.      Assessment & Plan:   Pes planus  Custom orthotics were constructed for the patient today. He found these to be comfortable prior to leaving the office. Total of 30 minutes was spent with the patient with greater than 50% of the time spent in face-to-face consultation discussing orthotic construction, instruction, and fitting. He will try these orthotics in all of his shoes. I would be happy to fit his golf shoes with a separate pair of orthotics if needed. No restrictions on activity. Follow-up with me as needed.

## 2015-11-06 DIAGNOSIS — F4323 Adjustment disorder with mixed anxiety and depressed mood: Secondary | ICD-10-CM | POA: Diagnosis not present

## 2015-11-11 DIAGNOSIS — F4323 Adjustment disorder with mixed anxiety and depressed mood: Secondary | ICD-10-CM | POA: Diagnosis not present

## 2015-12-30 DIAGNOSIS — F4323 Adjustment disorder with mixed anxiety and depressed mood: Secondary | ICD-10-CM | POA: Diagnosis not present

## 2016-01-23 DIAGNOSIS — J029 Acute pharyngitis, unspecified: Secondary | ICD-10-CM | POA: Diagnosis not present

## 2016-01-23 DIAGNOSIS — I10 Essential (primary) hypertension: Secondary | ICD-10-CM | POA: Diagnosis not present

## 2016-01-26 DIAGNOSIS — F4323 Adjustment disorder with mixed anxiety and depressed mood: Secondary | ICD-10-CM | POA: Diagnosis not present

## 2016-01-27 DIAGNOSIS — R05 Cough: Secondary | ICD-10-CM | POA: Diagnosis not present

## 2016-01-27 DIAGNOSIS — J019 Acute sinusitis, unspecified: Secondary | ICD-10-CM | POA: Diagnosis not present

## 2016-01-27 DIAGNOSIS — R51 Headache: Secondary | ICD-10-CM | POA: Diagnosis not present

## 2016-01-27 DIAGNOSIS — J029 Acute pharyngitis, unspecified: Secondary | ICD-10-CM | POA: Diagnosis not present

## 2016-01-27 DIAGNOSIS — Z6827 Body mass index (BMI) 27.0-27.9, adult: Secondary | ICD-10-CM | POA: Diagnosis not present

## 2016-01-27 DIAGNOSIS — G43111 Migraine with aura, intractable, with status migrainosus: Secondary | ICD-10-CM | POA: Diagnosis not present

## 2016-02-03 DIAGNOSIS — Z125 Encounter for screening for malignant neoplasm of prostate: Secondary | ICD-10-CM | POA: Diagnosis not present

## 2016-02-03 DIAGNOSIS — I10 Essential (primary) hypertension: Secondary | ICD-10-CM | POA: Diagnosis not present

## 2016-02-03 DIAGNOSIS — Z0000 Encounter for general adult medical examination without abnormal findings: Secondary | ICD-10-CM | POA: Diagnosis not present

## 2016-02-10 DIAGNOSIS — R339 Retention of urine, unspecified: Secondary | ICD-10-CM | POA: Diagnosis not present

## 2016-02-10 DIAGNOSIS — Z0000 Encounter for general adult medical examination without abnormal findings: Secondary | ICD-10-CM | POA: Diagnosis not present

## 2016-02-10 DIAGNOSIS — I712 Thoracic aortic aneurysm, without rupture: Secondary | ICD-10-CM | POA: Diagnosis not present

## 2016-02-10 DIAGNOSIS — I10 Essential (primary) hypertension: Secondary | ICD-10-CM | POA: Diagnosis not present

## 2016-02-10 DIAGNOSIS — Z1389 Encounter for screening for other disorder: Secondary | ICD-10-CM | POA: Diagnosis not present

## 2016-02-10 DIAGNOSIS — I25728 Atherosclerosis of autologous artery coronary artery bypass graft(s) with other forms of angina pectoris: Secondary | ICD-10-CM | POA: Diagnosis not present

## 2016-02-22 DIAGNOSIS — Z1212 Encounter for screening for malignant neoplasm of rectum: Secondary | ICD-10-CM | POA: Diagnosis not present

## 2016-04-08 ENCOUNTER — Other Ambulatory Visit: Payer: Self-pay | Admitting: Gastroenterology

## 2016-04-12 ENCOUNTER — Other Ambulatory Visit: Payer: BLUE CROSS/BLUE SHIELD

## 2016-04-14 ENCOUNTER — Encounter (HOSPITAL_COMMUNITY): Payer: Self-pay | Admitting: *Deleted

## 2016-04-19 ENCOUNTER — Other Ambulatory Visit: Payer: Self-pay | Admitting: Gastroenterology

## 2016-04-19 ENCOUNTER — Ambulatory Visit (HOSPITAL_COMMUNITY)
Admission: RE | Admit: 2016-04-19 | Payer: BLUE CROSS/BLUE SHIELD | Source: Ambulatory Visit | Admitting: Gastroenterology

## 2016-04-19 HISTORY — DX: Unspecified osteoarthritis, unspecified site: M19.90

## 2016-04-19 HISTORY — DX: Gastro-esophageal reflux disease without esophagitis: K21.9

## 2016-04-19 SURGERY — COLONOSCOPY WITH PROPOFOL
Anesthesia: Monitor Anesthesia Care

## 2016-05-09 ENCOUNTER — Encounter (HOSPITAL_COMMUNITY): Payer: Self-pay | Admitting: *Deleted

## 2016-05-10 ENCOUNTER — Encounter (HOSPITAL_COMMUNITY): Payer: Self-pay

## 2016-05-10 ENCOUNTER — Encounter (HOSPITAL_COMMUNITY)
Admission: RE | Disposition: A | Discharge status: Home / Self Care | Payer: Self-pay | Source: Ambulatory Visit | Attending: Gastroenterology

## 2016-05-10 ENCOUNTER — Ambulatory Visit (HOSPITAL_COMMUNITY): Payer: BLUE CROSS/BLUE SHIELD | Admitting: Anesthesiology

## 2016-05-10 ENCOUNTER — Ambulatory Visit (HOSPITAL_COMMUNITY)
Admission: RE | Admit: 2016-05-10 | Discharge: 2016-05-10 | Disposition: A | Discharge status: Home / Self Care | Payer: BLUE CROSS/BLUE SHIELD | Source: Ambulatory Visit | Attending: Gastroenterology | Admitting: Gastroenterology

## 2016-05-10 DIAGNOSIS — Z951 Presence of aortocoronary bypass graft: Secondary | ICD-10-CM | POA: Insufficient documentation

## 2016-05-10 DIAGNOSIS — Z7982 Long term (current) use of aspirin: Secondary | ICD-10-CM | POA: Insufficient documentation

## 2016-05-10 DIAGNOSIS — I10 Essential (primary) hypertension: Secondary | ICD-10-CM | POA: Diagnosis not present

## 2016-05-10 DIAGNOSIS — Z1211 Encounter for screening for malignant neoplasm of colon: Secondary | ICD-10-CM | POA: Diagnosis not present

## 2016-05-10 DIAGNOSIS — I739 Peripheral vascular disease, unspecified: Secondary | ICD-10-CM | POA: Insufficient documentation

## 2016-05-10 DIAGNOSIS — Z953 Presence of xenogenic heart valve: Secondary | ICD-10-CM | POA: Insufficient documentation

## 2016-05-10 DIAGNOSIS — E782 Mixed hyperlipidemia: Secondary | ICD-10-CM | POA: Diagnosis not present

## 2016-05-10 DIAGNOSIS — E7800 Pure hypercholesterolemia, unspecified: Secondary | ICD-10-CM | POA: Diagnosis not present

## 2016-05-10 DIAGNOSIS — I2510 Atherosclerotic heart disease of native coronary artery without angina pectoris: Secondary | ICD-10-CM | POA: Diagnosis not present

## 2016-05-10 DIAGNOSIS — Q231 Congenital insufficiency of aortic valve: Secondary | ICD-10-CM | POA: Insufficient documentation

## 2016-05-10 HISTORY — PX: COLONOSCOPY WITH PROPOFOL: SHX5780

## 2016-05-10 SURGERY — COLONOSCOPY WITH PROPOFOL
Anesthesia: Monitor Anesthesia Care

## 2016-05-10 MED ORDER — LIDOCAINE 2% (20 MG/ML) 5 ML SYRINGE
INTRAMUSCULAR | Status: DC | PRN
  Administered 2016-05-10: 60 mg via INTRAVENOUS

## 2016-05-10 MED ORDER — PROPOFOL 500 MG/50ML IV EMUL
INTRAVENOUS | Status: DC | PRN
  Administered 2016-05-10: 150 ug/kg/min via INTRAVENOUS

## 2016-05-10 MED ORDER — LACTATED RINGERS IV SOLN
INTRAVENOUS | Status: DC
  Administered 2016-05-10: 1000 mL via INTRAVENOUS

## 2016-05-10 MED ORDER — SODIUM CHLORIDE 0.9 % IV SOLN: INTRAVENOUS | Status: DC

## 2016-05-10 MED ORDER — PROPOFOL 10 MG/ML IV BOLUS
INTRAVENOUS | Status: DC | PRN
  Administered 2016-05-10 (×3): 30 mg via INTRAVENOUS

## 2016-05-10 MED ORDER — LIDOCAINE 2% (20 MG/ML) 5 ML SYRINGE
INTRAMUSCULAR | Status: AC
  Filled 2016-05-10: qty 5

## 2016-05-10 MED ORDER — PROPOFOL 10 MG/ML IV BOLUS
INTRAVENOUS | Status: AC
  Filled 2016-05-10: qty 60

## 2016-05-10 SURGICAL SUPPLY — 22 items

## 2016-05-10 NOTE — Anesthesia Procedure Notes (Signed)
Procedure Name: MAC Date/Time: 05/10/2016 1:21 PM Performed by: Dione Booze Pre-anesthesia Checklist: Patient identified, Emergency Drugs available, Suction available and Patient being monitored Patient Re-evaluated:Patient Re-evaluated prior to inductionOxygen Delivery Method: Simple face mask Placement Confirmation: positive ETCO2

## 2016-05-10 NOTE — Anesthesia Preprocedure Evaluation (Signed)
Anesthesia Evaluation  Patient identified by MRN, date of birth, ID band Patient awake    Reviewed: Allergy & Precautions, NPO status , Patient's Chart, lab work & pertinent test results  Airway Mallampati: II  TM Distance: >3 FB Neck ROM: Full    Dental no notable dental hx.    Pulmonary neg pulmonary ROS,    Pulmonary exam normal breath sounds clear to auscultation       Cardiovascular hypertension, + CABG and + Peripheral Vascular Disease  + Valvular Problems/Murmurs AS  Rhythm:Regular Rate:Normal + Systolic murmurs    Neuro/Psych negative neurological ROS  negative psych ROS   GI/Hepatic negative GI ROS, Neg liver ROS,   Endo/Other  negative endocrine ROS  Renal/GU negative Renal ROS  negative genitourinary   Musculoskeletal negative musculoskeletal ROS (+)   Abdominal   Peds negative pediatric ROS (+)  Hematology negative hematology ROS (+)   Anesthesia Other Findings   Reproductive/Obstetrics negative OB ROS                             Anesthesia Physical Anesthesia Plan  ASA: III  Anesthesia Plan: MAC   Post-op Pain Management:    Induction: Intravenous  Airway Management Planned: Nasal Cannula  Additional Equipment:   Intra-op Plan:   Post-operative Plan:   Informed Consent: I have reviewed the patients History and Physical, chart, labs and discussed the procedure including the risks, benefits and alternatives for the proposed anesthesia with the patient or authorized representative who has indicated his/her understanding and acceptance.   Dental advisory given  Plan Discussed with: CRNA and Surgeon  Anesthesia Plan Comments:         Anesthesia Quick Evaluation

## 2016-05-10 NOTE — Anesthesia Postprocedure Evaluation (Signed)
Anesthesia Post Note  Patient: Jonathan Jones  Procedure(s) Performed: Procedure(s) (LRB): COLONOSCOPY WITH PROPOFOL (N/A)  Patient location during evaluation: PACU Anesthesia Type: MAC Level of consciousness: awake and alert Pain management: pain level controlled Vital Signs Assessment: post-procedure vital signs reviewed and stable Respiratory status: spontaneous breathing, nonlabored ventilation, respiratory function stable and patient connected to nasal cannula oxygen Cardiovascular status: stable and blood pressure returned to baseline Anesthetic complications: no       Last Vitals:  Vitals:   05/10/16 1400 05/10/16 1410  BP: 124/70 126/84  Pulse: 71 76  Resp: 15 18  Temp:      Last Pain:  Vitals:   05/10/16 1357  TempSrc: Oral                 Mahrukh Seguin S

## 2016-05-10 NOTE — Transfer of Care (Signed)
Immediate Anesthesia Transfer of Care Note  Patient: Jonathan Jones  Procedure(s) Performed: Procedure(s): COLONOSCOPY WITH PROPOFOL (N/A)  Patient Location: PACU and Endoscopy Unit  Anesthesia Type:MAC  Level of Consciousness: sedated and patient cooperative  Airway & Oxygen Therapy: Patient Spontanous Breathing and Patient connected to face mask oxygen  Post-op Assessment: Report given to RN and Post -op Vital signs reviewed and stable  Post vital signs: Reviewed and stable  Last Vitals:  Vitals:   05/10/16 1259  BP: (!) 173/93  Pulse: 83  Resp: 15  Temp: 36.9 C    Last Pain:  Vitals:   05/10/16 1259  TempSrc: Oral         Complications: No apparent anesthesia complications

## 2016-05-10 NOTE — H&P (Signed)
Procedure: Screening colonoscopy. 12/26/2005 normal screening colonoscopy was performed. Remote aortic valve replacement surgery and coronary artery bypass grafting surgery. Chronic aspirin daily.  History: The patient is a 56 year old male born [birth year]. He takes aspirin 325 mg daily post aortic valve replacement surgery and coronary artery bypass grafting surgery. As part of his routine physical exam, the patient submitted stool for Hemoccult testing and his stool was heme positive. His hemoglobin was 15.1 grams. He underwent a normal screening colonoscopy in December 2007.  He is scheduled to undergo a repeat screening colonoscopy today.  Past medical history: Bovine aortic valve replacement surgery performed in 2010. Coronary artery bypass grafting surgery was performed in 2010. Bicuspid aortic valve. Migraine headache syndrome. Hypercholesterolemia. Hypertension. Allergic rhinitis. Psoriasis.  Exam: The patient is alert and lying comfortably on the endoscopy stretcher. Abdomen is soft and nontender to palpation. Lungs are clear to auscultation. Cardiac exam reveals a regular rhythm.  Plan: Proceed with screening colonoscopy

## 2016-05-10 NOTE — Op Note (Signed)
[facility] Patient Name: Jonathan Jones Procedure Date: 05/10/2016 MRN: [id number] Attending MD: Charolett Bumpers , MD Date of Birth: 01-May-1960 CSN: [id number] Age: 56 Admit Type: Outpatient Procedure:                Colonoscopy Indications:              Screening for colorectal malignant neoplasm Providers:                Charolett Bumpers, MD, Omelia Blackwater RN, RN,                            Beryle Beams, Technician, Delphia Grates, CRNA Referring MD:              Medicines:                Propofol per Anesthesia Complications:            No immediate complications. Estimated Blood Loss:     Estimated blood loss: none. Procedure:                Pre-Anesthesia Assessment:                           - Prior to the procedure, a History and Physical                            was performed, and patient medications and                            allergies were reviewed. The patient's tolerance of                            previous anesthesia was also reviewed. The risks                            and benefits of the procedure and the sedation                            options and risks were discussed with the patient.                            All questions were answered, and informed consent                            was obtained. Prior Anticoagulants: The patient has                            taken aspirin, last dose was day of procedure. ASA                            Grade Assessment: II - A patient with mild systemic                            disease. After reviewing the risks and benefits,  the patient was deemed in satisfactory condition to                            undergo the procedure.                           After obtaining informed consent, the colonoscope                            was passed under direct vision. Throughout the                            procedure, the patient's blood pressure, pulse, and                oxygen saturations were monitored continuously. The                            was introduced through the anus and advanced to the                            the cecum, identified by appendiceal orifice and                            ileocecal valve. The colonoscopy was performed                            without difficulty. The patient tolerated the                            procedure well. The quality of the bowel                            preparation was good. The appendiceal orifice and                            the rectum were photographed. Scope In: 1:32:34 PM Scope Out: 1:47:31 PM Scope Withdrawal Time: 0 hours 9 minutes 43 seconds  Total Procedure Duration: 0 hours 14 minutes 57 seconds  Findings:      The perianal and digital rectal examinations were normal.      The entire examined colon appeared normal. Impression:               - The entire examined colon is normal.                           - No specimens collected. Moderate Sedation:      N/A- Per Anesthesia Care Recommendation:           - Patient has a contact number available for                            emergencies. The signs and symptoms of potential                            delayed complications were discussed with the  patient. Return to normal activities tomorrow.                            Written discharge instructions were provided to the                            patient.                           - Repeat colonoscopy in 10 years for screening                            purposes.                           - Resume previous diet.                           - Continue present medications. Procedure Code(s):        --- Professional ---                           Z6109, Colorectal cancer screening; colonoscopy on                            individual not meeting criteria for high risk Diagnosis Code(s):        --- Professional ---                           Z12.11,  Encounter for screening for malignant                            neoplasm of colon CPT copyright 2016 [employer] Medical Association. All rights reserved. The codes documented in this report are preliminary and upon coder review may  be revised to meet current compliance requirements. Danise Edge, MD Charolett Bumpers, MD 05/10/2016 1:56:00 PM This report has been signed electronically. Number of Addenda: 0

## 2016-05-10 NOTE — Discharge Instructions (Signed)

## 2016-05-12 ENCOUNTER — Encounter (HOSPITAL_COMMUNITY): Payer: Self-pay | Admitting: Gastroenterology

## 2016-06-14 DIAGNOSIS — H2513 Age-related nuclear cataract, bilateral: Secondary | ICD-10-CM | POA: Diagnosis not present

## 2016-06-20 NOTE — Anesthesia Postprocedure Evaluation (Signed)
Anesthesia Post Note  Patient: Jonathan Jones  Procedure(s) Performed: Procedure(s) (LRB): COLONOSCOPY WITH PROPOFOL (N/A)     Anesthesia Post Evaluation  Last Vitals:  Vitals:   05/10/16 1400 05/10/16 1410  BP: 124/70 126/84  Pulse: 71 76  Resp: 15 18  Temp:      Last Pain:  Vitals:   05/10/16 1357  TempSrc: Oral                 Rosamaria Donn S

## 2016-06-20 NOTE — Addendum Note (Signed)
Addendum  created 06/20/16 1341 by Priyana Mccarey, MD   Sign clinical note    

## 2016-09-23 ENCOUNTER — Encounter: Payer: Self-pay | Admitting: Internal Medicine

## 2016-09-23 ENCOUNTER — Encounter (INDEPENDENT_AMBULATORY_CARE_PROVIDER_SITE_OTHER): Payer: Self-pay

## 2016-09-23 ENCOUNTER — Ambulatory Visit (INDEPENDENT_AMBULATORY_CARE_PROVIDER_SITE_OTHER): Payer: BLUE CROSS/BLUE SHIELD | Admitting: Internal Medicine

## 2016-09-23 VITALS — BP 124/74 | HR 72 | Ht 72.0 in | Wt 186.0 lb

## 2016-09-23 DIAGNOSIS — E785 Hyperlipidemia, unspecified: Secondary | ICD-10-CM | POA: Diagnosis not present

## 2016-09-23 DIAGNOSIS — I2510 Atherosclerotic heart disease of native coronary artery without angina pectoris: Secondary | ICD-10-CM

## 2016-09-23 DIAGNOSIS — Z952 Presence of prosthetic heart valve: Secondary | ICD-10-CM

## 2016-09-23 DIAGNOSIS — I712 Thoracic aortic aneurysm, without rupture, unspecified: Secondary | ICD-10-CM

## 2016-09-23 DIAGNOSIS — Q231 Congenital insufficiency of aortic valve: Secondary | ICD-10-CM | POA: Diagnosis not present

## 2016-09-23 DIAGNOSIS — I10 Essential (primary) hypertension: Secondary | ICD-10-CM

## 2016-09-23 LAB — BASIC METABOLIC PANEL
BUN / CREAT RATIO: 21 — AB (ref 9–20)
BUN: 26 mg/dL — AB (ref 6–24)
CALCIUM: 9.8 mg/dL (ref 8.7–10.2)
CO2: 24 mmol/L (ref 20–29)
CREATININE: 1.26 mg/dL (ref 0.76–1.27)
Chloride: 100 mmol/L (ref 96–106)
GFR calc non Af Amer: 62 mL/min/{1.73_m2} (ref >59)
GFR, EST AFRICAN AMERICAN: 72 mL/min/{1.73_m2} (ref >59)
GLUCOSE: 108 mg/dL — AB (ref 65–99)
Potassium: 4.5 mmol/L (ref 3.5–5.2)
Sodium: 140 mmol/L (ref 134–144)

## 2016-09-23 NOTE — Progress Notes (Signed)
Follow-up Outpatient Visit Date: 09/23/2016  Primary Care Provider: Alysia Penna, MD [address] [place] [address]  Chief Complaint: Follow-up coronary artery disease and aortic valve replacement  HPI:  Jonathan Jones is a 56 y.o. year-old male with history of bicuspid aortic valve and coronary artery disease status post bioprosthetic aortic valve replacement and CABG in 2009, who presents for follow-up. He was previously followed by Dr. Shirlee Latch and was last seen by Wynell Balloon in 09/2015. Since that time, Jonathan Jones has done well. He continues to have episodes of shooting chest pains that radiate across the chest and lasts less than 30 seconds. These are nonexertional and do not have any clear precipitants. Jonathan Jones denies shortness of breath, lightheadedness/dizziness, palpitations, orthopnea, PND, and edema. He remains active, walking at least 3 miles a day, 5-6 days per week. He also does some stretching and resistance exercises. He has not had any significant bleeding. He uses prophylactic antibiotics before dental procedures.  -------------------------------------------------------------------------------------------------- Past Medical History:  Diagnosis Date  . Aortic stenosis   . Arthritis    hands  . Coronary artery disease   . GERD (gastroesophageal reflux disease)   . Heart murmur   . Hypertension    Unspecified  . Migraine headache   . Mixed hyperlipidemia   . Thoracic aortic aneurysm (HCC)    4.4 cm ascending aorta (06/2016)   Past Surgical History:  Procedure Laterality Date  . AORTIC VALVE REPLACEMENT  01/25/2007   25 mm [facility] pericardial tissue valve  . COLONOSCOPY WITH PROPOFOL N/A 05/10/2016   Procedure: COLONOSCOPY WITH PROPOFOL;  Surgeon: Charolett Bumpers, MD;  Location: [facility];  Service: Endoscopy;  Laterality: N/A;  . colonscopy    . CORONARY ARTERY BYPASS GRAFT  01/2007   x3 (LIMA->LAD, SVG->D, SVG->PDA)  . WISDOM TOOTH EXTRACTION  1980      Current Meds  Medication Sig  . aspirin EC 325 MG tablet Take 325 mg by mouth daily.    Marland Kitchen atorvastatin (LIPITOR) 40 MG tablet TAKE 1 TABLET(40 MG) BY MOUTH DAILY (Patient taking differently: TAKE 1 TABLET(40 MG) BY MOUTH DAILY AT BEDTIME)  . calcium carbonate (TUMS - DOSED IN MG ELEMENTAL CALCIUM) 500 MG chewable tablet Chew 2-3 tablets by mouth daily as needed for indigestion or heartburn.  . carvedilol (COREG) 25 MG tablet TAKE 1 TABLET(25 MG) BY MOUTH TWICE DAILY WITH A MEAL.  Marland Kitchen doxepin (SINEQUAN) 75 MG capsule take s once daily at bedtime  . fenofibrate (TRICOR) 145 MG tablet Take 145 mg by mouth daily.  . fluticasone (FLONASE) 50 MCG/ACT nasal spray Place 1 spray into both nostrils daily as needed for allergies or rhinitis.  Marland Kitchen ibuprofen (ADVIL,MOTRIN) 200 MG tablet Take 400-800 mg by mouth daily as needed for moderate pain.  Marland Kitchen loratadine (CLARITIN) 10 MG tablet Take 10 mg by mouth daily as needed for allergies.  . meloxicam (MOBIC) 15 MG tablet Take s by mouth up to two times daily as needed for pain  . PRESCRIPTION MEDICATION Apply 1 application topically daily as needed (psoriasis). Steroid cream  . Tetrahydrozoline HCl (VISINE OP) Apply 1 drop to eye daily as needed (redness/dry eyes).    Allergies: Patient has no known allergies.  Social History   Social History  . Marital status: Married    Spouse name: N/A  . Number of children: N/A  . Years of education: N/A   Occupational History  . Full time    Social History Main Topics  .  Smoking status: Never Smoker  . Smokeless tobacco: Never Used     Comment: Tobacco use-no  . Alcohol use Yes     Comment: Occasionally  . Drug use: Yes    Types: Marijuana     Comment: marijuana use a few weeks ago  . Sexual activity: Not on file   Other Topics Concern  . Not on file   Social History Narrative  . No narrative on file    Family History  Problem Relation Age of Onset  . Alzheimer's disease Father   .  Coronary artery disease Other   . Cancer Other 91  . Other Sister 50       brain tumor    Review of Systems: A 12-system review of systems was performed and was negative except as noted in the HPI.  --------------------------------------------------------------------------------------------------  Physical Exam: BP 124/74   Pulse 72   Ht 6' (1.829 m)   Wt 186 lb (84.4 kg)   BMI 25.23 kg/m   General:  Well-developed, well-nourished man, seated comfortably in the exam room. HEENT: No conjunctival pallor or scleral icterus. Moist mucous membranes.  OP clear. Neck: Supple without lymphadenopathy, thyromegaly, JVD, or HJR. No carotid bruit. Lungs: Normal work of breathing. Clear to auscultation bilaterally without wheezes or crackles. Heart: Regular rate and rhythm with normal S1 and S2. 2/6 crescendo-decrescendo systolic murmur heard loudest at the right upper sternal border. No rubs or gallops. Nondisplaced PMI. Well-healed median sternotomy incision. Abd: Bowel sounds present. Soft, NT/ND without hepatosplenomegaly Ext: No lower extremity edema. Radial, PT, and DP pulses are 2+ bilaterally. Skin: Warm and dry without rash.  EKG:  Normal sinus rhythm with first-degree AV block, borderline LVH, and inferolateral T wave inversions and ST depressions. ST/T changes are minimally more pronounced compared with prior EKG from 06/17/14.  Lab Results  Component Value Date   WBC 7.8 09/24/2015   HGB 14.6 09/24/2015   HCT 43.4 09/24/2015   MCV 88.6 09/24/2015   PLT 236 09/24/2015    Lab Results  Component Value Date   NA 140 09/23/2016   K 4.5 09/23/2016   CL 100 09/23/2016   CO2 24 09/23/2016   BUN 26 (H) 09/23/2016   CREATININE 1.26 09/23/2016   GLUCOSE 108 (H) 09/23/2016   ALT 20 09/24/2015    Lab Results  Component Value Date   CHOL 147 09/24/2015   HDL 31 (L) 09/24/2015   LDLCALC 45 09/24/2015   LDLDIRECT 52.0 06/17/2014   TRIG 355 (H) 09/24/2015   CHOLHDL 4.7  09/24/2015   Outside labs (02/03/16): CMP: Sodium 140, potassium 4.5, chloride 103, CO2 25, BUN 21, creatinine 1.2, glucose 104, calcium 9.6, AST 24, ALT 21, alkaline phosphatase 73, total bilirubin 0.5, total protein 7.3, albumin 4.0  CBC: The CBC 8.1, HGB 15.1, HCT 48.4, PLT 313  Lipid panel: Total cholesterol 142, triglycerides 111, HDL 38, LDL 82  --------------------------------------------------------------------------------------------------  ASSESSMENT AND PLAN: Bicuspid aortic valve status post bioprosthetic aortic valve replacement No symptoms to suggest worsening valve function. Echo in 05/2015 was notable for decreased excursion of the leaflet adjacent to the RVOT. We will continue with aspirin and prophylactic antibiotics for any dental procedures. We will repeat an echocardiogram prior to the patient's follow-up visit next year.  Ascending aortic aneurysm Mildly dilated ascending aorta noted by MRA. We will repeat an MRA next June to ensure stability. Continue with lipid and blood pressure control.  Coronary artery disease without angina No symptoms to suggest worsening coronary insufficiency.  Continue with secondary prevention.  Hyperlipidemia Lipid panel from [facility]/2016 [facility] (obtained after today's visit) was notable for an LDL of 82. We will contact the patient to discuss increasing atorvastatin 80 mg daily for goal LDL less than 70.  Hypertension Blood pressure adequately controlled today. Continue carvedilol.  Follow-up: Return to clinic in 1 year.  Yvonne Kendallhristopher Symphoni Helbling, MD 09/23/2016 6:22 PM

## 2016-09-23 NOTE — Patient Instructions (Signed)
Medication Instructions:  Your physician recommends that you continue on your current medications as directed. Please refer to the Current Medication list given to you today.   Labwork: BMET  Testing/Procedures: Your physician has requested that you have an echocardiogram. Echocardiography is a painless test that uses sound waves to create images of your heart. It provides your doctor with information about the size and shape of your heart and how well your heart's chambers and valves are working. This procedure takes approximately one hour. There are no restrictions for this procedure.  Your physician has requested that you have an MRA of your chest.  Follow-Up: Your physician wants you to follow-up in: 1 year with Dr. Okey DupreEnd. You will receive a reminder letter in the mail two months in advance. If you don't receive a letter, please call our office to schedule the follow-up appointment.   Any Other Special Instructions Will Be Listed Below (If Applicable).     If you need a refill on your cardiac medications before your next appointment, please call your pharmacy.

## 2016-09-26 MED ORDER — ATORVASTATIN CALCIUM 80 MG PO TABS: 80.0000 mg | ORAL_TABLET | Freq: Every day | ORAL | 1 refills | Status: DC

## 2016-09-26 NOTE — Addendum Note (Signed)
Addended by: Jacqlyn KraussLANKFORD, Brexton Sofia M on: 09/26/2016 11:11 AM   Modules accepted: Orders

## 2016-10-10 ENCOUNTER — Ambulatory Visit (HOSPITAL_COMMUNITY): Payer: BLUE CROSS/BLUE SHIELD

## 2016-10-10 ENCOUNTER — Other Ambulatory Visit (HOSPITAL_COMMUNITY): Payer: BLUE CROSS/BLUE SHIELD

## 2016-10-13 ENCOUNTER — Other Ambulatory Visit: Payer: Self-pay

## 2016-10-13 ENCOUNTER — Ambulatory Visit (HOSPITAL_COMMUNITY)
Admission: RE | Admit: 2016-10-13 | Discharge: 2016-10-13 | Disposition: A | Discharge status: Home / Self Care | Payer: BLUE CROSS/BLUE SHIELD | Source: Ambulatory Visit | Attending: Internal Medicine | Admitting: Internal Medicine

## 2016-10-13 ENCOUNTER — Ambulatory Visit (HOSPITAL_BASED_OUTPATIENT_CLINIC_OR_DEPARTMENT_OTHER): Payer: BLUE CROSS/BLUE SHIELD

## 2016-10-13 DIAGNOSIS — Z952 Presence of prosthetic heart valve: Secondary | ICD-10-CM | POA: Diagnosis not present

## 2016-10-13 DIAGNOSIS — Q231 Congenital insufficiency of aortic valve: Secondary | ICD-10-CM

## 2016-10-13 DIAGNOSIS — I712 Thoracic aortic aneurysm, without rupture, unspecified: Secondary | ICD-10-CM

## 2016-10-13 DIAGNOSIS — E785 Hyperlipidemia, unspecified: Secondary | ICD-10-CM | POA: Diagnosis not present

## 2016-10-13 DIAGNOSIS — I10 Essential (primary) hypertension: Secondary | ICD-10-CM | POA: Diagnosis not present

## 2016-10-13 DIAGNOSIS — Z951 Presence of aortocoronary bypass graft: Secondary | ICD-10-CM | POA: Insufficient documentation

## 2016-10-13 DIAGNOSIS — I2510 Atherosclerotic heart disease of native coronary artery without angina pectoris: Secondary | ICD-10-CM | POA: Insufficient documentation

## 2016-10-13 LAB — ECHOCARDIOGRAM COMPLETE
AO mean calculated velocity dopler: 245 cm/s
AOPV: 0.22 m/s
AOVTI: 83.1 cm
AV Mean grad: 27 mmHg
AVCELMEANRAT: 0.22
AVPG: 45 mmHg
AVPKVEL: 337 cm/s
CHL CUP MV DEC (S): 345
CHL CUP RV SYS PRESS: 23 mmHg
E decel time: 345 msec
E/e' ratio: 9.61
FS: 30 % (ref 28–44)
IVS/LV PW RATIO, ED: 1.01
LA ID, A-P, ES: 41 mm
LA diam end sys: 41 mm
LA vol A4C: 68.2 ml
LADIAMINDEX: 1.98 cm/m2
LAVOL: 67.5 mL
LAVOLIN: 32.6 mL/m2
LV PW d: 11.8 mm — AB (ref 0.6–1.1)
LV TDI E'LATERAL: 8.27
LV TDI E'MEDIAL: 5.44
LVEEAVG: 9.61
LVEEMED: 9.61
LVELAT: 8.27 cm/s
LVOT VTI: 21.5 cm
LVOT peak vel: 73.9 cm/s
LVOTVTI: 0.26 cm
Lateral S' vel: 10 cm/s
MV pk A vel: 71.8 m/s
MV pk E vel: 79.5 m/s
MVPG: 3 mmHg
P 1/2 time: 483 ms
Reg peak vel: 223 cm/s
TR max vel: 223 cm/s

## 2016-10-13 MED ORDER — GADOBENATE DIMEGLUMINE 529 MG/ML IV SOLN
20.0000 mL | Freq: Once | INTRAVENOUS | Status: AC | PRN
  Administered 2016-10-13: 20 mL via INTRAVENOUS

## 2016-10-17 ENCOUNTER — Other Ambulatory Visit: Payer: Self-pay | Admitting: Cardiology

## 2016-10-19 NOTE — Telephone Encounter (Signed)
Rx refill sent to pharmacy. 

## 2016-10-23 ENCOUNTER — Emergency Department (HOSPITAL_COMMUNITY): Payer: BLUE CROSS/BLUE SHIELD

## 2016-10-23 ENCOUNTER — Emergency Department (HOSPITAL_COMMUNITY)
Admission: EM | Admit: 2016-10-23 | Discharge: 2016-10-23 | Disposition: A | Discharge status: Home / Self Care | Payer: BLUE CROSS/BLUE SHIELD | Attending: Emergency Medicine | Admitting: Emergency Medicine

## 2016-10-23 ENCOUNTER — Encounter (HOSPITAL_COMMUNITY): Payer: Self-pay | Admitting: *Deleted

## 2016-10-23 DIAGNOSIS — Z79899 Other long term (current) drug therapy: Secondary | ICD-10-CM | POA: Diagnosis not present

## 2016-10-23 DIAGNOSIS — Z951 Presence of aortocoronary bypass graft: Secondary | ICD-10-CM | POA: Insufficient documentation

## 2016-10-23 DIAGNOSIS — S41111A Laceration without foreign body of right upper arm, initial encounter: Secondary | ICD-10-CM | POA: Diagnosis not present

## 2016-10-23 DIAGNOSIS — Y9289 Other specified places as the place of occurrence of the external cause: Secondary | ICD-10-CM | POA: Insufficient documentation

## 2016-10-23 DIAGNOSIS — I2510 Atherosclerotic heart disease of native coronary artery without angina pectoris: Secondary | ICD-10-CM | POA: Diagnosis not present

## 2016-10-23 DIAGNOSIS — Z7982 Long term (current) use of aspirin: Secondary | ICD-10-CM | POA: Insufficient documentation

## 2016-10-23 DIAGNOSIS — Y990 Civilian activity done for income or pay: Secondary | ICD-10-CM | POA: Insufficient documentation

## 2016-10-23 DIAGNOSIS — I10 Essential (primary) hypertension: Secondary | ICD-10-CM | POA: Diagnosis not present

## 2016-10-23 DIAGNOSIS — Y9389 Activity, other specified: Secondary | ICD-10-CM | POA: Diagnosis not present

## 2016-10-23 DIAGNOSIS — S59911A Unspecified injury of right forearm, initial encounter: Secondary | ICD-10-CM | POA: Diagnosis not present

## 2016-10-23 DIAGNOSIS — W268XXA Contact with other sharp object(s), not elsewhere classified, initial encounter: Secondary | ICD-10-CM | POA: Insufficient documentation

## 2016-10-23 DIAGNOSIS — S41011A Laceration without foreign body of right shoulder, initial encounter: Secondary | ICD-10-CM | POA: Diagnosis not present

## 2016-10-23 MED ORDER — BACITRACIN ZINC 500 UNIT/GM EX OINT
1.0000 "application " | TOPICAL_OINTMENT | Freq: Two times a day (BID) | CUTANEOUS | Status: DC
  Administered 2016-10-23: 1 via TOPICAL

## 2016-10-23 MED ORDER — BACITRACIN ZINC 500 UNIT/GM EX OINT: 1.0000 "application " | TOPICAL_OINTMENT | Freq: Two times a day (BID) | CUTANEOUS | 1 refills | Status: DC

## 2016-10-23 MED ORDER — TETANUS-DIPHTH-ACELL PERTUSSIS 5-2.5-18.5 LF-MCG/0.5 IM SUSP
0.5000 mL | Freq: Once | INTRAMUSCULAR | Status: DC
  Filled 2016-10-23: qty 0.5

## 2016-10-23 MED ORDER — LIDOCAINE-EPINEPHRINE (PF) 2 %-1:200000 IJ SOLN
10.0000 mL | Freq: Once | INTRAMUSCULAR | Status: AC
  Administered 2016-10-23: 10 mL
  Filled 2016-10-23: qty 20

## 2016-10-23 NOTE — ED Provider Notes (Signed)
[facility]-EMERGENCY DEPT Provider Note   CSN: [id number] Arrival date & time: 10/23/16  1044     History   Chief Complaint Chief Complaint  Patient presents with  . Laceration    HPI JRU PENSE is a 56 y.o. male.  GREGORY BARRICK is a 56 y.o. Male  who presents to ED with posterior right upper arm laceration. 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 Bleeding was stopped with pressure from a rag. Patient takes  Aspirin once daily, no other blood thinners. Not sure when his last tetanus injection was. Denies weakness, lightheadedness, dizziness, or parathesias.       Past Medical History:  Diagnosis Date  . Aortic stenosis   . Arthritis    hands  . Coronary artery disease   . GERD (gastroesophageal reflux disease)   . Heart murmur   . Hypertension    Unspecified  . Migraine headache   . Mixed hyperlipidemia   . Thoracic aortic aneurysm (HCC)    4.4 cm ascending aorta (06/2016)    Patient Active Problem List   Diagnosis Date Noted  . Bicuspid aortic valve 09/23/2016  . S/P AVR 09/23/2016  . Thoracic aortic aneurysm without rupture (HCC) 09/23/2016  . CAD (coronary artery disease) of artery bypass graft 06/18/2014  . Coronary artery disease involving native coronary artery of native heart without angina pectoris 02/01/2011  . FATIGUE 09/15/2008  . Hyperlipidemia LDL goal <70 05/31/2008  . ANEMIA, SECONDARY TO BLOOD LOSS 05/31/2008  . MIGRAINE HEADACHE 05/31/2008  . Essential hypertension 05/31/2008  . Aortic valve disorder 05/31/2008    Past Surgical History:  Procedure Laterality Date  . AORTIC VALVE REPLACEMENT  01/25/2007   25 mm [facility] pericardial tissue valve  . COLONOSCOPY WITH PROPOFOL N/A 05/10/2016   Procedure: COLONOSCOPY WITH PROPOFOL;  Surgeon: Charolett Bumpers, MD;  Location: [facility] END[facility];  Service: Endoscopy;  Laterality: N/A;  . colonscopy    . CORONARY ARTERY BYPASS  GRAFT  01/2007   x3 (LIMA->LAD, SVG->D, SVG->PDA)  . WISDOM TOOTH EXTRACTION  1980       Home Medications    Prior to Admission medications   Medication Sig Start Date End Date Taking? Authorizing Provider  aspirin EC 325 MG tablet Take 325 mg by mouth daily.      [provider]  atorvastatin (LIPITOR) 80 MG tablet Take 1 tablet (80 mg total) by mouth daily. 09/26/16 12/25/16  End, Cristal Deer, MD  bacitracin ointment Apply 1 application topically 2 (two) times daily. 10/23/16   Everlene Farrier, PA-C  calcium carbonate (TUMS - DOSED IN MG ELEMENTAL CALCIUM) 500 MG chewable tablet Chew 2-3 tablets by mouth daily as needed for indigestion or heartburn.    [provider]  carvedilol (COREG) 25 MG tablet TAKE 1 TABLET(25 MG) BY MOUTH TWICE DAILY WITH A MEAL 10/19/16   Laurey Morale, MD  doxepin [facility]) 75 MG capsule take s once daily at bedtime 09/25/15   [provider]  fenofibrate (TRICOR) 145 MG tablet Take 145 mg by mouth daily.    [provider]  fluticasone (FLONASE) 50 MCG/ACT nasal spray Place 1 spray into both nostrils daily as needed for allergies or rhinitis.    [provider]  ibuprofen (ADVIL,MOTRIN) 200 MG tablet Take 400-800 mg by mouth daily as needed for moderate pain.    [provider]  loratadine (CLARITIN) 10 MG tablet Take  10 mg by mouth daily as needed for allergies.    [provider]  meloxicam (MOBIC) 15 MG tablet Take s by mouth up to two times daily as needed for pain 10/12/15   [provider]  PRESCRIPTION MEDICATION Apply 1 application topically daily as needed (psoriasis). Steroid cream    [provider]  Tetrahydrozoline HCl (VISINE OP) Apply 1 drop to eye daily as needed (redness/dry eyes).    [provider]    Family History Family History  Problem Relation Age of Onset  . Alzheimer's disease Father   . Coronary artery disease Other   . Cancer Other 91   . Other Sister 96       brain tumor    Social History Social History  Substance Use Topics  . Smoking status: Never Smoker  . Smokeless tobacco: Never Used     Comment: Tobacco use-no  . Alcohol use Yes     Comment: Occasionally     Allergies   Patient has no known allergies.   Review of Systems Review of Systems  Constitutional: Negative for fever.  Musculoskeletal: Negative for arthralgias.  Skin: Positive for wound. Negative for pallor.  Neurological: Negative for dizziness, weakness, light-headedness and numbness.     Physical Exam Updated Vital Signs BP (!) 152/103 (BP Location: Left Arm)   Pulse 79   Temp 97.9 F (36.6 C) (Oral)   Resp 17   SpO2 99%   Physical Exam  Constitutional: He appears well-developed and well-nourished. No distress.  Patient appears non-toxic.  HENT:  Head: Normocephalic and atraumatic.  Eyes: Right eye exhibits no discharge. Left eye exhibits no discharge.  Cardiovascular: Normal rate, regular rhythm and intact distal pulses.   Radial pulses 2+ BL.  Pulmonary/Chest: Effort normal. No respiratory distress.  Musculoskeletal: Normal range of motion. He exhibits no edema, tenderness or deformity.  Full ROM BL UE. Strength 5/5 BL UE.  Neurological: He is alert. No sensory deficit. Coordination normal.  Sensation intact BL UE.    Skin: Skin is warm and dry. Capillary refill takes less than 2 seconds. No rash noted. He is not diaphoretic. No erythema. No pallor.  7 cm linear laceration on posterior distal aspect of right upper arm. Bleeding has stopped. No ecchymosis around laceration. No evidence of bone deformity.  Psychiatric: He has a normal mood and affect. His behavior is normal.  Nursing note and vitals reviewed.    ED Treatments / Results  Labs (all labs ordered are listed, but only abnormal results are displayed) Labs Reviewed - No data to display  EKG  EKG Interpretation None       Radiology Dg Humerus  Right  Result Date: 10/23/2016 CLINICAL DATA:  Pt reports slipping and cutting posterior right upper arm on metal today. Bleeding is controlled, large laceration noted to mid-shaft of right humerus. EXAM: RIGHT HUMERUS - 2+ VIEW COMPARISON:  None. FINDINGS: No fracture or bone lesion. The shoulder and elbow joints are normally spaced and aligned. No radiopaque foreign body. IMPRESSION: No fracture or dislocation.  No radiopaque foreign body. Electronically Signed   By: Amie Portland M.D.   On: 10/23/2016 12:24    Procedures .Marland KitchenLaceration Repair Date/Time: 10/23/2016 12:38 PM Performed by: Everlene Farrier Authorized by: Everlene Farrier   Consent:    Consent obtained:  Verbal   Consent given by:  Patient   Risks discussed:  Infection, need for additional repair, pain, poor cosmetic result and poor wound healing Anesthesia (see MAR for  exact dosages):    Anesthesia method:  Local infiltration   Local anesthetic:  Lidocaine 1% WITH epi Laceration details:    Location:  Shoulder/arm   Shoulder/arm location:  R upper arm   Length (cm):  7   Depth (mm):  10 Repair type:    Repair type:  Intermediate Pre-procedure details:    Preparation:  Patient was prepped and draped in usual sterile fashion and imaging obtained to evaluate for foreign bodies Exploration:    Hemostasis achieved with:  Direct pressure   Wound exploration: wound explored through full range of motion and entire depth of wound probed and visualized     Wound extent: no foreign bodies/material noted, no muscle damage noted, no nerve damage noted, no tendon damage noted and no underlying fracture noted   Treatment:    Area cleansed with:  Saline   Amount of cleaning:  Extensive   Irrigation solution:  Sterile saline   Irrigation method:  Pressure wash Skin repair:    Repair method:  Sutures   Suture size:  4-0   Suture material:  Prolene   Suture technique:  Simple interrupted   Number of sutures:  7 Approximation:     Approximation:  Close Post-procedure details:    Dressing:  Antibiotic ointment and non-adherent dressing   Patient tolerance of procedure:  Tolerated well, no immediate complications   (including critical care time)  Medications Ordered in ED Medications  Tdap (BOOSTRIX) injection 0.5 mL (0.5 mLs Intramuscular Refused 10/23/16 1203)  bacitracin ointment 1 application (not administered)  lidocaine-EPINEPHrine (XYLOCAINE W/EPI) 2 %-1:200000 (PF) injection 10 mL (10 mLs Infiltration Given 10/23/16 1205)     Initial Impression / Assessment and Plan / ED Course  I have reviewed the triage vital signs and the nursing notes.  Pertinent labs & imaging results that were available during my care of the patient were reviewed by me and considered in my medical decision making (see chart for details).    This  is a 56 y.o. Male  who presents to ED with posterior right upper arm laceration. 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 Bleeding was stopped with pressure from a rag. Patient takes  Aspirin once daily, no other blood thinners. Not sure when his last tetanus injection was. Will update in the emergency department today. Patient has a 7 cm laceration to the posterior aspect of his right upper arm. Bleeding is controlled. X-ray is unremarkable of the area. No foreign bodies or fracture. Laceration repaired by PA student Hailey under my supervision. Procedure tolerated well. Sutures removed in about 7 days. Discussed wound care instructions. Return precautions discussed. I advised the patient to follow-up with their primary care provider this week. I advised the patient to return to the emergency department with new or worsening symptoms or new concerns. The patient verbalized understanding and agreement with plan.      Final Clinical Impressions(s) / ED Diagnoses   Final diagnoses:  Laceration of right upper extremity,  initial encounter    New Prescriptions New Prescriptions   BACITRACIN OINTMENT    Apply 1 application topically 2 (two) times daily.     Everlene Farrier, PA-C 10/23/16 1311    Shaune Pollack, MD 10/24/16 2118

## 2016-10-23 NOTE — ED Notes (Signed)
Declined W/C at D/C and was escorted to lobby by RN. 

## 2016-10-23 NOTE — ED Triage Notes (Signed)
Pt reports slipping and cutting posterior right upper arm on metal. Bleeding is controlled, large laceration noted.

## 2016-10-23 NOTE — Discharge Instructions (Signed)
Stiches out in about 7 days. There are a total of 7 stitches.

## 2016-11-02 DIAGNOSIS — Z4802 Encounter for removal of sutures: Secondary | ICD-10-CM | POA: Diagnosis not present

## 2016-11-02 DIAGNOSIS — Z6826 Body mass index (BMI) 26.0-26.9, adult: Secondary | ICD-10-CM | POA: Diagnosis not present

## 2016-12-06 ENCOUNTER — Other Ambulatory Visit: Payer: Self-pay | Admitting: Cardiology

## 2017-01-03 ENCOUNTER — Other Ambulatory Visit: Payer: BLUE CROSS/BLUE SHIELD

## 2017-01-09 ENCOUNTER — Encounter (INDEPENDENT_AMBULATORY_CARE_PROVIDER_SITE_OTHER): Payer: Self-pay

## 2017-01-09 ENCOUNTER — Other Ambulatory Visit: Payer: BLUE CROSS/BLUE SHIELD | Admitting: *Deleted

## 2017-01-09 DIAGNOSIS — I712 Thoracic aortic aneurysm, without rupture, unspecified: Secondary | ICD-10-CM

## 2017-01-09 DIAGNOSIS — I2510 Atherosclerotic heart disease of native coronary artery without angina pectoris: Secondary | ICD-10-CM

## 2017-01-09 DIAGNOSIS — Z952 Presence of prosthetic heart valve: Secondary | ICD-10-CM

## 2017-01-09 DIAGNOSIS — E785 Hyperlipidemia, unspecified: Secondary | ICD-10-CM

## 2017-01-09 DIAGNOSIS — Q231 Congenital insufficiency of aortic valve: Secondary | ICD-10-CM

## 2017-01-09 DIAGNOSIS — I10 Essential (primary) hypertension: Secondary | ICD-10-CM | POA: Diagnosis not present

## 2017-01-09 LAB — LIPID PANEL
CHOLESTEROL TOTAL: 168 mg/dL (ref 100–199)
Chol/HDL Ratio: 4.8 ratio (ref 0.0–5.0)
HDL: 35 mg/dL — ABNORMAL LOW (ref >39)
LDL Calculated: 67 mg/dL (ref 0–99)
Triglycerides: 329 mg/dL — ABNORMAL HIGH (ref 0–149)
VLDL Cholesterol Cal: 66 mg/dL — ABNORMAL HIGH (ref 5–40)

## 2017-01-09 LAB — ALT: ALT: 26 IU/L (ref 0–44)

## 2017-01-16 ENCOUNTER — Telehealth: Payer: Self-pay | Admitting: *Deleted

## 2017-01-16 MED ORDER — FENOFIBRATE 145 MG PO TABS: 145.0000 mg | ORAL_TABLET | Freq: Every day | ORAL | 3 refills | Status: DC

## 2017-01-16 NOTE — Telephone Encounter (Signed)
Pt has been notified of lab results by phone with verbal understanding. Advised pt to continue on current dose of fenofibrate. Pt states to me that he has not taken fenofibrate for about 3 months. He is still taking Atorvastatin though. Pt also advised he could try fish oil 2 gm daily. Pt said he would like to go back on the fenofibrate, Rx has been called in. Pt would like to have repeat labs in a few months. I advised pt I will d/w this with Dr. Okey DupreEnd as to when he would like to do lab work. Pt has thanked me for my call today.

## 2017-01-16 NOTE — Telephone Encounter (Signed)
-----   Message from Yvonne Kendallhristopher End, MD sent at 01/12/2017  9:24 PM EST ----- Please let Mr. Jonathan Jones know that his LDL has improved and is now at goal. He should continue his current dose of atorvastatin. Triglycerides remain somewhat elevated. He should continue his current dose of fenofibrate. He could consider starting fish oil 2 gm daily.

## 2017-01-18 ENCOUNTER — Telehealth: Payer: Self-pay | Admitting: *Deleted

## 2017-01-18 DIAGNOSIS — E785 Hyperlipidemia, unspecified: Secondary | ICD-10-CM

## 2017-01-18 DIAGNOSIS — I2510 Atherosclerotic heart disease of native coronary artery without angina pectoris: Secondary | ICD-10-CM

## 2017-01-18 NOTE — Telephone Encounter (Signed)
-----   Message from Yvonne Kendallhristopher End, MD sent at 01/17/2017  4:11 PM EST ----- It is fine to restart fenofibrate, as long as Mr. Louanne SkyeDick has not had any side effects in the past. He should let us know immediately if develops any myalgias. We should also check a total CK with lipid panel and ALT in ~6 weeks.

## 2017-01-18 NOTE — Telephone Encounter (Signed)
I tried to reach pt to advise will need to schedule lab appt per Dr. Okey DupreEnd. See previous note. I will place lab orders, pt just needs to schedule 6 week lab appt. I did leave detailed message for pt that this will be fasting lab work, with directions nothing to eat or drink after midnight the night before lab work, morning of lab work can take medications with water.

## 2017-01-19 ENCOUNTER — Telehealth: Payer: Self-pay | Admitting: *Deleted

## 2017-01-19 NOTE — Telephone Encounter (Signed)
Notes recorded by End, Cristal Deerhristopher, MD on 01/17/2017 at 4:11 PM EST It is fine to restart fenofibrate, as long as Mr. Louanne SkyeDick has not had any side effects in the past. He should let us know immediately if develops any myalgias. We should also check a total CK with lipid panel and ALT in ~6 weeks.

## 2017-01-23 DIAGNOSIS — R51 Headache: Secondary | ICD-10-CM | POA: Diagnosis not present

## 2017-01-23 DIAGNOSIS — G43111 Migraine with aura, intractable, with status migrainosus: Secondary | ICD-10-CM | POA: Diagnosis not present

## 2017-02-06 DIAGNOSIS — Z0000 Encounter for general adult medical examination without abnormal findings: Secondary | ICD-10-CM | POA: Diagnosis not present

## 2017-02-06 DIAGNOSIS — F4323 Adjustment disorder with mixed anxiety and depressed mood: Secondary | ICD-10-CM | POA: Diagnosis not present

## 2017-02-06 DIAGNOSIS — I10 Essential (primary) hypertension: Secondary | ICD-10-CM | POA: Diagnosis not present

## 2017-02-06 DIAGNOSIS — Z125 Encounter for screening for malignant neoplasm of prostate: Secondary | ICD-10-CM | POA: Diagnosis not present

## 2017-02-13 DIAGNOSIS — I10 Essential (primary) hypertension: Secondary | ICD-10-CM | POA: Diagnosis not present

## 2017-02-13 DIAGNOSIS — F4323 Adjustment disorder with mixed anxiety and depressed mood: Secondary | ICD-10-CM | POA: Diagnosis not present

## 2017-02-13 DIAGNOSIS — Z1389 Encounter for screening for other disorder: Secondary | ICD-10-CM | POA: Diagnosis not present

## 2017-02-13 DIAGNOSIS — E7849 Other hyperlipidemia: Secondary | ICD-10-CM | POA: Diagnosis not present

## 2017-02-13 DIAGNOSIS — I25728 Atherosclerosis of autologous artery coronary artery bypass graft(s) with other forms of angina pectoris: Secondary | ICD-10-CM | POA: Diagnosis not present

## 2017-02-13 DIAGNOSIS — Z0000 Encounter for general adult medical examination without abnormal findings: Secondary | ICD-10-CM | POA: Diagnosis not present

## 2017-02-13 DIAGNOSIS — G43909 Migraine, unspecified, not intractable, without status migrainosus: Secondary | ICD-10-CM | POA: Diagnosis not present

## 2017-02-17 DIAGNOSIS — Z1212 Encounter for screening for malignant neoplasm of rectum: Secondary | ICD-10-CM | POA: Diagnosis not present

## 2017-03-02 DIAGNOSIS — F4323 Adjustment disorder with mixed anxiety and depressed mood: Secondary | ICD-10-CM | POA: Diagnosis not present

## 2017-03-14 ENCOUNTER — Other Ambulatory Visit: Payer: BLUE CROSS/BLUE SHIELD

## 2017-03-14 DIAGNOSIS — E785 Hyperlipidemia, unspecified: Secondary | ICD-10-CM | POA: Diagnosis not present

## 2017-03-15 DIAGNOSIS — F4323 Adjustment disorder with mixed anxiety and depressed mood: Secondary | ICD-10-CM | POA: Diagnosis not present

## 2017-03-15 LAB — CK TOTAL AND CKMB (NOT AT ARMC)
CK-MB Index: 1.8 ng/mL (ref 0.0–10.4)
Total CK: 108 U/L (ref 24–204)

## 2017-03-15 LAB — LIPID PANEL
CHOL/HDL RATIO: 3.2 ratio (ref 0.0–5.0)
Cholesterol, Total: 114 mg/dL (ref 100–199)
HDL: 36 mg/dL — AB (ref >39)
LDL CALC: 48 mg/dL (ref 0–99)
TRIGLYCERIDES: 151 mg/dL — AB (ref 0–149)
VLDL Cholesterol Cal: 30 mg/dL (ref 5–40)

## 2017-03-15 LAB — ALT: ALT: 21 IU/L (ref 0–44)

## 2017-04-05 ENCOUNTER — Other Ambulatory Visit: Payer: Self-pay | Admitting: Internal Medicine

## 2017-04-05 DIAGNOSIS — Z952 Presence of prosthetic heart valve: Secondary | ICD-10-CM

## 2017-04-05 DIAGNOSIS — I712 Thoracic aortic aneurysm, without rupture, unspecified: Secondary | ICD-10-CM

## 2017-04-05 DIAGNOSIS — Q231 Congenital insufficiency of aortic valve: Secondary | ICD-10-CM

## 2017-04-05 DIAGNOSIS — I2510 Atherosclerotic heart disease of native coronary artery without angina pectoris: Secondary | ICD-10-CM

## 2017-04-05 DIAGNOSIS — E785 Hyperlipidemia, unspecified: Secondary | ICD-10-CM

## 2017-04-05 DIAGNOSIS — I10 Essential (primary) hypertension: Secondary | ICD-10-CM

## 2017-04-05 MED ORDER — ATORVASTATIN CALCIUM 80 MG PO TABS: 80.0000 mg | ORAL_TABLET | Freq: Every day | ORAL | 1 refills | Status: DC

## 2017-04-17 DIAGNOSIS — F4323 Adjustment disorder with mixed anxiety and depressed mood: Secondary | ICD-10-CM | POA: Diagnosis not present

## 2017-08-28 DIAGNOSIS — G43111 Migraine with aura, intractable, with status migrainosus: Secondary | ICD-10-CM | POA: Diagnosis not present

## 2017-08-28 DIAGNOSIS — R51 Headache: Secondary | ICD-10-CM | POA: Diagnosis not present

## 2017-09-26 ENCOUNTER — Other Ambulatory Visit: Payer: Self-pay | Admitting: Internal Medicine

## 2017-09-26 DIAGNOSIS — I2510 Atherosclerotic heart disease of native coronary artery without angina pectoris: Secondary | ICD-10-CM

## 2017-09-26 DIAGNOSIS — E785 Hyperlipidemia, unspecified: Secondary | ICD-10-CM

## 2017-09-26 DIAGNOSIS — Q231 Congenital insufficiency of aortic valve: Secondary | ICD-10-CM

## 2017-09-26 DIAGNOSIS — Z952 Presence of prosthetic heart valve: Secondary | ICD-10-CM

## 2017-09-26 DIAGNOSIS — I712 Thoracic aortic aneurysm, without rupture, unspecified: Secondary | ICD-10-CM

## 2017-09-26 DIAGNOSIS — I10 Essential (primary) hypertension: Secondary | ICD-10-CM

## 2017-10-13 ENCOUNTER — Other Ambulatory Visit: Payer: Self-pay | Admitting: Cardiology

## 2017-10-15 NOTE — Progress Notes (Signed)
Follow-up Outpatient Visit Date: 10/16/2017  Primary Care Provider: Alysia Penna, MD [address] [place] [address]  Chief Complaint: Dyspnea on exertion  HPI:  Jonathan Jones is a 57 y.o. year-old male with history of bicuspid aortic valve and coronary artery disease status post bioprosthetic aortic valve replacement and CABG in 2009, who presents for follow-up of coronary artery and valvular heart disease.  I last saw Jonathan Jones a year ago, at which time he reported episodic shooting pains across the chest, typically lasting < 30 seconds.  He remained active and did not have any exertional chest pain.  Thoracic MRA in late September, 2018, showed stable moderate dilation of the ascending aorta (4.5 cm).  Today, Jonathan Jones reports that he continues to have episodic shooting pains across the chest, usually once or twice a week.  Pain is not exertional and resolves spontaneously after 30 to 60 seconds.  Over the last 6 months, Jonathan Jones has noticed increased exertional dyspnea, usually when he first starts walking it gradually improves the more activity he does.  He does not have any shortness of breath at rest nor orthopnea or PND.  He denies palpitations, lightheadedness, and edema.  He noted significantly worse exertional dyspnea leading up to his aortic valve replacement 10 years ago.  --------------------------------------------------------------------------------------------------  Past Medical History:  Diagnosis Date  . Aortic stenosis   . Arthritis    hands  . Coronary artery disease   . GERD (gastroesophageal reflux disease)   . Heart murmur   . Hypertension    Unspecified  . Migraine headache   . Mixed hyperlipidemia   . Thoracic aortic aneurysm (HCC)    4.4 cm ascending aorta (06/2016)   Past Surgical History:  Procedure Laterality Date  . AORTIC VALVE REPLACEMENT  01/25/2007   25 mm [facility] pericardial tissue valve  . COLONOSCOPY WITH PROPOFOL N/A 05/10/2016   Procedure: COLONOSCOPY WITH PROPOFOL;  Surgeon: Charolett Bumpers, MD;  Location: [facility];  Service: Endoscopy;  Laterality: N/A;  . colonscopy    . CORONARY ARTERY BYPASS GRAFT  01/2007   x3 (LIMA->LAD, SVG->D, SVG->PDA)  . WISDOM TOOTH EXTRACTION  1980    Recent CV Pertinent Labs: Lab Results  Component Value Date   CHOL 114 03/14/2017   HDL 36 (L) 03/14/2017   LDLCALC 48 03/14/2017   LDLDIRECT 52.0 06/17/2014   TRIG 151 (H) 03/14/2017   CHOLHDL 3.2 03/14/2017   CHOLHDL 4.7 09/24/2015   INR 1.6 (H) 01/25/2007   K 4.5 09/23/2016   MG 2.5 01/26/2007   BUN 26 (H) 09/23/2016   CREATININE 1.26 09/23/2016   CREATININE 1.00 09/24/2015    Past medical and surgical history were reviewed and updated in EPIC.  Current Meds  Medication Sig  . amoxicillin (AMOXIL) 500 MG capsule Take four (4) capsules by mouth as needed one (1) hour prior to dental procedures.  Marland Kitchen aspirin EC 325 MG tablet Take 325 mg by mouth daily.    Marland Kitchen atorvastatin (LIPITOR) 80 MG tablet TAKE 1 TABLET(80 MG) BY MOUTH DAILY  . bacitracin ointment Apply 1 application topically 2 (two) times daily.  . calcium carbonate (TUMS - DOSED IN MG ELEMENTAL CALCIUM) 500 MG chewable tablet Chew 2-3 tablets by mouth daily as needed for indigestion or heartburn.  . carvedilol (COREG) 25 MG tablet TAKE 1 TABLET(25 MG) BY MOUTH TWICE DAILY WITH A MEAL  . doxepin (SINEQUAN) 75 MG capsule take 75mg s once daily at bedtime  . fenofibrate (TRICOR) 145  MG tablet Take 145 mg by mouth daily.  . fluticasone (FLONASE) 50 MCG/ACT nasal spray Place 1 spray into both nostrils daily as needed for allergies or rhinitis.  Marland Kitchen ibuprofen (ADVIL,MOTRIN) 200 MG tablet Take 400-800 mg by mouth daily as needed for moderate pain.  Marland Kitchen loratadine (CLARITIN) 10 MG tablet Take 10 mg by mouth daily as needed for allergies.  . meloxicam (MOBIC) 15 MG tablet Take 15mg s by mouth up to two times daily as needed for pain  . PRESCRIPTION MEDICATION Apply 1 application  topically daily as needed (psoriasis). Steroid cream  . Tetrahydrozoline HCl (VISINE OP) Apply 1 drop to eye daily as needed (redness/dry eyes).    Allergies: Patient has no known allergies.  Social History   Tobacco Use  . Smoking status: Never Smoker  . Smokeless tobacco: Never Used  . Tobacco comment: Tobacco use-no  Substance Use Topics  . Alcohol use: Yes    Comment: Occasionally  . Drug use: Yes    Types: Marijuana    Comment: marijuana use a few weeks ago    Family History  Problem Relation Age of Onset  . Alzheimer's disease Father   . Coronary artery disease Other   . Cancer Other 91  . Other Sister 50       brain tumor    Review of Systems: A 12-system review of systems was performed and was negative except as noted in the HPI.  --------------------------------------------------------------------------------------------------  Physical Exam: BP (!) 146/90   Pulse (!) 57   Ht 6' (1.829 m)   Wt 188 lb (85.3 kg)   BMI 25.50 kg/m   General: NAD. HEENT: No conjunctival pallor or scleral icterus. Moist mucous membranes.  OP clear. Neck: Supple without lymphadenopathy, thyromegaly, JVD, or HJR. No carotid bruit. Lungs: Normal work of breathing. Clear to auscultation bilaterally without wheezes or crackles. Heart: Bradycardic but regular rhythm with 3/6 crescendo-decrescendo systolic murmur loudest at the right upper sternal border.  S2 is appreciated.  No rubs or gallops.  Nondisplaced PMI.  Abd: Bowel sounds present. Soft, NT/ND without hepatosplenomegaly Ext: No lower extremity edema. Radial, PT, and DP pulses are 2+ bilaterally. Skin: Warm and dry without rash.  EKG: Sinus bradycardia (heart rate 52 bpm) with first-degree AV block and inferolateral T wave inversions, less pronounced than on prior EKG from 09/23/2016.  Lab Results  Component Value Date   WBC 7.8 09/24/2015   HGB 14.6 09/24/2015   HCT 43.4 09/24/2015   MCV 88.6 09/24/2015   PLT 236  09/24/2015    Lab Results  Component Value Date   NA 140 09/23/2016   K 4.5 09/23/2016   CL 100 09/23/2016   CO2 24 09/23/2016   BUN 26 (H) 09/23/2016   CREATININE 1.26 09/23/2016   GLUCOSE 108 (H) 09/23/2016   ALT 21 03/14/2017    Lab Results  Component Value Date   CHOL 114 03/14/2017   HDL 36 (L) 03/14/2017   LDLCALC 48 03/14/2017   LDLDIRECT 52.0 06/17/2014   TRIG 151 (H) 03/14/2017   CHOLHDL 3.2 03/14/2017    --------------------------------------------------------------------------------------------------  ASSESSMENT AND PLAN: Aortic stenosis status post bioprosthetic aortic valve replacement with dyspnea on exertion Patient has noted mild exertional dyspnea over the last 6 months, concerning for potential valve dysfunction.  He is now 10 years out from his AVR.  3/6 systolic murmur is appreciated on exam today.  We have agreed to obtain a transthoracic echocardiogram for further evaluation.  We will decrease aspirin to 81  mg daily.  SBE prophylaxis reinforced.  Thoracic aortic aneurysm Stable by MRA last year.  We will begin with echo, as above.  Based on visualization by echo, repeat cross-sectional imaging will needed to be considered last this year to ensure stability.  Coronary artery disease with atypical chest pain Long-standing and most consistent with a noncardiac etiology.  However, given shortness of breath, further assessment will be pursued.  We will begin with an echocardiogram.  If there is not evidence of significant valvular pathology, we will subsequently obtain a myocardial perfusion stress test.  Alternatively, if significant valve dysfunction is identified, right and left heart catheterization will to be considered.  Hyperlipidemia LDL at goal on last check in February.  Continue atorvastatin 80 mg daily.  Follow-up: Given my transition to the [place] office, Jonathan Jones will follow up with Dr. Clifton James in 3 months.  Yvonne Kendall,  MD 10/16/2017 9:02 AM

## 2017-10-16 ENCOUNTER — Ambulatory Visit: Payer: BLUE CROSS/BLUE SHIELD | Admitting: Internal Medicine

## 2017-10-16 ENCOUNTER — Encounter: Payer: Self-pay | Admitting: Internal Medicine

## 2017-10-16 ENCOUNTER — Other Ambulatory Visit: Payer: Self-pay | Admitting: Internal Medicine

## 2017-10-16 VITALS — BP 146/90 | HR 57 | Ht 72.0 in | Wt 188.0 lb

## 2017-10-16 DIAGNOSIS — I350 Nonrheumatic aortic (valve) stenosis: Secondary | ICD-10-CM | POA: Diagnosis not present

## 2017-10-16 DIAGNOSIS — R0600 Dyspnea, unspecified: Secondary | ICD-10-CM | POA: Insufficient documentation

## 2017-10-16 DIAGNOSIS — I2510 Atherosclerotic heart disease of native coronary artery without angina pectoris: Secondary | ICD-10-CM

## 2017-10-16 DIAGNOSIS — R0789 Other chest pain: Secondary | ICD-10-CM

## 2017-10-16 DIAGNOSIS — R0609 Other forms of dyspnea: Secondary | ICD-10-CM | POA: Insufficient documentation

## 2017-10-16 DIAGNOSIS — I712 Thoracic aortic aneurysm, without rupture, unspecified: Secondary | ICD-10-CM

## 2017-10-16 DIAGNOSIS — Z952 Presence of prosthetic heart valve: Secondary | ICD-10-CM

## 2017-10-16 DIAGNOSIS — E785 Hyperlipidemia, unspecified: Secondary | ICD-10-CM

## 2017-10-16 MED ORDER — ASPIRIN EC 81 MG PO TBEC: 81.0000 mg | DELAYED_RELEASE_TABLET | Freq: Every day | ORAL | 3 refills | Status: DC

## 2017-10-16 MED ORDER — CARVEDILOL 25 MG PO TABS: ORAL_TABLET | ORAL | 3 refills | Status: DC

## 2017-10-16 NOTE — Patient Instructions (Addendum)
Medication Instructions:  DECREASE Aspirin to 81 mg daily   -- If you need a refill on your cardiac medications before your next appointment, please call your pharmacy. --  Labwork: None ordered  Testing/Procedures: Your physician has requested that you have an echocardiogram. Echocardiography is a painless test that uses sound waves to create images of your heart. It provides your doctor with information about the size and shape of your heart and how well your heart's chambers and valves are working. This procedure takes approximately one hour. There are no restrictions for this procedure.   Follow-Up: Your physician wants you to follow-up in: 3 months with Dr. Lisette Grinder will receive a reminder letter in the mail two months in advance. If you don't receive a letter, please call our office to schedule the follow-up appointment.  Thank you for choosing [facility]!!    Any Other Special Instructions Will Be Listed Below (If Applicable).

## 2017-10-18 ENCOUNTER — Ambulatory Visit (HOSPITAL_COMMUNITY): Payer: BLUE CROSS/BLUE SHIELD | Attending: Cardiology

## 2017-10-18 ENCOUNTER — Other Ambulatory Visit: Payer: Self-pay

## 2017-10-18 DIAGNOSIS — R0600 Dyspnea, unspecified: Secondary | ICD-10-CM | POA: Diagnosis not present

## 2017-10-18 DIAGNOSIS — I10 Essential (primary) hypertension: Secondary | ICD-10-CM | POA: Diagnosis not present

## 2017-10-18 DIAGNOSIS — E785 Hyperlipidemia, unspecified: Secondary | ICD-10-CM | POA: Diagnosis not present

## 2017-10-18 DIAGNOSIS — I350 Nonrheumatic aortic (valve) stenosis: Secondary | ICD-10-CM | POA: Insufficient documentation

## 2017-10-18 DIAGNOSIS — I2510 Atherosclerotic heart disease of native coronary artery without angina pectoris: Secondary | ICD-10-CM | POA: Diagnosis not present

## 2017-10-18 DIAGNOSIS — R0609 Other forms of dyspnea: Secondary | ICD-10-CM | POA: Diagnosis not present

## 2017-10-20 ENCOUNTER — Telehealth: Payer: Self-pay

## 2017-10-20 NOTE — Telephone Encounter (Signed)
-----   Message from Yvonne Kendall, MD sent at 10/20/2017  7:20 AM EDT ----- Please let Jonathan Jones know that his echo shows normal contraction of the left ventricle.  The aortic valve is at least moderately narrowed.  I would like to review the images with some of our valve specialists and will contact him next week regarding further recommendations.  He should continue his current medications and let us know if any symptoms worsen in the meantime.

## 2017-10-20 NOTE — Telephone Encounter (Signed)
Notes recorded by Sigurd Sos, RN on 10/20/2017 at 8:20 AM EDT lpmtcb 10/4 ------

## 2017-10-23 ENCOUNTER — Telehealth: Payer: Self-pay

## 2017-10-23 NOTE — Telephone Encounter (Signed)
-----   Message from Christopher End, MD sent at 10/20/2017  7:20 AM EDT ----- Please let Mr. Barno know that his echo shows normal contraction of the left ventricle.  The aortic valve is at least moderately narrowed.  I would like to review the images with some of our valve specialists and will contact him next week regarding further recommendations.  He should continue his current medications and let us know if any symptoms worsen in the meantime. 

## 2017-10-23 NOTE — Telephone Encounter (Signed)
Notes recorded by Sigurd Sos, RN on 10/23/2017 at 8:51 AM EDT lpmtcb 10/7 ------

## 2017-10-25 ENCOUNTER — Telehealth: Payer: Self-pay

## 2017-10-25 NOTE — Telephone Encounter (Signed)
The patient has been notified of the result and verbalized understanding.  All questions (if any) were answered. Sigurd Sos, RN 10/25/2017 8:30 AM

## 2017-10-26 ENCOUNTER — Telehealth: Payer: Self-pay | Admitting: Internal Medicine

## 2017-10-26 NOTE — Telephone Encounter (Signed)
Unable to reach patient despite multiple calls.  I have left a voicemail asking him to contact me at the [place] office to discuss echocardiogram results and options for further evaluation.  Yvonne Kendall, MD [facility] Pager: [phone]

## 2017-10-27 ENCOUNTER — Telehealth: Payer: Self-pay | Admitting: *Deleted

## 2017-10-27 NOTE — Telephone Encounter (Signed)
Patient calling Dr End back. Advised patient I will route to Dr End to let him know he was returning Dr Serita Kyle call.  Patient is aware it may not be until next week that he hears back.

## 2017-10-27 NOTE — Telephone Encounter (Signed)
error 

## 2017-10-30 ENCOUNTER — Telehealth: Payer: Self-pay

## 2017-10-30 NOTE — Telephone Encounter (Signed)
See earlier phone note

## 2017-10-30 NOTE — Telephone Encounter (Signed)
I spoke with Mr. Jonathan Jones, who reports that he has been feeling well.  He went hiking this weekend without any shortness of breath.  We discussed further evaluation of his bioprosthetic aortic valve with at least moderate stenosis, including TEE and right/left heart catheterization.  Given minimal symptoms, he would like to defer additional testing at this time.  I will have him f/u with Dr. Clifton James in ~3 months, given my transition to the [place] office.  Mr. Jonathan Jones will contact us if his symptoms worsen in the meantime.  Yvonne Kendall, MD [facility] Pager: [phone]

## 2017-10-30 NOTE — Telephone Encounter (Signed)
-----   Message from Yvonne Kendall, MD sent at 10/30/2017 11:28 AM EDT ----- Results d/w patient by phone.  He denies significant shortness of breath and would like to defer additional testing (TEE and L/RHC) for now.  We agreed to have him f/u with Dr. Clifton James in ~3 months to reassess his symptoms.  Casimiro Needle, can you help set up this appointment?  Thanks.

## 2017-10-30 NOTE — Telephone Encounter (Signed)
Notes recorded by Sigurd Sos, RN on 10/30/2017 at 12:10 PM EDT Spoke with patient and set him up for an appt with Dr Clifton James 1/6 @ 8am. He verbalized understanding. ------

## 2017-12-21 ENCOUNTER — Other Ambulatory Visit: Payer: Self-pay | Admitting: Internal Medicine

## 2017-12-21 DIAGNOSIS — I712 Thoracic aortic aneurysm, without rupture, unspecified: Secondary | ICD-10-CM

## 2017-12-21 DIAGNOSIS — Q231 Congenital insufficiency of aortic valve: Secondary | ICD-10-CM

## 2017-12-21 DIAGNOSIS — I10 Essential (primary) hypertension: Secondary | ICD-10-CM

## 2017-12-21 DIAGNOSIS — I2510 Atherosclerotic heart disease of native coronary artery without angina pectoris: Secondary | ICD-10-CM

## 2017-12-21 DIAGNOSIS — E785 Hyperlipidemia, unspecified: Secondary | ICD-10-CM

## 2017-12-21 DIAGNOSIS — Z952 Presence of prosthetic heart valve: Secondary | ICD-10-CM

## 2017-12-21 NOTE — Telephone Encounter (Signed)
Please review for refill, Thanks !  

## 2018-01-08 ENCOUNTER — Other Ambulatory Visit: Payer: Self-pay | Admitting: Internal Medicine

## 2018-01-08 NOTE — Telephone Encounter (Signed)
Refill Request.  

## 2018-01-22 ENCOUNTER — Encounter (INDEPENDENT_AMBULATORY_CARE_PROVIDER_SITE_OTHER): Payer: Self-pay

## 2018-01-22 ENCOUNTER — Encounter: Payer: Self-pay | Admitting: Cardiovascular Disease

## 2018-01-22 ENCOUNTER — Ambulatory Visit: Payer: BLUE CROSS/BLUE SHIELD | Admitting: Cardiovascular Disease

## 2018-01-22 VITALS — BP 146/86 | HR 66 | Ht 72.0 in | Wt 194.6 lb

## 2018-01-22 DIAGNOSIS — I712 Thoracic aortic aneurysm, without rupture, unspecified: Secondary | ICD-10-CM

## 2018-01-22 DIAGNOSIS — I2510 Atherosclerotic heart disease of native coronary artery without angina pectoris: Secondary | ICD-10-CM | POA: Diagnosis not present

## 2018-01-22 DIAGNOSIS — I10 Essential (primary) hypertension: Secondary | ICD-10-CM | POA: Diagnosis not present

## 2018-01-22 DIAGNOSIS — I350 Nonrheumatic aortic (valve) stenosis: Secondary | ICD-10-CM

## 2018-01-22 DIAGNOSIS — E785 Hyperlipidemia, unspecified: Secondary | ICD-10-CM

## 2018-01-22 NOTE — Patient Instructions (Signed)
Medication Instructions:  Your physician recommends that you continue on your current medications as directed. Please refer to the Current Medication list given to you today.  If you need a refill on your cardiac medications before your next appointment, please call your pharmacy.   Lab work: none If you have labs (blood work) drawn today and your tests are completely normal, you will receive your results only by: Marland Kitchen MyChart Message (if you have MyChart) OR . A paper copy in the mail If you have any lab test that is abnormal or we need to change your treatment, we will call you to review the results.  Testing/Procedures: none  Follow-Up: At [facility], you and your health needs are our priority.  As part of our continuing mission to provide you with exceptional heart care, we have created designated Provider Care Teams.  These Care Teams include your primary Cardiologist (physician) and Advanced Practice Providers (APPs -  Physician Assistants and Nurse Practitioners) who all work together to provide you with the care you need, when you need it. You will need a follow up appointment in 6 months.  Please call our office 2 months in advance to schedule this appointment.  You may see Dr. Clifton James or one of the following Advanced Practice Providers on your designated Care Team:   [place], PA-C Ronie Spies, PA-C . Jacolyn Reedy, PA-C  Any Other Special Instructions Will Be Listed Below (If Applicable). Check blood pressure at home and keep record or readings.  Call us if consistently running 140/90 or greater.

## 2018-01-22 NOTE — Progress Notes (Signed)
Chief Complaint  Patient presents with  . Follow-up    CAD   History of Present Illness:58 yo male with history of CAD s/p 3V CABG in 2009, bicuspid aortic valve status post bioprosthetic aortic valve replacement in 2009, HTN, hyperlipidemia and thoracic aortic aneurysm here today for cardiac follow up. He has been followed in our office by Dr. Okey Dupre. Echo in October 2019 with moderate aortic stenosis (mean gradient 27 mmHg). LV systolic function was normal. 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 Thoracic MRA September 2018 with dilation of the ascending aorta at 4.5 cm.   He is here today for follow up. The patient denies any chest pain, dyspnea, palpitations, lower extremity edema, orthopnea, PND, dizziness, near syncope or syncope.   Primary Care Physician: Alysia Penna, MD   Past Medical History:  Diagnosis Date  . Aortic stenosis   . Arthritis    hands  . Coronary artery disease   . GERD (gastroesophageal reflux disease)   . Heart murmur   . Hypertension    Unspecified  . Migraine headache   . Mixed hyperlipidemia   . Thoracic aortic aneurysm (HCC)    4.4 cm ascending aorta (06/2016)    Past Surgical History:  Procedure Laterality Date  . AORTIC VALVE REPLACEMENT  01/25/2007   25 mm [facility] pericardial tissue valve  . COLONOSCOPY WITH PROPOFOL N/A 05/10/2016   Procedure: COLONOSCOPY WITH PROPOFOL;  Surgeon: Charolett Bumpers, MD;  Location: [facility];  Service: Endoscopy;  Laterality: N/A;  . colonscopy    . CORONARY ARTERY BYPASS GRAFT  01/2007   x3 (LIMA->LAD, SVG->D, SVG->PDA)  . WISDOM TOOTH EXTRACTION  1980    Current Outpatient Medications  Medication Sig Dispense Refill  . amoxicillin (AMOXIL) 500 MG capsule Take four (4) capsules by mouth as needed one (1) hour prior to dental procedures.  0  . aspirin EC 81 MG tablet Take 1 tablet (81 mg total) by mouth daily. 90 tablet 3  . atorvastatin (LIPITOR) 80 MG tablet TAKE 1 TABLET(80 MG) BY MOUTH DAILY 90 tablet  2  . bacitracin ointment Apply 1 application topically 2 (two) times daily. 28 g 1  . calcium carbonate (TUMS - DOSED IN MG ELEMENTAL CALCIUM) 500 MG chewable tablet Chew 2-3 tablets by mouth daily as needed for indigestion or heartburn.    . carvedilol (COREG) 25 MG tablet TAKE 1 TABLET(25 MG) BY MOUTH TWICE DAILY WITH A MEAL 180 tablet 3  . doxepin (SINEQUAN) 75 MG capsule take 75mg s once daily at bedtime  11  . fenofibrate (TRICOR) 145 MG tablet TAKE 1 TABLET(145 MG) BY MOUTH DAILY 90 tablet 0  . fluticasone (FLONASE) 50 MCG/ACT nasal spray Place 1 spray into both nostrils daily as needed for allergies or rhinitis.    Marland Kitchen ibuprofen (ADVIL,MOTRIN) 200 MG tablet Take 400-800 mg by mouth daily as needed for moderate pain.    Marland Kitchen loratadine (CLARITIN) 10 MG tablet Take 10 mg by mouth daily as needed for allergies.    . meloxicam (MOBIC) 15 MG tablet Take 15mg s by mouth up to two times daily as needed for pain  3  . PRESCRIPTION MEDICATION Apply 1 application topically daily as needed (psoriasis). Steroid cream    . Tetrahydrozoline HCl (VISINE OP) Apply 1 drop to eye daily as needed (redness/dry eyes).     No current facility-administered medications for this visit.     No Known Allergies  Social History   Socioeconomic History  . Marital status: Married  Spouse name: Not on file  . Number of children: Not on file  . Years of education: Not on file  . Highest education level: Not on file  Occupational History  . Occupation: Full time  Social Needs  . Financial resource strain: Not on file  . Food insecurity:    Worry: Not on file    Inability: Not on file  . Transportation needs:    Medical: Not on file    Non-medical: Not on file  Tobacco Use  . Smoking status: Never Smoker  . Smokeless tobacco: Never Used  . Tobacco comment: Tobacco use-no  Substance and Sexual Activity  . Alcohol use: Yes    Comment: Occasionally  . Drug use: Yes    Types: Marijuana    Comment: marijuana  use a few weeks ago  . Sexual activity: Not on file  Lifestyle  . Physical activity:    Days per week: Not on file    Minutes per session: Not on file  . Stress: Not on file  Relationships  . Social connections:    Talks on phone: Not on file    Gets together: Not on file    Attends religious service: Not on file    Active member of club or organization: Not on file    Attends meetings of clubs or organizations: Not on file    Relationship status: Not on file  . Intimate partner violence:    Fear of current or ex partner: Not on file    Emotionally abused: Not on file    Physically abused: Not on file    Forced sexual activity: Not on file  Other Topics Concern  . Not on file  Social History Narrative  . Not on file    Family History  Problem Relation Age of Onset  . Alzheimer's disease Father   . Coronary artery disease Other   . Cancer Other 91  . Other Sister 50       brain tumor    Review of Systems:  As stated in the HPI and otherwise negative.   BP (!) 146/86   Pulse 66   Ht 6' (1.829 m)   Wt 194 lb 9.6 oz (88.3 kg)   SpO2 97%   BMI 26.39 kg/m   Physical Examination: General: Well developed, well nourished, NAD  HEENT: OP clear, mucus membranes moist  SKIN: warm, dry. No rashes. Neuro: No focal deficits  Musculoskeletal: Muscle strength 5/5 all ext  Psychiatric: Mood and affect normal  Neck: No JVD, no carotid bruits, no thyromegaly, no lymphadenopathy.  Lungs:Clear bilaterally, no wheezes, rhonci, crackles Cardiovascular: Regular rate and rhythm. Systolic murmur noted.  Abdomen:Soft. Bowel sounds present. Non-tender.  Extremities: No lower extremity edema. Pulses are 2 + in the bilateral DP/PT.  EKG:  EKG is not ordered today. The ekg ordered today demonstrates   Recent Labs: 03/14/2017: ALT 21   Lipid Panel    Component Value Date/Time   CHOL 114 03/14/2017 0811   TRIG 151 (H) 03/14/2017 0811   HDL 36 (L) 03/14/2017 0811   CHOLHDL 3.2  03/14/2017 0811   CHOLHDL 4.7 09/24/2015 0806   VLDL 71 (H) 09/24/2015 0806   LDLCALC 48 03/14/2017 0811   LDLDIRECT 52.0 06/17/2014 1505     Wt Readings from Last 3 Encounters:  01/22/18 194 lb 9.6 oz (88.3 kg)  10/16/17 188 lb (85.3 kg)  09/23/16 186 lb (84.4 kg)     Other studies Reviewed: Additional studies/ records  that were reviewed today include:  Review of the above records demonstrates:    Assessment and Plan:   1. Aortic stenosis: He is s/p bioprosthetic aortic valve replacement in 2009. Most recent echo in October 2019 with moderate stenosis of the bioprosthetic aortic valve. He has no recent dyspnea. Continue ASA 81 mg daily. Repeat echo in October 2020.   2. CAD s/p CABG: No exertional chest pain. Continue ASA, statin and beta blocker.   3. HTN: BP is slightly elevated today. He will follow at home and let us know the results.   4. Hyperlipidemia: LDL at goal. Continue statin. Will repeat lipids and LFTS in primary care visit.    5. Thoracic aortic aneurysm: Stable by echo October 2019. Repeat chest MRA October 2020.     Current medicines are reviewed at length with the patient today.  The patient does not have concerns regarding medicines.  The following changes have been made:  no change  Labs/ tests ordered today include:  No orders of the defined types were placed in this encounter.  Disposition:   FU with  in 6 months   Signed, Verne Carrowhristopher Rayson Rando, MD 01/22/2018 9:14 AM    [facility] [address], [place], [place]  [address] Phone: [phone]; Fax: [phone]

## 2018-02-09 DIAGNOSIS — Z125 Encounter for screening for malignant neoplasm of prostate: Secondary | ICD-10-CM | POA: Diagnosis not present

## 2018-02-09 DIAGNOSIS — Z0000 Encounter for general adult medical examination without abnormal findings: Secondary | ICD-10-CM | POA: Diagnosis not present

## 2018-02-09 DIAGNOSIS — R82998 Other abnormal findings in urine: Secondary | ICD-10-CM | POA: Diagnosis not present

## 2018-02-09 DIAGNOSIS — I10 Essential (primary) hypertension: Secondary | ICD-10-CM | POA: Diagnosis not present

## 2018-02-16 DIAGNOSIS — Z952 Presence of prosthetic heart valve: Secondary | ICD-10-CM | POA: Diagnosis not present

## 2018-02-16 DIAGNOSIS — I25728 Atherosclerosis of autologous artery coronary artery bypass graft(s) with other forms of angina pectoris: Secondary | ICD-10-CM | POA: Diagnosis not present

## 2018-02-16 DIAGNOSIS — I10 Essential (primary) hypertension: Secondary | ICD-10-CM | POA: Diagnosis not present

## 2018-02-16 DIAGNOSIS — Z0000 Encounter for general adult medical examination without abnormal findings: Secondary | ICD-10-CM | POA: Diagnosis not present

## 2018-02-16 DIAGNOSIS — I712 Thoracic aortic aneurysm, without rupture: Secondary | ICD-10-CM | POA: Diagnosis not present

## 2018-04-09 ENCOUNTER — Other Ambulatory Visit: Payer: Self-pay

## 2018-04-09 MED ORDER — FENOFIBRATE 145 MG PO TABS: 145.0000 mg | ORAL_TABLET | Freq: Every day | ORAL | 2 refills | Status: DC

## 2018-08-21 DIAGNOSIS — G43111 Migraine with aura, intractable, with status migrainosus: Secondary | ICD-10-CM | POA: Diagnosis not present

## 2018-08-21 DIAGNOSIS — R51 Headache: Secondary | ICD-10-CM | POA: Diagnosis not present

## 2018-08-22 NOTE — Progress Notes (Signed)
Chief Complaint  Patient presents with  . Follow-up    CAD   History of Present Illness: 58 yo male with history of CAD s/p 3V CABG in 2009, bicuspid aortic valve status post bioprosthetic aortic valve replacement in 2009, HTN, hyperlipidemia and thoracic aortic aneurysm here today for cardiac follow up. He has been followed in our office by Dr. Saunders Revel. I met him for the first time in January 2020. Echo in October 2019 with moderate aortic stenosis (mean gradient 27 mmHg). LV systolic function was normal. 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 Thoracic MRA September 2018 with dilation of the ascending aorta at 4.5 cm.   He is here today for follow up. The patient denies any chest pain, dyspnea, palpitations, lower extremity edema, orthopnea, PND, dizziness, near syncope or syncope. He is very active.   Primary Care Physician: Velna Hatchet, MD   Past Medical History:  Diagnosis Date  . Aortic stenosis   . Arthritis    hands  . Coronary artery disease   . GERD (gastroesophageal reflux disease)   . Heart murmur   . Hypertension    Unspecified  . Migraine headache   . Mixed hyperlipidemia   . Thoracic aortic aneurysm (HCC)    4.4 cm ascending aorta (06/2016)    Past Surgical History:  Procedure Laterality Date  . AORTIC VALVE REPLACEMENT  01/25/2007   25 mm [facility] pericardial tissue valve  . COLONOSCOPY WITH PROPOFOL N/A 05/10/2016   Procedure: COLONOSCOPY WITH PROPOFOL;  Surgeon: Garlan Fair, MD;  Location: [facility];  Service: Endoscopy;  Laterality: N/A;  . colonscopy    . CORONARY ARTERY BYPASS GRAFT  01/2007   x3 (LIMA->LAD, SVG->D, SVG->PDA)  . WISDOM TOOTH EXTRACTION  1980    Current Outpatient Medications  Medication Sig Dispense Refill  . amoxicillin (AMOXIL) 500 MG capsule Take four (4) capsules by mouth as needed one (1) hour prior to dental procedures.  0  . aspirin EC 81 MG tablet Take 1 tablet (81 mg total) by mouth daily. 90 tablet 3  . atorvastatin  (LIPITOR) 80 MG tablet TAKE 1 TABLET(80 MG) BY MOUTH DAILY 90 tablet 2  . calcium carbonate (TUMS - DOSED IN MG ELEMENTAL CALCIUM) 500 MG chewable tablet Chew 2-3 tablets by mouth daily as needed for indigestion or heartburn.    . carvedilol (COREG) 25 MG tablet TAKE 1 TABLET(25 MG) BY MOUTH TWICE DAILY WITH A MEAL 180 tablet 3  . doxepin (SINEQUAN) 75 MG capsule take 47ms once daily at bedtime  11  . fenofibrate (TRICOR) 145 MG tablet Take 1 tablet (145 mg total) by mouth daily. 90 tablet 2  . ibuprofen (ADVIL,MOTRIN) 200 MG tablet Take 400-800 mg by mouth daily as needed for moderate pain.    .Marland Kitchenloratadine (CLARITIN) 10 MG tablet Take 10 mg by mouth daily as needed for allergies.    . meloxicam (MOBIC) 15 MG tablet Take 151m by mouth up to two times daily as needed for pain  3  . PRESCRIPTION MEDICATION Apply 1 application topically daily as needed (psoriasis). Steroid cream    . Tetrahydrozoline HCl (VISINE OP) Apply 1 drop to eye daily as needed (redness/dry eyes).     No current facility-administered medications for this visit.     No Known Allergies  Social History   Socioeconomic History  . Marital status: Married    Spouse name: Not on file  . Number of children: Not on file  . Years of education:  Not on file  . Highest education level: Not on file  Occupational History  . Occupation: Full time  Social Needs  . Financial resource strain: Not on file  . Food insecurity    Worry: Not on file    Inability: Not on file  . Transportation needs    Medical: Not on file    Non-medical: Not on file  Tobacco Use  . Smoking status: Never Smoker  . Smokeless tobacco: Never Used  . Tobacco comment: Tobacco use-no  Substance and Sexual Activity  . Alcohol use: Yes    Comment: Occasionally  . Drug use: Yes    Types: Marijuana    Comment: marijuana use a few weeks ago  . Sexual activity: Not on file  Lifestyle  . Physical activity    Days per week: Not on file    Minutes per  session: Not on file  . Stress: Not on file  Relationships  . Social Herbalist on phone: Not on file    Gets together: Not on file    Attends religious service: Not on file    Active member of club or organization: Not on file    Attends meetings of clubs or organizations: Not on file    Relationship status: Not on file  . Intimate partner violence    Fear of current or ex partner: Not on file    Emotionally abused: Not on file    Physically abused: Not on file    Forced sexual activity: Not on file  Other Topics Concern  . Not on file  Social History Narrative  . Not on file    Family History  Problem Relation Age of Onset  . Alzheimer's disease Father   . Coronary artery disease Other   . Cancer Other 91  . Other Sister 60       brain tumor    Review of Systems:  As stated in the HPI and otherwise negative.   BP 114/74   Pulse 62   Ht 6' (1.829 m)   Wt 185 lb 3.2 oz (84 kg)   SpO2 97%   BMI 25.12 kg/m   Physical Examination: General: Well developed, well nourished, NAD  HEENT: OP clear, mucus membranes moist  SKIN: warm, dry. No rashes. Neuro: No focal deficits  Musculoskeletal: Muscle strength 5/5 all ext  Psychiatric: Mood and affect normal  Neck: No JVD, no carotid bruits, no thyromegaly, no lymphadenopathy.  Lungs:Clear bilaterally, no wheezes, rhonci, crackles Cardiovascular: Regular rate and rhythm. Loud, harsh systolic murmur. No gallops or rubs. Abdomen:Soft. Bowel sounds present. Non-tender.  Extremities: No lower extremity edema. Pulses are 2 + in the bilateral DP/PT.  EKG:  EKG is not ordered today. The ekg ordered today demonstrates   Recent Labs: No results found for requested labs within last 8760 hours.   Lipid Panel    Component Value Date/Time   CHOL 114 03/14/2017 0811   TRIG 151 (H) 03/14/2017 0811   HDL 36 (L) 03/14/2017 0811   CHOLHDL 3.2 03/14/2017 0811   CHOLHDL 4.7 09/24/2015 0806   VLDL 71 (H) 09/24/2015 0806    LDLCALC 48 03/14/2017 0811   LDLDIRECT 52.0 06/17/2014 1505     Wt Readings from Last 3 Encounters:  08/23/18 185 lb 3.2 oz (84 kg)  01/22/18 194 lb 9.6 oz (88.3 kg)  10/16/17 188 lb (85.3 kg)     Other studies Reviewed: Additional studies/ records that were reviewed today include:  Review of the above records demonstrates:    Assessment and Plan:   1. Aortic stenosis: He is s/p bioprosthetic aortic valve replacement in 2009. Most recent echo in October 2019 with moderate stenosis of the bioprosthetic aortic valve. He denies dyspnea. Will continue ASA. Plans for repeat echo in October 2020.   2. CAD s/p CABG: No chest pain. Will continue ASA, beta blocker and statin.    3. HTN: BP is controlled.   4. Hyperlipidemia: LDL at goal in primary care January [facility]). Continue statin.   5. Thoracic aortic aneurysm: Stable by echo October 2019. Will repeat chest MRA in October 2020.    Current medicines are reviewed at length with the patient today.  The patient does not have concerns regarding medicines.  The following changes have been made:  no change  Labs/ tests ordered today include:   Orders Placed This Encounter  Procedures  . MR Angiogram Chest W Wo Contrast  . ECHOCARDIOGRAM COMPLETE   Disposition:   FU with  in 12 months  Signed, Lauree Chandler, MD 08/23/2018 9:48 AM    [facility] [address], [place], [place]  [address] Phone: [phone]; Fax: [phone]

## 2018-08-23 ENCOUNTER — Other Ambulatory Visit: Payer: Self-pay

## 2018-08-23 ENCOUNTER — Ambulatory Visit: Payer: BLUE CROSS/BLUE SHIELD | Admitting: Cardiovascular Disease

## 2018-08-23 ENCOUNTER — Encounter (INDEPENDENT_AMBULATORY_CARE_PROVIDER_SITE_OTHER): Payer: Self-pay

## 2018-08-23 ENCOUNTER — Encounter: Payer: Self-pay | Admitting: Cardiovascular Disease

## 2018-08-23 VITALS — BP 114/74 | HR 62 | Ht 72.0 in | Wt 185.2 lb

## 2018-08-23 DIAGNOSIS — I350 Nonrheumatic aortic (valve) stenosis: Secondary | ICD-10-CM

## 2018-08-23 DIAGNOSIS — I2510 Atherosclerotic heart disease of native coronary artery without angina pectoris: Secondary | ICD-10-CM | POA: Diagnosis not present

## 2018-08-23 DIAGNOSIS — I712 Thoracic aortic aneurysm, without rupture, unspecified: Secondary | ICD-10-CM

## 2018-08-23 DIAGNOSIS — Z20822 Contact with and (suspected) exposure to covid-19: Secondary | ICD-10-CM

## 2018-08-23 DIAGNOSIS — E785 Hyperlipidemia, unspecified: Secondary | ICD-10-CM

## 2018-08-23 DIAGNOSIS — I10 Essential (primary) hypertension: Secondary | ICD-10-CM

## 2018-08-23 NOTE — Patient Instructions (Addendum)
Medication Instructions:  Your physician recommends that you continue on your current medications as directed. Please refer to the Current Medication list given to you today.  If you need a refill on your cardiac medications before your next appointment, please call your pharmacy.   Lab work: None If you have labs (blood work) drawn today and your tests are completely normal, you will receive your results only by: Marland Kitchen MyChart Message (if you have MyChart) OR . A paper copy in the mail If you have any lab test that is abnormal or we need to change your treatment, we will call you to review the results.  Testing/Procedures: Your physician has requested that you have an echocardiogram in October. Echocardiography is a painless test that uses sound waves to create images of your heart. It provides your doctor with information about the size and shape of your heart and how well your heart's chambers and valves are working. This procedure takes approximately one hour. There are no restrictions for this procedure.  Your physician recommends that you have an MR Angiogram of your chest in October.   Follow-Up: At [facility], you and your health needs are our priority.  As part of our continuing mission to provide you with exceptional heart care, we have created designated Provider Care Teams.  These Care Teams include your primary Cardiologist (physician) and Advanced Practice Providers (APPs -  Physician Assistants and Nurse Practitioners) who all work together to provide you with the care you need, when you need it. You will need a follow up appointment in 12 months.  Please call our office 2 months in advance to schedule this appointment.  You may see Lauree Chandler, MD or one of the following Advanced Practice Providers on your designated Care Team:   [place], PA-C Melina Copa, PA-C . Ermalinda Barrios, PA-C  Any Other Special Instructions Will Be Listed Below (If Applicable).

## 2018-08-25 LAB — NOVEL CORONAVIRUS, NAA: SARS-CoV-2, NAA: NOT DETECTED

## 2018-09-13 ENCOUNTER — Other Ambulatory Visit: Payer: Self-pay | Admitting: Cardiovascular Disease

## 2018-09-13 DIAGNOSIS — E785 Hyperlipidemia, unspecified: Secondary | ICD-10-CM

## 2018-09-13 DIAGNOSIS — I10 Essential (primary) hypertension: Secondary | ICD-10-CM

## 2018-09-13 DIAGNOSIS — Q231 Congenital insufficiency of aortic valve: Secondary | ICD-10-CM

## 2018-09-13 DIAGNOSIS — I712 Thoracic aortic aneurysm, without rupture, unspecified: Secondary | ICD-10-CM

## 2018-09-13 DIAGNOSIS — I2510 Atherosclerotic heart disease of native coronary artery without angina pectoris: Secondary | ICD-10-CM

## 2018-09-13 DIAGNOSIS — Z952 Presence of prosthetic heart valve: Secondary | ICD-10-CM

## 2018-09-13 MED ORDER — ATORVASTATIN CALCIUM 80 MG PO TABS: ORAL_TABLET | ORAL | 3 refills | Status: DC

## 2018-10-08 ENCOUNTER — Other Ambulatory Visit: Payer: Self-pay | Admitting: Cardiovascular Disease

## 2018-10-08 MED ORDER — CARVEDILOL 25 MG PO TABS: ORAL_TABLET | ORAL | 3 refills | Status: DC

## 2018-10-09 ENCOUNTER — Other Ambulatory Visit: Payer: Self-pay

## 2018-10-09 DIAGNOSIS — Z6825 Body mass index (BMI) 25.0-25.9, adult: Secondary | ICD-10-CM | POA: Diagnosis not present

## 2018-10-09 DIAGNOSIS — R6889 Other general symptoms and signs: Secondary | ICD-10-CM | POA: Diagnosis not present

## 2018-10-09 DIAGNOSIS — Z20822 Contact with and (suspected) exposure to covid-19: Secondary | ICD-10-CM

## 2018-10-09 DIAGNOSIS — Z20828 Contact with and (suspected) exposure to other viral communicable diseases: Secondary | ICD-10-CM | POA: Diagnosis not present

## 2018-10-11 LAB — NOVEL CORONAVIRUS, NAA: SARS-CoV-2, NAA: NOT DETECTED

## 2018-10-12 ENCOUNTER — Other Ambulatory Visit: Payer: Self-pay

## 2018-10-12 DIAGNOSIS — Z20822 Contact with and (suspected) exposure to covid-19: Secondary | ICD-10-CM

## 2018-10-12 DIAGNOSIS — R6889 Other general symptoms and signs: Secondary | ICD-10-CM | POA: Diagnosis not present

## 2018-10-13 LAB — NOVEL CORONAVIRUS, NAA: SARS-CoV-2, NAA: NOT DETECTED

## 2018-10-23 ENCOUNTER — Other Ambulatory Visit: Payer: Self-pay

## 2018-10-23 ENCOUNTER — Ambulatory Visit (HOSPITAL_COMMUNITY): Payer: BLUE CROSS/BLUE SHIELD

## 2018-10-23 ENCOUNTER — Ambulatory Visit (HOSPITAL_BASED_OUTPATIENT_CLINIC_OR_DEPARTMENT_OTHER): Payer: BC Managed Care – PPO

## 2018-10-23 ENCOUNTER — Ambulatory Visit (HOSPITAL_COMMUNITY)
Admission: RE | Admit: 2018-10-23 | Discharge: 2018-10-23 | Disposition: A | Discharge status: Home / Self Care | Payer: BC Managed Care – PPO | Source: Ambulatory Visit | Attending: Cardiovascular Disease | Admitting: Cardiovascular Disease

## 2018-10-23 ENCOUNTER — Other Ambulatory Visit (HOSPITAL_COMMUNITY): Payer: BLUE CROSS/BLUE SHIELD

## 2018-10-23 DIAGNOSIS — I712 Thoracic aortic aneurysm, without rupture, unspecified: Secondary | ICD-10-CM

## 2018-10-23 DIAGNOSIS — I350 Nonrheumatic aortic (valve) stenosis: Secondary | ICD-10-CM

## 2018-10-23 LAB — CREATININE, SERUM
Creatinine, Ser: 1.15 mg/dL (ref 0.61–1.24)
GFR calc Af Amer: >60 mL/min (ref >60)
GFR calc non Af Amer: >60 mL/min (ref >60)

## 2018-10-23 MED ORDER — GADOBUTROL 1 MMOL/ML IV SOLN
10.0000 mL | Freq: Once | INTRAVENOUS | Status: AC | PRN
  Administered 2018-10-23: 10 mL via INTRAVENOUS

## 2018-10-24 ENCOUNTER — Telehealth: Payer: Self-pay | Admitting: *Deleted

## 2018-10-24 DIAGNOSIS — I350 Nonrheumatic aortic (valve) stenosis: Secondary | ICD-10-CM

## 2018-10-24 NOTE — Telephone Encounter (Signed)
-----   Message from [name], MD sent at 10/24/2018  1:22 PM EDT ----- Can we let him know that his heart is strong. His aortic valve replacement has shown evidence of moderate stenosis over the past few years. No big change. We will repeat an echo in 6 months to assess this. Can we let him know? Also, see his MRA report. Thanks, Jonathan Jones

## 2018-10-24 NOTE — Telephone Encounter (Signed)
Pt returned call for results.  Provided echo and MRA chest results.  Pt verbalizes understanding.  Asked me to schedule echo and send through my chart the appointment details since he is driving.  Reminder sent to schedule chest MRA for next Oct.

## 2018-11-01 DIAGNOSIS — Z23 Encounter for immunization: Secondary | ICD-10-CM | POA: Diagnosis not present

## 2019-01-07 ENCOUNTER — Other Ambulatory Visit: Payer: Self-pay | Admitting: *Deleted

## 2019-01-07 MED ORDER — FENOFIBRATE 145 MG PO TABS: 145.0000 mg | ORAL_TABLET | Freq: Every day | ORAL | 2 refills | Status: DC

## 2019-02-18 DIAGNOSIS — Z125 Encounter for screening for malignant neoplasm of prostate: Secondary | ICD-10-CM | POA: Diagnosis not present

## 2019-02-18 DIAGNOSIS — Z0000 Encounter for general adult medical examination without abnormal findings: Secondary | ICD-10-CM | POA: Diagnosis not present

## 2019-02-18 DIAGNOSIS — E7849 Other hyperlipidemia: Secondary | ICD-10-CM | POA: Diagnosis not present

## 2019-02-18 DIAGNOSIS — I10 Essential (primary) hypertension: Secondary | ICD-10-CM | POA: Diagnosis not present

## 2019-02-19 DIAGNOSIS — R82998 Other abnormal findings in urine: Secondary | ICD-10-CM | POA: Diagnosis not present

## 2019-02-19 DIAGNOSIS — I10 Essential (primary) hypertension: Secondary | ICD-10-CM | POA: Diagnosis not present

## 2019-02-21 DIAGNOSIS — Z1212 Encounter for screening for malignant neoplasm of rectum: Secondary | ICD-10-CM | POA: Diagnosis not present

## 2019-02-25 DIAGNOSIS — F419 Anxiety disorder, unspecified: Secondary | ICD-10-CM | POA: Diagnosis not present

## 2019-02-25 DIAGNOSIS — Z1331 Encounter for screening for depression: Secondary | ICD-10-CM | POA: Diagnosis not present

## 2019-02-25 DIAGNOSIS — I10 Essential (primary) hypertension: Secondary | ICD-10-CM | POA: Diagnosis not present

## 2019-02-25 DIAGNOSIS — Z0000 Encounter for general adult medical examination without abnormal findings: Secondary | ICD-10-CM | POA: Diagnosis not present

## 2019-02-25 DIAGNOSIS — G43909 Migraine, unspecified, not intractable, without status migrainosus: Secondary | ICD-10-CM | POA: Diagnosis not present

## 2019-02-25 DIAGNOSIS — E785 Hyperlipidemia, unspecified: Secondary | ICD-10-CM | POA: Diagnosis not present

## 2019-03-08 DIAGNOSIS — Z03818 Encounter for observation for suspected exposure to other biological agents ruled out: Secondary | ICD-10-CM | POA: Diagnosis not present

## 2019-03-08 DIAGNOSIS — Z20828 Contact with and (suspected) exposure to other viral communicable diseases: Secondary | ICD-10-CM | POA: Diagnosis not present

## 2019-03-18 DIAGNOSIS — M1712 Unilateral primary osteoarthritis, left knee: Secondary | ICD-10-CM | POA: Diagnosis not present

## 2019-04-16 DIAGNOSIS — H524 Presbyopia: Secondary | ICD-10-CM | POA: Diagnosis not present

## 2019-04-16 DIAGNOSIS — H2513 Age-related nuclear cataract, bilateral: Secondary | ICD-10-CM | POA: Diagnosis not present

## 2019-04-19 DIAGNOSIS — Z20828 Contact with and (suspected) exposure to other viral communicable diseases: Secondary | ICD-10-CM | POA: Diagnosis not present

## 2019-04-19 DIAGNOSIS — Z03818 Encounter for observation for suspected exposure to other biological agents ruled out: Secondary | ICD-10-CM | POA: Diagnosis not present

## 2019-04-24 ENCOUNTER — Other Ambulatory Visit: Payer: Self-pay

## 2019-04-24 ENCOUNTER — Ambulatory Visit (HOSPITAL_COMMUNITY): Payer: BC Managed Care – PPO | Attending: Cardiology

## 2019-04-24 DIAGNOSIS — I350 Nonrheumatic aortic (valve) stenosis: Secondary | ICD-10-CM | POA: Diagnosis not present

## 2019-04-29 NOTE — Telephone Encounter (Signed)
Unable to reach patient by phone.  2 messages left for him.  Replied to his questions via portal.

## 2019-05-13 DIAGNOSIS — S83242A Other tear of medial meniscus, current injury, left knee, initial encounter: Secondary | ICD-10-CM | POA: Diagnosis not present

## 2019-05-13 DIAGNOSIS — M25562 Pain in left knee: Secondary | ICD-10-CM | POA: Diagnosis not present

## 2019-05-13 DIAGNOSIS — M25462 Effusion, left knee: Secondary | ICD-10-CM | POA: Diagnosis not present

## 2019-05-29 DIAGNOSIS — M25562 Pain in left knee: Secondary | ICD-10-CM | POA: Diagnosis not present

## 2019-06-04 ENCOUNTER — Telehealth: Payer: Self-pay | Admitting: *Deleted

## 2019-06-04 NOTE — Telephone Encounter (Signed)
Call placed to pt re: surgical clearance request and the need for an appt before clearance can be given. Pt has been scheduled to see Jacolyn Reedy, PA-Z, 06/12/2019 at 1:45 with understanding to arrive at 1:30 for registration.  Pt was grateful for the call.   Will route to requesting surgeon's office to make them aware.

## 2019-06-04 NOTE — Telephone Encounter (Signed)
   Primary Cardiologist:Christopher Clifton James, MD (last seen 08/2018)  Chart reviewed as part of pre-operative protocol coverage. Because of Jonathan Jones past medical history and time since last visit, he/she will require a follow-up visit in order to better assess preoperative cardiovascular risk.  Pre-op covering staff: - Please schedule appointment and call patient to inform them. - Please contact requesting surgeon's office via preferred method (i.e, phone, fax) to inform them of need for appointment prior to surgery.  If applicable, this message will also be routed to pharmacy pool and/or primary cardiologist for input on holding anticoagulant/antiplatelet agent as requested below so that this information is available at time of patient's appointment.   [place], [place]  06/04/2019, 12:33 PM

## 2019-06-04 NOTE — Telephone Encounter (Signed)
   [facility] Pre-operative Risk Assessment    HEARTCARE STAFF: - Please ensure there is not already an duplicate clearance open for this procedure - Under Visit Info/Reason for Call, type in Other and utilize the format Clearance MM/DD/YY or Clearance TBD  Request for surgical clearance:  1. What type of surgery is being performed?  LEFT KNEE SCOPE   2. When is this surgery scheduled?  TBD   3. What type of clearance is required (medical clearance vs. Pharmacy clearance to hold med vs. Both)?  MEDICAL  4. Are there any medications that need to be held prior to surgery and how long? N/A   5. Practice name and name of physician performing surgery?  [facility] / DR. Noemi Chapel   6. What is the office phone number?  [phone]   7.   What is the office fax number?  [phone]   8.   Anesthesia type (None, local, MAC, general) ?   (NEED TO FAX MOST RECENT NOTES, LABS, EKG'S OR ANY SPECIAL STUDIES WITH THE CLEARANCE)   Jeanann Lewandowsky 06/04/2019, 11:33 AM  _________________________________________________________________   (provider comments below)

## 2019-06-05 NOTE — Progress Notes (Signed)
Cardiology Office Note    Date:  06/12/2019   ID:  Jonathan Jones, DOB 01/17/1961, MRN [id number]  PCP:  Alysia Penna, MD  Cardiologist: Verne Carrow, MD EPS: None  Chief Complaint  Patient presents with  . Pre-op Exam    History of Present Illness:  Jonathan Jones is a 59 y.o. male with history of CAD s/p 3V CABG in 2009, bicuspid aortic valve status post bioprosthetic aortic valve replacement in 2009, HTN, hyperlipidemia and thoracic aortic aneurysm.  2D echo 10/2017 moderate aortic stenosis mean gradient 27 mmHg, normal LV systolic function, aortic root dilated 4.5 cm.  Thoracic MRA September 2018 with dilatation of the ascending order of 4.5 cm.   Patient last saw Dr. Clifton James 08/23/2018 at which time he was doing well without symptoms.  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  Ascending aorta 44 mmHg.  He recommended repeat in 6 months which was done 04/24/2019 and transaortic gradients were unchanged.  Ascending aortic dilatation increased from 44 to 46 mm.  Moderate to severe aortic stenosis.  MRA of the chest 10/2018 4.5 cm ascending thoracic aortic aneurysm stable since 2018.   Patient added onto my schedule for preoperative clearance before undergoing knee surgery for torn meniscus by Dr. Thurston Hole.  Patient denies chest pain, dyspnea, dizziness, palpitations or presycope. Tore his meniscus 3 weeks ago working in his garden. Walking 3 miles daily before that. Starts off slow when he starts and a little short of breath after first hill and then he can go on. Still working hard in his garden.    Past Medical History:  Diagnosis Date  . Aortic stenosis   . Arthritis    hands  . Coronary artery disease   . GERD (gastroesophageal reflux disease)   . Heart murmur   . Hypertension    Unspecified  . Migraine headache   . Mixed hyperlipidemia    . Thoracic aortic aneurysm (HCC)    4.4 cm ascending aorta (06/2016)    Past Surgical History:  Procedure Laterality Date  . AORTIC VALVE REPLACEMENT  01/25/2007   25 mm [facility] pericardial tissue valve  . COLONOSCOPY WITH PROPOFOL N/A 05/10/2016   Procedure: COLONOSCOPY WITH PROPOFOL;  Surgeon: Charolett Bumpers, MD;  Location: [facility];  Service: Endoscopy;  Laterality: N/A;  . colonscopy    . CORONARY ARTERY BYPASS GRAFT  01/2007   x3 (LIMA->LAD, SVG->D, SVG->PDA)  . WISDOM TOOTH EXTRACTION  1980    Current Medications: Current Meds  Medication Sig  . amoxicillin (AMOXIL) 500 MG capsule Take four (4) capsules by mouth as needed one (1) hour prior to dental procedures.  Marland Kitchen aspirin EC 81 MG tablet Take 1 tablet (81 mg total) by mouth daily.  Marland Kitchen atorvastatin (LIPITOR) 80 MG tablet TAKE 1 TABLET(80 MG) BY MOUTH DAILY  . calcium carbonate (TUMS - DOSED IN MG ELEMENTAL CALCIUM) 500 MG chewable tablet Chew 2-3 tablets by mouth daily as needed for indigestion or heartburn.  . carvedilol (COREG) 25 MG tablet TAKE 1 TABLET(25 MG) BY MOUTH TWICE DAILY WITH A MEAL  . doxepin (SINEQUAN) 75 MG capsule take 75mg s once daily at bedtime  . fenofibrate (TRICOR) 145 MG tablet Take 1 tablet (145 mg total) by mouth daily.  ibuprofen (ADVIL,MOTRIN) 200 MG tablet Take 400-800 mg by mouth daily as needed for moderate pain.  Marland Kitchen loratadine (CLARITIN)  10 MG tablet Take 10 mg by mouth daily as needed for allergies.  . meloxicam (MOBIC) 15 MG tablet Take 15mg s by mouth up to two times daily as needed for pain  . PRESCRIPTION MEDICATION Apply 1 application topically daily as needed (psoriasis). Steroid cream  . Tetrahydrozoline HCl (VISINE OP) Apply 1 drop to eye daily as needed (redness/dry eyes).     Allergies:   Patient has no known allergies.   Social History   Socioeconomic History  . Marital status: Married    Spouse name: Not on file  . Number of children: Not on file  . Years of  education: Not on file  . Highest education level: Not on file  Occupational History  . Occupation: Full time  Tobacco Use  . Smoking status: Never Smoker  . Smokeless tobacco: Never Used  . Tobacco comment: Tobacco use-no  Substance and Sexual Activity  . Alcohol use: Yes    Comment: Occasionally  . Drug use: Yes    Types: Marijuana    Comment: marijuana use a few weeks ago  . Sexual activity: Not on file  Other Topics Concern  . Not on file  Social History Narrative  . Not on file   Social Determinants of Health   Financial Resource Strain:   . Difficulty of Paying Living Expenses:   Food Insecurity:   . Worried About in the Last Year:   . Programme researcher, broadcasting/film/video in the Last Year:   Transportation Needs:   . Barista (Medical):   Freight forwarder Lack of Transportation (Non-Medical):   Physical Activity:   . Days of Exercise per Week:   . Minutes of Exercise per Session:   Stress:   . Feeling of Stress :   Social Connections:   . Frequency of Communication with Friends and Family:   . Frequency of Social Gatherings with Friends and Family:   . Attends Religious Services:   . Active Member of Clubs or Organizations:   . Attends Marland Kitchen Meetings:   Banker Marital Status:      Family History:  The patient's   family history includes Alzheimer's disease in his father; Cancer (age of onset: 56) in an other family member; Coronary artery disease in an other family member; Other (age of onset: 32) in his sister.   ROS:   Please see the history of present illness.    ROS All other systems reviewed and are negative.   PHYSICAL EXAM:   VS:  BP 122/80   Pulse 77   Ht 6' (1.829 m)   Wt 185 lb (83.9 kg)   SpO2 97%   BMI 25.09 kg/m   Physical Exam  GEN: Well nourished, well developed, in no acute distress  Neck: no JVD, carotid bruits, or masses Cardiac:RRR; 3/6 harsh systolic murmur at the left sternal border and took his carotids Respiratory:   clear to auscultation bilaterally, normal work of breathing GI: soft, nontender, nondistended, + BS Ext: without cyanosis, clubbing, or edema, Good distal pulses bilaterally Neuro:  Alert and Oriented x 3 Psych: euthymic mood, full affect  Wt Readings from Last 3 Encounters:  06/12/19 185 lb (83.9 kg)  08/23/18 185 lb 3.2 oz (84 kg)  01/22/18 194 lb 9.6 oz (88.3 kg)      Studies/Labs Reviewed:   EKG:  EKG is ordered today.  The ekg ordered today demonstrates NSR with first degree AV block and TWI inflat more pronounced since  2019 but similar to 2018  Recent Labs: 10/23/2018: Creatinine, Ser 1.15   Lipid Panel    Component Value Date/Time   CHOL 114 03/14/2017 0811   TRIG 151 (H) 03/14/2017 0811   HDL 36 (L) 03/14/2017 0811   CHOLHDL 3.2 03/14/2017 0811   CHOLHDL 4.7 09/24/2015 0806   VLDL 71 (H) 09/24/2015 0806   LDLCALC 48 03/14/2017 0811   LDLDIRECT 52.0 06/17/2014 1505    Additional studies/ records that were reviewed today include:  2D echo 4/2021IMPRESSIONS     1. When compared to te prior study from 10/23/2018 transortic gradients  are unchanched. Ascendung aaortic dilatation increased from 44 to 46 mm.  Correlation with a CTA or MRA is recommended.   2. Left ventricular ejection fraction, by estimation, is 60 to 65%. The  left ventricle has normal function. The left ventricle has no regional  wall motion abnormalities. There is mild concentric left ventricular  hypertrophy. Left ventricular diastolic  parameters are consistent with Grade II diastolic dysfunction  (pseudonormalization). Elevated left atrial pressure. The average left  ventricular global longitudinal strain is -14.1 %.   3. Right ventricular systolic function is normal. The right ventricular  size is normal. There is normal pulmonary artery systolic pressure.   4. Left atrial size was mildly dilated.   5. The mitral valve is normal in structure. No evidence of mitral valve  regurgitation. No  evidence of mitral stenosis.   6. The aortic valve has been repaired/replaced. Aortic valve  regurgitation is not visualized. Moderate to severe aortic valve stenosis.  There is a 25 mm Magna valve present in the aortic position. Procedure  Date: 2009. Aortic valve mean gradient  measures 30.0 mmHg.   7. Aortic dilatation noted. Aneurysm of the ascending aorta, measuring 46  mm.   8. The inferior vena cava is normal in size with greater than 50%  respiratory variability, suggesting right atrial pressure of 3 mmHg.   Comparison(s): 10/23/18 EF 60-65%. Severe AS 33mmHg mean, 57mmHg peak.  Ascending aortic dimension 44mm.   2D echo 10/2018 IMPRESSIONS     1. Left ventricular ejection fraction, by visual estimation, is 60 to  65%. The left ventricle has normal function. Normal left ventricular size.  There is mildly increased left ventricular hypertrophy. Basal inferior  hypokinesis.   2. Left ventricular diastolic Doppler parameters are consistent with  pseudonormalization pattern of LV diastolic filling.   3. There was a bioprosthetic aortic valve. The bioprosthetic valve  appears heavily calcified. No aortic insufficiency noted. Mean gradient 33  mmHg with calculated AVA 0.83 cm^2, dimensionless index 0.18. I suspect  that there is severe bioprosthetic  aortic stenosis present.   4. There is moderate dilatation of the ascending aorta measuring 44 mm.   5. The tricuspid valve is normal in structure. Tricuspid valve  regurgitation is trivial.   6. Right atrial size was normal.   7. Global right ventricle has normal systolic function.The right  ventricular size is normal. No increase in right ventricular wall  thickness.   8. Left atrial size was mildly dilated.   9. The mitral valve is normal in structure. No evidence of mitral valve  regurgitation. No evidence of mitral stenosis.  10. The inferior vena cava is normal in size with greater than 50%  respiratory variability,  suggesting right atrial pressure of 3 mmHg.  11. The tricuspid regurgitant velocity is 1.92 m/s, and with an assumed  right atrial pressure of 3 mmHg, the estimated right ventricular  systolic  pressure is normal at 17.7 mmHg.   FINDINGS   Left Ventricle: Left ventricular ejection fraction, by visual estimation,  is 60 to 65%. The left ventricle has normal function. There is mildly  increased left ventricular hypertrophy. Normal left ventricular size.  Spectral Doppler shows Left ventricular   diastolic Doppler parameters are consistent with pseudonormalization  pattern of LV diastolic filling.   Right Ventricle: The right ventricular size is normal. No increase in  right ventricular wall thickness. Global RV systolic function is has  normal systolic function. The tricuspid regurgitant velocity is 1.92 m/s,  and with an assumed right atrial pressure   of 3 mmHg, the estimated right ventricular systolic pressure is normal at  17.7 mmHg.   Left Atrium: Left atrial size was mildly dilated.   Right Atrium: Right atrial size was normal in size   Pericardium: There is no evidence of pericardial effusion.   Mitral Valve: The mitral valve is normal in structure. No evidence of  mitral valve stenosis by observation. No evidence of mitral valve  regurgitation.   Tricuspid Valve: The tricuspid valve is normal in structure. Tricuspid  valve regurgitation is trivial by color flow Doppler.   Aortic Valve: The aortic valve has been repaired/replaced. Aortic valve  regurgitation was not visualized by color flow Doppler. Aortic valve mean  gradient measures 34.0 mmHg. Aortic valve peak gradient measures 57.6  mmHg. Aortic valve area, by VTI  measures 0.85 cm. There was a bioprosthetic aortic valve. The  bioprosthetic valve appears heavily calcified. No aortic insufficiency  noted. Mean gradient 33 mmHg with calculated AVA 0.83 cm^2, dimensionless  index 0.18. I suspect that there is severe    bioprosthetic aortic stenosis present.   Pulmonic Valve: The pulmonic valve was normal in structure. Pulmonic valve  regurgitation is not visualized by color flow Doppler.   Aorta: Aortic dilatation noted. There is moderate dilatation of the  ascending aorta measuring 44 mm.   Venous: The inferior vena cava is normal in size with greater than 50%  respiratory variability, suggesting right atrial pressure of 3 mmHg.   IAS/Shunts: No atrial level shunt detected by color flow Doppler.     MRA of the chest 10/23/2018   IMPRESSION: Stable 4.5 cm fusiform aneurysm of the ascending thoracic aorta. No significant interval change compared to September of 2018. Recommend followup by abdomen and pelvis CTA in 6 months, and vascular surgery referral/consultation if not already obtained. This recommendation follows ACR consensus guidelines: White Paper of the ACR Incidental Findings Committee II on Vascular Findings. J Am Coll Radiol 2013; 10:789-794.   Signed,     ASSESSMENT:    1. Preop cardiovascular exam   2. Aortic valve stenosis, etiology of cardiac valve disease unspecified   3. Thoracic aortic aneurysm without rupture (HCC)   4. Coronary artery disease involving coronary bypass graft of native heart without angina pectoris   5. Essential hypertension   6. Hyperlipidemia, unspecified hyperlipidemia type      PLAN:  In order of problems listed above:  Preoperative clearance before undergoing knee arthroscopy by Dr. Thurston Hole Patient with  History of CAD, mod-severe Aortic stenosis and thoracic aneurysm that are followed closely with echos every 6 months. Echo 04/2019 normal LVEF and unchanged elevated gradients. Patient very active without symptoms and can proceed with arthroscopy meniscus surgery without further work up. According to the Revised Cardiac Risk Index (RCRI), his Perioperative Risk of Major Cardiac Event is (%): 0.9  His Functional Capacity in  METs is: 8.97  according to the Duke Activity Status Index (DASI).  Aortic stenosis status post bioprosthetic aortic valve replacement in 2009.  Most recent echo 04/2019 moderate to severe aortic stenosis mean gradient 30 mmHg-normal LVEF. Patient asymptomatic  Ascending thoracic aortic aneurysm 46 mm on echo 04/2019, 45 mm on MRA 10/2018 will give aneurysm guidlines  CAD status post CABG times 3 in 2009-very active without angina  Essential hypertension controlled  Hyperlipidemia on lipitor  Medication Adjustments/Labs and Tests Ordered: Current medicines are reviewed at length with the patient today.  Concerns regarding medicines are outlined above.  Medication changes, Labs and Tests ordered today are listed in the Patient Instructions below. Patient Instructions  Medication Instructions:  Your physician recommends that you continue on your current medications as directed. Please refer to the Current Medication list given to you today.  *If you need a refill on your cardiac medications before your next appointment, please call your pharmacy*   Lab Work: None ordered  If you have labs (blood work) drawn today and your tests are completely normal, you will receive your results only by: Marland Kitchen MyChart Message (if you have MyChart) OR . A paper copy in the mail If you have any lab test that is abnormal or we need to change your treatment, we will call you to review the results.   Testing/Procedures: None ordered   Follow-Up: Follow up with Dr. Clifton James on 10/04/19 at 3:40 PM   Other Instructions Information About Your Aneurysm  One of your tests has shown an aneurysm of your thoracic aorta. The word "aneurysm" refers to a bulge in an artery (blood vessel). Most people think of them in the context of an emergency, but yours was found incidentally. At this point there is nothing you need to do from a procedure standpoint, but there are some important things to keep in mind for day-to-day  life.  Mainstays of therapy for aneurysms include very good blood pressure control, healthy lifestyle, and avoiding tobacco products and street drugs. Research has raised concern that antibiotics in the fluoroquinolone class could be associated with increased risk of having an aneurysm develop or tear. This includes medicines that end in "floxacin," like Cipro or Levaquin. Make sure to discuss this information with other healthcare providers if you require antibiotics.  Since aneurysms can run in families, you should discuss your diagnosis with first degree relatives as they may need to be screened for this. Regular mild-moderate physical exercise is important, but avoid heavy lifting/weight lifting over 30lbs, chopping wood, shoveling snow or digging heavy earth with a shovel. It is best to avoid activities that cause grunting or straining (medically referred to as a "Valsalva maneuver"). This happens when a person bears down against a closed throat to increase the strength of arm or abdominal muscles. There's often a tendency to do this when lifting heavy weights, doing sit-ups, push-ups or chin-ups, etc., but it may be harmful.  This is a finding I would expect to be monitored periodically by your cardiology team. Most unruptured thoracic aortic aneurysms cause no symptoms, so they are often found during exams for other conditions. Contact a health care provider if you develop any discomfort in your upper back, neck, abdomen, trouble swallowing, cough or hoarseness, or unexplained weight loss. Get help right away if you develop severe pain in your upper back or abdomen that may move into your chest and arms, or any other concerning symptoms such as shortness of breath or fever.  Sumner Boast, PA-C  06/12/2019 2:41 PM    [facility] [place], [place],   [address] Phone: [phone]; Fax: [phone]

## 2019-06-12 ENCOUNTER — Ambulatory Visit: Payer: BC Managed Care – PPO | Admitting: Physician Assistant

## 2019-06-12 ENCOUNTER — Other Ambulatory Visit: Payer: Self-pay

## 2019-06-12 ENCOUNTER — Encounter: Payer: Self-pay | Admitting: Physician Assistant

## 2019-06-12 VITALS — BP 122/80 | HR 77 | Ht 72.0 in | Wt 185.0 lb

## 2019-06-12 DIAGNOSIS — I350 Nonrheumatic aortic (valve) stenosis: Secondary | ICD-10-CM | POA: Diagnosis not present

## 2019-06-12 DIAGNOSIS — D2261 Melanocytic nevi of right upper limb, including shoulder: Secondary | ICD-10-CM | POA: Diagnosis not present

## 2019-06-12 DIAGNOSIS — D225 Melanocytic nevi of trunk: Secondary | ICD-10-CM | POA: Diagnosis not present

## 2019-06-12 DIAGNOSIS — I25810 Atherosclerosis of coronary artery bypass graft(s) without angina pectoris: Secondary | ICD-10-CM | POA: Diagnosis not present

## 2019-06-12 DIAGNOSIS — I712 Thoracic aortic aneurysm, without rupture, unspecified: Secondary | ICD-10-CM

## 2019-06-12 DIAGNOSIS — Z01810 Encounter for preprocedural cardiovascular examination: Secondary | ICD-10-CM | POA: Diagnosis not present

## 2019-06-12 DIAGNOSIS — I10 Essential (primary) hypertension: Secondary | ICD-10-CM

## 2019-06-12 DIAGNOSIS — D2262 Melanocytic nevi of left upper limb, including shoulder: Secondary | ICD-10-CM | POA: Diagnosis not present

## 2019-06-12 DIAGNOSIS — D2272 Melanocytic nevi of left lower limb, including hip: Secondary | ICD-10-CM | POA: Diagnosis not present

## 2019-06-12 DIAGNOSIS — L570 Actinic keratosis: Secondary | ICD-10-CM | POA: Diagnosis not present

## 2019-06-12 DIAGNOSIS — E785 Hyperlipidemia, unspecified: Secondary | ICD-10-CM

## 2019-06-12 NOTE — Patient Instructions (Signed)
Medication Instructions:  Your physician recommends that you continue on your current medications as directed. Please refer to the Current Medication list given to you today.  *If you need a refill on your cardiac medications before your next appointment, please call your pharmacy*   Lab Work: None ordered  If you have labs (blood work) drawn today and your tests are completely normal, you will receive your results only by: Marland Kitchen MyChart Message (if you have MyChart) OR . A paper copy in the mail If you have any lab test that is abnormal or we need to change your treatment, we will call you to review the results.   Testing/Procedures: None ordered   Follow-Up: Follow up with Dr. Clifton James on 10/04/19 at 3:40 PM   Other Instructions Information About Your Aneurysm  One of your tests has shown an aneurysm of your thoracic aorta. The word "aneurysm" refers to a bulge in an artery (blood vessel). Most people think of them in the context of an emergency, but yours was found incidentally. At this point there is nothing you need to do from a procedure standpoint, but there are some important things to keep in mind for day-to-day life.  Mainstays of therapy for aneurysms include very good blood pressure control, healthy lifestyle, and avoiding tobacco products and street drugs. Research has raised concern that antibiotics in the fluoroquinolone class could be associated with increased risk of having an aneurysm develop or tear. This includes medicines that end in "floxacin," like Cipro or Levaquin. Make sure to discuss this information with other healthcare providers if you require antibiotics.  Since aneurysms can run in families, you should discuss your diagnosis with first degree relatives as they may need to be screened for this. Regular mild-moderate physical exercise is important, but avoid heavy lifting/weight lifting over 30lbs, chopping wood, shoveling snow or digging heavy earth with a  shovel. It is best to avoid activities that cause grunting or straining (medically referred to as a "Valsalva maneuver"). This happens when a person bears down against a closed throat to increase the strength of arm or abdominal muscles. There's often a tendency to do this when lifting heavy weights, doing sit-ups, push-ups or chin-ups, etc., but it may be harmful.  This is a finding I would expect to be monitored periodically by your cardiology team. Most unruptured thoracic aortic aneurysms cause no symptoms, so they are often found during exams for other conditions. Contact a health care provider if you develop any discomfort in your upper back, neck, abdomen, trouble swallowing, cough or hoarseness, or unexplained weight loss. Get help right away if you develop severe pain in your upper back or abdomen that may move into your chest and arms, or any other concerning symptoms such as shortness of breath or fever.

## 2019-06-18 ENCOUNTER — Encounter (HOSPITAL_BASED_OUTPATIENT_CLINIC_OR_DEPARTMENT_OTHER): Payer: Self-pay | Admitting: Orthopedic Surgery

## 2019-06-18 DIAGNOSIS — S83272A Complex tear of lateral meniscus, current injury, left knee, initial encounter: Secondary | ICD-10-CM | POA: Diagnosis present

## 2019-06-18 DIAGNOSIS — S83232A Complex tear of medial meniscus, current injury, left knee, initial encounter: Secondary | ICD-10-CM | POA: Diagnosis present

## 2019-06-18 NOTE — H&P (Signed)
Jonathan Jones is an 59 y.o. male.   Chief Complaint: right knee pain and recurrent swelling HPI: Jonathan Jones is a 59 year-old seen for recurrent left knee pain and swelling.  We saw him 7 weeks ago for this and injected the knee which helped temporarily, but now the pain has recurred.  Pain with weight bearing and activity, relieved by rest.  He has been doing a lot of work on his property with kneeling, squatting and lifting.  Intermittent sharp pain and swelling.    Past Medical History:  Diagnosis Date  . Aortic stenosis   . Arthritis    hands  . Coronary artery disease   . GERD (gastroesophageal reflux disease)   . Heart murmur   . Hypertension    Unspecified  . Migraine headache   . Mixed hyperlipidemia   . Thoracic aortic aneurysm (HCC)    4.4 cm ascending aorta (06/2016)    Past Surgical History:  Procedure Laterality Date  . AORTIC VALVE REPLACEMENT  01/25/2007   25 mm [facility] pericardial tissue valve  . COLONOSCOPY WITH PROPOFOL N/A 05/10/2016   Procedure: COLONOSCOPY WITH PROPOFOL;  Surgeon: Charolett Bumpers, MD;  Location: [facility];  Service: Endoscopy;  Laterality: N/A;  . colonscopy    . CORONARY ARTERY BYPASS GRAFT  01/2007   x3 (LIMA->LAD, SVG->D, SVG->PDA)  . WISDOM TOOTH EXTRACTION  1980    Family History  Problem Relation Age of Onset  . Alzheimer's disease Father   . Coronary artery disease Other   . Cancer Other 91  . Other Sister 38       brain tumor   Social History:  reports that he has never smoked. He has never used smokeless tobacco. He reports current alcohol use. He reports current drug use. Drug: Marijuana.  Allergies: No Known Allergies  No medications prior to admission.    No results found for this or any previous visit (from the past 48 hour(s)). No results found.  Review of Systems  Constitutional: Negative.   HENT: Negative.   Eyes: Negative.   Respiratory: Negative.   Cardiovascular: Negative.   Gastrointestinal:  Negative.   Endocrine: Negative.   Genitourinary: Negative.   Musculoskeletal: Positive for arthralgias, gait problem and joint swelling.  Allergic/Immunologic: Negative.   Hematological: Negative.   Psychiatric/Behavioral: Negative.     There were no vitals taken for this visit. Physical Exam  Constitutional: He is oriented to person, place, and time. He appears well-developed and well-nourished.  HENT:  Head: Normocephalic and atraumatic.  Eyes: Conjunctivae and EOM are normal.  Cardiovascular: Normal rate.  Murmur heard. Respiratory: Effort normal.  GI: Bowel sounds are normal.  Genitourinary:    Genitourinary Comments: Not pertinent to current symptomatology therefore not examined.   Musculoskeletal:     Cervical back: Neck supple.     Comments: Examination of his left knee reveals pain medially.  Positive medial McMurray.  2-3+ effusion.  Range of motion 0-120 degrees.  Knee is stable with normal patella tracking.  Examination of the right knee reveals full range of motion without pain, swelling, weakness or instability.    Neurological: He is alert and oriented to person, place, and time.  Skin: Skin is warm and dry.  Psychiatric: He has a normal mood and affect. His behavior is normal.     Assessment Principal Problem:   Complex tear of medial meniscus of left knee Active Problems:   Essential hypertension   Aortic valve disorder   CAD (coronary  artery disease) of artery bypass graft   Bicuspid aortic valve   S/P AVR   Thoracic aortic aneurysm without rupture (HCC)   Aortic valve stenosis   Complex tear of lateral meniscus of left knee as current injury   Plan I spoke to Arnell Sieving concerning his left knee MRI that has revealed both medial and lateral meniscal tears, as well as medial compartment chondromalacia and osteoarthritis.  All of his symptoms are mechanical in nature and he continues to have significant recurrent swelling.  He has some reactive subchondral  marrow edema in the medial compartment as well, but no stress fractures.  He has had his knee aspirated twice, but the pain and swelling recurs.  I have told him with these findings recommend that we proceed with left knee arthroscopy with partial meniscectomy and chondroplasty.  Risks, complications and benefits of the surgery have been described to him in detail and he understands this completely.    Kinsler Soeder J Omarie Parcell, PA-C 06/18/2019, 8:56 AM

## 2019-06-19 ENCOUNTER — Other Ambulatory Visit: Payer: Self-pay

## 2019-06-19 ENCOUNTER — Encounter (HOSPITAL_BASED_OUTPATIENT_CLINIC_OR_DEPARTMENT_OTHER): Payer: Self-pay | Admitting: Orthopedic Surgery

## 2019-06-19 MED ORDER — CHOLINE FENOFIBRATE 135 MG PO CPDR: 135.0000 mg | DELAYED_RELEASE_CAPSULE | Freq: Every day | ORAL | 3 refills | Status: DC

## 2019-06-20 ENCOUNTER — Other Ambulatory Visit (HOSPITAL_COMMUNITY): Payer: BC Managed Care – PPO

## 2019-06-21 ENCOUNTER — Other Ambulatory Visit (HOSPITAL_COMMUNITY)
Admission: RE | Admit: 2019-06-21 | Discharge: 2019-06-21 | Disposition: A | Discharge status: Home / Self Care | Payer: BC Managed Care – PPO | Source: Ambulatory Visit | Attending: Orthopedic Surgery | Admitting: Orthopedic Surgery

## 2019-06-21 ENCOUNTER — Encounter (HOSPITAL_BASED_OUTPATIENT_CLINIC_OR_DEPARTMENT_OTHER)
Admission: RE | Admit: 2019-06-21 | Discharge: 2019-06-21 | Disposition: A | Discharge status: Home / Self Care | Payer: BC Managed Care – PPO | Source: Ambulatory Visit | Attending: Orthopedic Surgery | Admitting: Orthopedic Surgery

## 2019-06-21 DIAGNOSIS — Z20822 Contact with and (suspected) exposure to covid-19: Secondary | ICD-10-CM | POA: Insufficient documentation

## 2019-06-21 DIAGNOSIS — Z01812 Encounter for preprocedural laboratory examination: Secondary | ICD-10-CM | POA: Diagnosis not present

## 2019-06-21 LAB — CBC WITH DIFFERENTIAL/PLATELET
Abs Immature Granulocytes: 0.12 10*3/uL — ABNORMAL HIGH (ref 0.00–0.07)
Basophils Absolute: 0.1 10*3/uL (ref 0.0–0.1)
Basophils Relative: 1 %
Eosinophils Absolute: 0.3 10*3/uL (ref 0.0–0.5)
Eosinophils Relative: 3 %
HCT: 45.9 % (ref 39.0–52.0)
Hemoglobin: 14.9 g/dL (ref 13.0–17.0)
Immature Granulocytes: 1 %
Lymphocytes Relative: 18 %
Lymphs Abs: 1.5 10*3/uL (ref 0.7–4.0)
MCH: 30 pg (ref 26.0–34.0)
MCHC: 32.5 g/dL (ref 30.0–36.0)
MCV: 92.5 fL (ref 80.0–100.0)
Monocytes Absolute: 0.7 10*3/uL (ref 0.1–1.0)
Monocytes Relative: 9 %
Neutro Abs: 5.7 10*3/uL (ref 1.7–7.7)
Neutrophils Relative %: 68 %
Platelets: 244 10*3/uL (ref 150–400)
RBC: 4.96 MIL/uL (ref 4.22–5.81)
RDW: 12.4 % (ref 11.5–15.5)
WBC: 8.4 10*3/uL (ref 4.0–10.5)
nRBC: 0 % (ref 0.0–0.2)

## 2019-06-21 LAB — COMPREHENSIVE METABOLIC PANEL
ALT: 27 U/L (ref 0–44)
AST: 31 U/L (ref 15–41)
Albumin: 3.9 g/dL (ref 3.5–5.0)
Alkaline Phosphatase: 58 U/L (ref 38–126)
Anion gap: 11 (ref 5–15)
BUN: 17 mg/dL (ref 8–23)
CO2: 28 mmol/L (ref 22–32)
Calcium: 9.6 mg/dL (ref 8.9–10.3)
Chloride: 100 mmol/L (ref 98–111)
Creatinine, Ser: 1.07 mg/dL (ref 0.61–1.24)
GFR calc Af Amer: >60 mL/min (ref >60)
GFR calc non Af Amer: >60 mL/min (ref >60)
Glucose, Bld: 81 mg/dL (ref 70–99)
Potassium: 5.1 mmol/L (ref 3.5–5.1)
Sodium: 139 mmol/L (ref 135–145)
Total Bilirubin: 0.9 mg/dL (ref 0.3–1.2)
Total Protein: 6.4 g/dL — ABNORMAL LOW (ref 6.5–8.1)

## 2019-06-21 LAB — PROTIME-INR
INR: 1 (ref 0.8–1.2)
Prothrombin Time: 13 seconds (ref 11.4–15.2)

## 2019-06-21 LAB — SARS CORONAVIRUS 2 (TAT 6-24 HRS): SARS Coronavirus 2: NEGATIVE

## 2019-06-21 LAB — APTT: aPTT: 33 seconds (ref 24–36)

## 2019-06-21 NOTE — Progress Notes (Signed)

## 2019-06-25 ENCOUNTER — Ambulatory Visit (HOSPITAL_BASED_OUTPATIENT_CLINIC_OR_DEPARTMENT_OTHER)
Admission: RE | Admit: 2019-06-25 | Discharge: 2019-06-25 | Disposition: A | Discharge status: Home / Self Care | Payer: BC Managed Care – PPO | Attending: Orthopedic Surgery | Admitting: Orthopedic Surgery

## 2019-06-25 ENCOUNTER — Ambulatory Visit (HOSPITAL_BASED_OUTPATIENT_CLINIC_OR_DEPARTMENT_OTHER): Payer: BC Managed Care – PPO | Admitting: Certified Registered"

## 2019-06-25 ENCOUNTER — Other Ambulatory Visit: Payer: Self-pay

## 2019-06-25 ENCOUNTER — Encounter (HOSPITAL_BASED_OUTPATIENT_CLINIC_OR_DEPARTMENT_OTHER): Payer: Self-pay | Admitting: Orthopedic Surgery

## 2019-06-25 ENCOUNTER — Encounter (HOSPITAL_BASED_OUTPATIENT_CLINIC_OR_DEPARTMENT_OTHER)
Admission: RE | Disposition: A | Discharge status: Home / Self Care | Payer: Self-pay | Source: Home / Self Care | Attending: Orthopedic Surgery

## 2019-06-25 DIAGNOSIS — G43909 Migraine, unspecified, not intractable, without status migrainosus: Secondary | ICD-10-CM | POA: Diagnosis not present

## 2019-06-25 DIAGNOSIS — E782 Mixed hyperlipidemia: Secondary | ICD-10-CM | POA: Insufficient documentation

## 2019-06-25 DIAGNOSIS — S83282A Other tear of lateral meniscus, current injury, left knee, initial encounter: Secondary | ICD-10-CM | POA: Insufficient documentation

## 2019-06-25 DIAGNOSIS — M1712 Unilateral primary osteoarthritis, left knee: Secondary | ICD-10-CM | POA: Diagnosis not present

## 2019-06-25 DIAGNOSIS — I712 Thoracic aortic aneurysm, without rupture, unspecified: Secondary | ICD-10-CM | POA: Diagnosis present

## 2019-06-25 DIAGNOSIS — M94262 Chondromalacia, left knee: Secondary | ICD-10-CM | POA: Diagnosis not present

## 2019-06-25 DIAGNOSIS — I2510 Atherosclerotic heart disease of native coronary artery without angina pectoris: Secondary | ICD-10-CM | POA: Diagnosis not present

## 2019-06-25 DIAGNOSIS — Z8249 Family history of ischemic heart disease and other diseases of the circulatory system: Secondary | ICD-10-CM | POA: Diagnosis not present

## 2019-06-25 DIAGNOSIS — I350 Nonrheumatic aortic (valve) stenosis: Secondary | ICD-10-CM | POA: Diagnosis not present

## 2019-06-25 DIAGNOSIS — Z951 Presence of aortocoronary bypass graft: Secondary | ICD-10-CM | POA: Diagnosis not present

## 2019-06-25 DIAGNOSIS — Q231 Congenital insufficiency of aortic valve: Secondary | ICD-10-CM

## 2019-06-25 DIAGNOSIS — X58XXXA Exposure to other specified factors, initial encounter: Secondary | ICD-10-CM | POA: Diagnosis not present

## 2019-06-25 DIAGNOSIS — K219 Gastro-esophageal reflux disease without esophagitis: Secondary | ICD-10-CM | POA: Insufficient documentation

## 2019-06-25 DIAGNOSIS — I10 Essential (primary) hypertension: Secondary | ICD-10-CM | POA: Diagnosis present

## 2019-06-25 DIAGNOSIS — Z79899 Other long term (current) drug therapy: Secondary | ICD-10-CM | POA: Diagnosis not present

## 2019-06-25 DIAGNOSIS — Z7982 Long term (current) use of aspirin: Secondary | ICD-10-CM | POA: Insufficient documentation

## 2019-06-25 DIAGNOSIS — S83242A Other tear of medial meniscus, current injury, left knee, initial encounter: Secondary | ICD-10-CM | POA: Diagnosis not present

## 2019-06-25 DIAGNOSIS — Z952 Presence of prosthetic heart valve: Secondary | ICD-10-CM

## 2019-06-25 DIAGNOSIS — I25810 Atherosclerosis of coronary artery bypass graft(s) without angina pectoris: Secondary | ICD-10-CM | POA: Diagnosis present

## 2019-06-25 DIAGNOSIS — S83272A Complex tear of lateral meniscus, current injury, left knee, initial encounter: Secondary | ICD-10-CM | POA: Diagnosis present

## 2019-06-25 DIAGNOSIS — S83232A Complex tear of medial meniscus, current injury, left knee, initial encounter: Secondary | ICD-10-CM

## 2019-06-25 DIAGNOSIS — I359 Nonrheumatic aortic valve disorder, unspecified: Secondary | ICD-10-CM | POA: Diagnosis present

## 2019-06-25 HISTORY — DX: Complex tear of medial meniscus, current injury, left knee, initial encounter: S83.232A

## 2019-06-25 HISTORY — PX: KNEE ARTHROSCOPY WITH MEDIAL MENISECTOMY: SHX5651

## 2019-06-25 HISTORY — DX: Complex tear of lateral meniscus, current injury, left knee, initial encounter: S83.272A

## 2019-06-25 SURGERY — ARTHROSCOPY, KNEE, WITH MEDIAL MENISCECTOMY
Anesthesia: General | Site: Knee | Laterality: Left

## 2019-06-25 MED ORDER — OXYCODONE HCL 5 MG/5ML PO SOLN: 5.0000 mg | Freq: Once | ORAL | Status: AC | PRN

## 2019-06-25 MED ORDER — SODIUM CHLORIDE 0.9 % IV SOLN: INTRAVENOUS | Status: DC

## 2019-06-25 MED ORDER — FENTANYL CITRATE (PF) 100 MCG/2ML IJ SOLN
INTRAMUSCULAR | Status: DC | PRN
  Administered 2019-06-25: 25 ug via INTRAVENOUS
  Administered 2019-06-25: 50 ug via INTRAVENOUS
  Administered 2019-06-25: 25 ug via INTRAVENOUS

## 2019-06-25 MED ORDER — BUPIVACAINE-EPINEPHRINE 0.25% -1:200000 IJ SOLN
INTRAMUSCULAR | Status: DC | PRN
  Administered 2019-06-25: 19 mL

## 2019-06-25 MED ORDER — LACTATED RINGERS IV SOLN: INTRAVENOUS | Status: DC

## 2019-06-25 MED ORDER — ONDANSETRON HCL 4 MG PO TABS: 4.0000 mg | ORAL_TABLET | Freq: Four times a day (QID) | ORAL | Status: DC | PRN

## 2019-06-25 MED ORDER — LIDOCAINE HCL (CARDIAC) PF 100 MG/5ML IV SOSY
PREFILLED_SYRINGE | INTRAVENOUS | Status: DC | PRN
  Administered 2019-06-25: 60 mg via INTRAVENOUS

## 2019-06-25 MED ORDER — MORPHINE SULFATE (PF) 4 MG/ML IV SOLN: 0.5000 mg | INTRAVENOUS | Status: DC | PRN

## 2019-06-25 MED ORDER — CEFAZOLIN SODIUM-DEXTROSE 2-4 GM/100ML-% IV SOLN
INTRAVENOUS | Status: AC
  Filled 2019-06-25: qty 100

## 2019-06-25 MED ORDER — LIDOCAINE 2% (20 MG/ML) 5 ML SYRINGE
INTRAMUSCULAR | Status: AC
  Filled 2019-06-25: qty 5

## 2019-06-25 MED ORDER — HYDRALAZINE HCL 20 MG/ML IJ SOLN
INTRAMUSCULAR | Status: AC
  Filled 2019-06-25: qty 1

## 2019-06-25 MED ORDER — PROMETHAZINE HCL 25 MG/ML IJ SOLN: 6.2500 mg | INTRAMUSCULAR | Status: DC | PRN

## 2019-06-25 MED ORDER — POVIDONE-IODINE 10 % EX SWAB: 2.0000 "application " | Freq: Once | CUTANEOUS | Status: DC

## 2019-06-25 MED ORDER — MIDAZOLAM HCL 5 MG/5ML IJ SOLN
INTRAMUSCULAR | Status: DC | PRN
  Administered 2019-06-25: 2 mg via INTRAVENOUS

## 2019-06-25 MED ORDER — OXYCODONE HCL 5 MG PO TABS
5.0000 mg | ORAL_TABLET | Freq: Once | ORAL | Status: AC | PRN
  Administered 2019-06-25: 5 mg via ORAL

## 2019-06-25 MED ORDER — ONDANSETRON HCL 4 MG/2ML IJ SOLN: 4.0000 mg | Freq: Four times a day (QID) | INTRAMUSCULAR | Status: DC | PRN

## 2019-06-25 MED ORDER — DOCUSATE SODIUM 100 MG PO CAPS: 100.0000 mg | ORAL_CAPSULE | Freq: Two times a day (BID) | ORAL | Status: DC

## 2019-06-25 MED ORDER — MIDAZOLAM HCL 2 MG/2ML IJ SOLN
INTRAMUSCULAR | Status: AC
  Filled 2019-06-25: qty 2

## 2019-06-25 MED ORDER — POVIDONE-IODINE 7.5 % EX SOLN
Freq: Once | CUTANEOUS | Status: DC
  Filled 2019-06-25: qty 118

## 2019-06-25 MED ORDER — OXYCODONE HCL 5 MG PO TABS
ORAL_TABLET | ORAL | Status: AC
  Filled 2019-06-25: qty 1

## 2019-06-25 MED ORDER — HYDROCODONE-ACETAMINOPHEN 5-325 MG PO TABS: 1.0000 | ORAL_TABLET | ORAL | Status: DC | PRN

## 2019-06-25 MED ORDER — DEXAMETHASONE SODIUM PHOSPHATE 10 MG/ML IJ SOLN: 8.0000 mg | Freq: Once | INTRAMUSCULAR | Status: DC

## 2019-06-25 MED ORDER — PROPOFOL 10 MG/ML IV BOLUS
INTRAVENOUS | Status: AC
  Filled 2019-06-25: qty 20

## 2019-06-25 MED ORDER — ACETAMINOPHEN 325 MG PO TABS: 325.0000 mg | ORAL_TABLET | Freq: Four times a day (QID) | ORAL | Status: DC | PRN

## 2019-06-25 MED ORDER — METOCLOPRAMIDE HCL 5 MG/ML IJ SOLN: 5.0000 mg | Freq: Three times a day (TID) | INTRAMUSCULAR | Status: DC | PRN

## 2019-06-25 MED ORDER — EPINEPHRINE PF 1 MG/ML IJ SOLN
INTRAMUSCULAR | Status: DC | PRN
  Administered 2019-06-25: 1 mg

## 2019-06-25 MED ORDER — HYDRALAZINE HCL 20 MG/ML IJ SOLN
10.0000 mg | Freq: Once | INTRAMUSCULAR | Status: AC
  Administered 2019-06-25: 5 mg via INTRAVENOUS

## 2019-06-25 MED ORDER — HYDROMORPHONE HCL 1 MG/ML IJ SOLN: 0.2500 mg | INTRAMUSCULAR | Status: DC | PRN

## 2019-06-25 MED ORDER — FENTANYL CITRATE (PF) 100 MCG/2ML IJ SOLN
INTRAMUSCULAR | Status: AC
  Filled 2019-06-25: qty 2

## 2019-06-25 MED ORDER — METOCLOPRAMIDE HCL 5 MG PO TABS: 5.0000 mg | ORAL_TABLET | Freq: Three times a day (TID) | ORAL | Status: DC | PRN

## 2019-06-25 MED ORDER — ONDANSETRON HCL 4 MG/2ML IJ SOLN
INTRAMUSCULAR | Status: DC | PRN
  Administered 2019-06-25: 4 mg via INTRAVENOUS

## 2019-06-25 MED ORDER — DEXAMETHASONE SODIUM PHOSPHATE 10 MG/ML IJ SOLN
INTRAMUSCULAR | Status: AC
  Filled 2019-06-25: qty 1

## 2019-06-25 MED ORDER — CEFAZOLIN SODIUM-DEXTROSE 2-4 GM/100ML-% IV SOLN
2.0000 g | INTRAVENOUS | Status: AC
  Administered 2019-06-25: 2 g via INTRAVENOUS

## 2019-06-25 MED ORDER — PROPOFOL 10 MG/ML IV BOLUS
INTRAVENOUS | Status: DC | PRN
  Administered 2019-06-25: 200 mg via INTRAVENOUS

## 2019-06-25 MED ORDER — DEXAMETHASONE SODIUM PHOSPHATE 4 MG/ML IJ SOLN
INTRAMUSCULAR | Status: DC | PRN
  Administered 2019-06-25: 4 mg via INTRAVENOUS

## 2019-06-25 MED ORDER — MEPERIDINE HCL 25 MG/ML IJ SOLN: 6.2500 mg | INTRAMUSCULAR | Status: DC | PRN

## 2019-06-25 MED ORDER — SODIUM CHLORIDE 0.9 % IR SOLN
Status: DC | PRN
  Administered 2019-06-25: 1500 mL

## 2019-06-25 SURGICAL SUPPLY — 44 items
BLADE EXCALIBUR 4.0X13 (MISCELLANEOUS) IMPLANT
BNDG COHESIVE 4X5 TAN STRL (GAUZE/BANDAGES/DRESSINGS) IMPLANT
BNDG ELASTIC 6X5.8 VLCR STR LF (GAUZE/BANDAGES/DRESSINGS) ×2 IMPLANT
COVER WAND RF STERILE (DRAPES) IMPLANT
DISSECTOR  3.8MM X 13CM (MISCELLANEOUS)
DISSECTOR 3.8MM X 13CM (MISCELLANEOUS) IMPLANT
DISSECTOR 4.0MM X 13CM (MISCELLANEOUS) ×1 IMPLANT
DRAPE ARTHROSCOPY W/POUCH 90 (DRAPES) ×2 IMPLANT
DURAPREP 26ML APPLICATOR (WOUND CARE) ×2 IMPLANT
EXCALIBUR 3.8MM X 13CM (MISCELLANEOUS) IMPLANT
GAUZE SPONGE 4X4 12PLY STRL (GAUZE/BANDAGES/DRESSINGS) ×2 IMPLANT
GAUZE XEROFORM 1X8 LF (GAUZE/BANDAGES/DRESSINGS) ×2 IMPLANT
GLOVE BIO SURGEON STRL SZ7 (GLOVE) ×2 IMPLANT
GLOVE BIO SURGEON STRL SZ7.5 (GLOVE) ×1 IMPLANT
GLOVE BIOGEL PI IND STRL 6.5 (GLOVE) IMPLANT
GLOVE BIOGEL PI IND STRL 7.0 (GLOVE) ×1 IMPLANT
GLOVE BIOGEL PI IND STRL 7.5 (GLOVE) ×1 IMPLANT
GLOVE BIOGEL PI INDICATOR 6.5 (GLOVE) ×1
GLOVE BIOGEL PI INDICATOR 7.0 (GLOVE) ×1
GLOVE BIOGEL PI INDICATOR 7.5 (GLOVE) ×3
GLOVE SS BIOGEL STRL SZ 7.5 (GLOVE) ×1 IMPLANT
GLOVE SUPERSENSE BIOGEL SZ 7.5 (GLOVE) ×1
GOWN STRL REUS W/ TWL LRG LVL3 (GOWN DISPOSABLE) ×2 IMPLANT
GOWN STRL REUS W/ TWL XL LVL3 (GOWN DISPOSABLE) ×1 IMPLANT
GOWN STRL REUS W/TWL LRG LVL3 (GOWN DISPOSABLE) ×4
GOWN STRL REUS W/TWL XL LVL3 (GOWN DISPOSABLE) ×2
KNEE WRAP E Z 3 GEL PACK (MISCELLANEOUS) ×2 IMPLANT
MANIFOLD NEPTUNE II (INSTRUMENTS) ×2 IMPLANT
NDL SAFETY ECLIPSE 18X1.5 (NEEDLE) ×2 IMPLANT
NEEDLE HYPO 18GX1.5 SHARP (NEEDLE) ×2
NEEDLE HYPO 22GX1.5 SAFETY (NEEDLE) ×2 IMPLANT
PACK DSU ARTHROSCOPY (CUSTOM PROCEDURE TRAY) ×2 IMPLANT
PACK ICE MAXI GEL EZY WRAP (MISCELLANEOUS) ×1 IMPLANT
PAD ALCOHOL SWAB (MISCELLANEOUS) IMPLANT
PENCIL SMOKE EVACUATOR (MISCELLANEOUS) ×2 IMPLANT
PORT APPOLLO RF 90DEGREE MULTI (SURGICAL WAND) ×1 IMPLANT
SET BASIN DAY SURGERY F.S. (CUSTOM PROCEDURE TRAY) ×2 IMPLANT
SUT ETHILON 4 0 PS 2 18 (SUTURE) ×2 IMPLANT
SUT PROLENE 3 0 PS 2 (SUTURE) IMPLANT
SYR 20ML LL LF (SYRINGE) ×1 IMPLANT
SYR 5ML LL (SYRINGE) ×2 IMPLANT
TOWEL GREEN STERILE FF (TOWEL DISPOSABLE) ×2 IMPLANT
TUBING ARTHROSCOPY IRRIG 16FT (MISCELLANEOUS) ×2 IMPLANT
WATER STERILE IRR 1000ML POUR (IV SOLUTION) ×2 IMPLANT

## 2019-06-25 NOTE — Op Note (Signed)
NAME: Jonathan Jones, Jonathan Jones MEDICAL RECORD [id number] ACCOUNT [id number] DATE OF BIRTH:Mar 25, 1960 FACILITY: [facility] LOCATION: [facility] PHYSICIAN:Raunel Dimartino Salley Slaughter, MD  OPERATIVE REPORT  DATE OF PROCEDURE:  06/25/2019  PREOPERATIVE DIAGNOSES:   1.  Left knee acute traumatic medial and lateral meniscal tears. 2.  Left knee chondromalacia/osteoarthritis.  POSTOPERATIVE DIAGNOSES: 1.  Left knee acute traumatic medial and lateral meniscal tears. 2.  Left knee chondromalacia/osteoarthritis.  PROCEDURES:   1.  Left knee exam under anesthesia. 2.  Arthroscopic partial medial and lateral meniscectomies. 3.  Left knee chondroplasty.  SURGEON:  Salvatore Marvel, MD   ANESTHESIA:  General.  OPERATIVE TIME:  30 minutes.  COMPLICATIONS:  None.  INDICATIONS:  The patient is a 59 year old gentleman who has had 3-4 months of increasing left knee pain with recurrent effusions.  Exam and MRI has revealed medial and lateral meniscal tears with chondromalacia.  He has failed conservative care and is  now to undergo arthroscopy.  DESCRIPTION OF PROCEDURE:  The patient was brought to the operating room on 06/08/202,1 placed on the operating table in supine position.  After being placed under general anesthesia, his left knee was examined.  He had full range of motion, knee was  stable ligamentous exam with normal patellar tracking.  The knee was sterilely injected with 0.25% Marcaine with epinephrine.  The left leg was prepped using sterile DuraPrep and draped using sterile technique.  Time-out procedure was called and the  correct left knee identified.  Initially through an anterolateral portal, the arthroscope with a pump attached was placed and through an anteromedial portal, an arthroscopic probe was placed.  On initial inspection of the medial compartment, he was found  to have 20% grade IV and 50% grade III chondromalacia, which was debrided.  Medial meniscus showed tearing of the posteromedial horn, of  which 50% was resected back to a stable rim.  Intercondylar notch was inspected.  Anterior and posterior cruciate  ligaments were normal.  Lateral compartment inspected.  He had 25% grade III chondromalacia, which was debrided.  Lateral meniscus tear 30% posterolateral horn, which was resected back to a stable rim.  Patellofemoral joint showed 50-60%,grade III  chondromalacia, which was debrided.  The patella tracked normally.  Medial and lateral gutters were free of pathology.  At this point, it was felt that all pathology had been satisfactorily addressed.  The instruments were removed.  Portals closed with  3-0 nylon suture.  Sterile dressings were applied.  The patient awakened and taken to recovery room in stable condition.    FOLLOWUP CARE:  The patient to be followed as an outpatient on Norco for pain.  I will see him back in the office in a week for sutures out and followup.  VN/NUANCE  D:06/25/2019 T:06/25/2019 JOB:[id number]

## 2019-06-25 NOTE — Anesthesia Postprocedure Evaluation (Signed)
Anesthesia Post Note  Patient: Jonathan Jones  Procedure(s) Performed: KNEE ARTHROSCOPY WITH MEDIAL AND LATERAL MENISECTOMY (Left Knee)     Patient location during evaluation: PACU Anesthesia Type: General Level of consciousness: awake and alert Pain management: pain level controlled Vital Signs Assessment: post-procedure vital signs reviewed and stable Respiratory status: spontaneous breathing, nonlabored ventilation, respiratory function stable and patient connected to nasal cannula oxygen Cardiovascular status: blood pressure returned to baseline and stable Postop Assessment: no apparent nausea or vomiting Anesthetic complications: no    Last Vitals:  Vitals:   06/25/19 1315 06/25/19 1330  BP: (!) 139/94 (!) 157/97  Pulse: 62   Resp: 10   Temp:    SpO2: 100%     Last Pain:  Vitals:   06/25/19 1330  TempSrc:   PainSc: 1                  Kennieth Rad

## 2019-06-25 NOTE — Transfer of Care (Signed)
Immediate Anesthesia Transfer of Care Note  Patient: Jonathan Jones  Procedure(s) Performed: KNEE ARTHROSCOPY WITH MEDIAL AND LATERAL MENISECTOMY (Left Knee)  Patient Location: PACU  Anesthesia Type:General  Level of Consciousness: awake, alert  and oriented  Airway & Oxygen Therapy: Patient Spontanous Breathing and Patient connected to face mask oxygen  Post-op Assessment: Report given to RN and Post -op Vital signs reviewed and stable  Post vital signs: Reviewed and stable  Last Vitals:  Vitals Value Taken Time  BP 114/77 06/25/19 1303  Temp    Pulse 63 06/25/19 1309  Resp 9 06/25/19 1309  SpO2 100 % 06/25/19 1309  Vitals shown include unvalidated device data.  Last Pain:  Vitals:   06/25/19 1040  TempSrc: Oral  PainSc: 0-No pain         Complications: No apparent anesthesia complications

## 2019-06-25 NOTE — Anesthesia Procedure Notes (Signed)
Procedure Name: LMA Insertion Date/Time: 06/25/2019 12:58 PM Performed by: Mayer Camel, CRNA Pre-anesthesia Checklist: Patient identified, Emergency Drugs available, Suction available and Patient being monitored Patient Re-evaluated:Patient Re-evaluated prior to induction Oxygen Delivery Method: Circle System Utilized Preoxygenation: Pre-oxygenation with 100% oxygen Induction Type: IV induction Ventilation: Mask ventilation without difficulty LMA: LMA inserted LMA Size: 5.0 Number of attempts: 1 Airway Equipment and Method: Bite block Placement Confirmation: positive ETCO2 Tube secured with: Tape Dental Injury: Teeth and Oropharynx as per pre-operative assessment

## 2019-06-25 NOTE — Anesthesia Preprocedure Evaluation (Signed)
Anesthesia Evaluation  Patient identified by MRN, date of birth, ID band Patient awake    Reviewed: Allergy & Precautions, NPO status , Patient's Chart, lab work & pertinent test results  Airway Mallampati: II  TM Distance: >3 FB Neck ROM: Full    Dental no notable dental hx.    Pulmonary neg pulmonary ROS,    Pulmonary exam normal breath sounds clear to auscultation       Cardiovascular hypertension, Pt. on medications + CAD, + CABG and + Peripheral Vascular Disease  + Valvular Problems/Murmurs AS  Rhythm:Regular Rate:Normal + Systolic murmurs    Neuro/Psych  Headaches, negative psych ROS   GI/Hepatic Neg liver ROS, GERD  ,  Endo/Other  negative endocrine ROS  Renal/GU negative Renal ROS  negative genitourinary   Musculoskeletal  (+) Arthritis , Osteoarthritis,    Abdominal   Peds negative pediatric ROS (+)  Hematology negative hematology ROS (+)   Anesthesia Other Findings   Reproductive/Obstetrics negative OB ROS                             Anesthesia Physical  Anesthesia Plan  ASA: III  Anesthesia Plan: General   Post-op Pain Management:    Induction: Intravenous  PONV Risk Score and Plan: 2 and Ondansetron, Midazolam and Treatment may vary due to age or medical condition  Airway Management Planned: LMA  Additional Equipment:   Intra-op Plan:   Post-operative Plan:   Informed Consent: I have reviewed the patients History and Physical, chart, labs and discussed the procedure including the risks, benefits and alternatives for the proposed anesthesia with the patient or authorized representative who has indicated his/her understanding and acceptance.     Dental advisory given  Plan Discussed with: CRNA and Surgeon  Anesthesia Plan Comments:         Anesthesia Quick Evaluation

## 2019-06-25 NOTE — Discharge Instructions (Signed)
Diet: As you were doing prior to hospitalization   Activity: Increase activity slowly as tolerated  No lifting or driving for 48 hours  Shower: May shower without a dressing on post op day #5, NO SOAKING in tub   Dressing: You may change your dressing on post op day #3.  Then change the dressing daily with sterile 4"x4"s gauze dressing  Or band aids.  Weight Bearing: weight bearing as tolerated.  To prevent constipation: you may use a stool softener such as -  Colace ( over the counter) 100 mg by mouth twice a day  Drink plenty of fluids ( prune juice may be helpful) and high fiber foods  Miralax ( over the counter) for constipation as needed.   Precautions: If you experience chest pain or shortness of breath - call 911 immediately For transfer to the hospital emergency department!!  If you develop a fever greater that 101 F, purulent drainage from wound, increased redness or drainage from wound, or calf pain -- Call the office   Follow- Up Appointment: Please call for an appointment to be seen in 1 week or as previously scheduled.  [place] - [phone]   Post Anesthesia Home Care Instructions  Activity: Get plenty of rest for the remainder of the day. A responsible individual must stay with you for 24 hours following the procedure.  For the next 24 hours, DO NOT: -Drive a car -Advertising copywriter -Drink alcoholic beverages -Take any medication unless instructed by your physician -Make any legal decisions or sign important papers.  Meals: Start with liquid foods such as gelatin or soup. Progress to regular foods as tolerated. Avoid greasy, spicy, heavy foods. If nausea and/or vomiting occur, drink only clear liquids until the nausea and/or vomiting subsides. Call your physician if vomiting continues.  Special Instructions/Symptoms: Your throat may feel dry or sore from the anesthesia or the breathing tube placed in your throat during surgery. If this causes discomfort,  gargle with warm salt water. The discomfort should disappear within 24 hours.  If you had a scopolamine patch placed behind your ear for the management of post- operative nausea and/or vomiting:  1. The medication in the patch is effective for 72 hours, after which it should be removed.  Wrap patch in a tissue and discard in the trash. Wash hands thoroughly with soap and water. 2. You may remove the patch earlier than 72 hours if you experience unpleasant side effects which may include dry mouth, dizziness or visual disturbances. 3. Avoid touching the patch. Wash your hands with soap and water after contact with the patch.

## 2019-06-25 NOTE — Interval H&P Note (Signed)
History and Physical Interval Note:  06/25/2019 12:08 PM  Jonathan Jones  has presented today for surgery, with the diagnosis of MEDIAL AND LATERAL MENISCUS TEAR LEFT KNEE.  The various methods of treatment have been discussed with the patient and family. After consideration of risks, benefits and other options for treatment, the patient has consented to  Procedure(s): KNEE ARTHROSCOPY WITH MEDIAL AND LATERAL MENISECTOMY (Left) as a surgical intervention.  The patient's history has been reviewed, patient examined, no change in status, stable for surgery.  I have reviewed the patient's chart and labs.  Questions were answered to the patient's satisfaction.     Nilda Simmer

## 2019-06-26 ENCOUNTER — Encounter: Payer: Self-pay | Admitting: *Deleted

## 2019-07-02 ENCOUNTER — Telehealth: Payer: Self-pay | Admitting: *Deleted

## 2019-07-02 DIAGNOSIS — S83242D Other tear of medial meniscus, current injury, left knee, subsequent encounter: Secondary | ICD-10-CM | POA: Diagnosis not present

## 2019-07-02 DIAGNOSIS — S83282D Other tear of lateral meniscus, current injury, left knee, subsequent encounter: Secondary | ICD-10-CM | POA: Diagnosis not present

## 2019-07-02 NOTE — Telephone Encounter (Signed)
Called and spoke with patient. His BP was checked at dental appointment yesterday and they could not get the top number to read. So he was checking it all afternoon.  Might have become anxious about the high readings he was getting.  Took all meds like normal yesterday.  Nothing out of the ordinary going on except knee surgery one week ago.   Then had chest pressure last evening.  It was non exertional and went away on its own.  Did not feel heart pounding.  No SOB.   BP today is back to normal 120s/60s.  No further chest pressure.  He has concerns wondering what caused symptoms. Has not experienced before.   I scheduled him with Norma Fredrickson, NP tomorrow evening.  Pt aware if chest pressure returns or BP elevated and accompanied by other symptoms he should go to ER for evaluation.

## 2019-07-02 NOTE — Progress Notes (Signed)
CARDIOLOGY OFFICE NOTE  Date:  07/03/2019    Jonathan Jones Date of Birth: Nov 26, 1960 Medical Record #[id number]  PCP:  Jonathan Penna, MD  Cardiologist:  Jonathan Jones    Chief Complaint  Patient presents with  . Hypertension    Work in visit for elevated BP - seen for [employer]    History of Present Illness: Jonathan Jones is a 59 y.o. male who presents today for a work in visit. Seen for Dr. Clifton Jones.   He has a history of CAD with prior CABG x 3 in 2009 per Jonathan Jones, bicuspid aortic valve status post bioprosthetic aortic valve replacement in 2009, HTN, HLD and thoracic aortic aneurysm.  Chronically abnormal EKG noted.   Last seen by Dr. Clifton Jones in August of 2020 and was doing well. Echo updated in October. 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  Ascending aorta 44 mmHg.  He recommended repeat in 6 months which was done 04/24/2019 and transaortic gradients were unchanged.  Ascending aortic dilatation increased from 44 to 46 mm.  Moderate to severe aortic stenosis.  MRA of the chest 10/2018 4.5 cm ascending thoracic aortic aneurysm stable since 2018.  Seen just a few weeks ago by Jonathan Reedy, PA for a pre op clearance for knee surgery.    The patient does not have symptoms concerning for COVID-19 infection (fever, chills, cough, or new shortness of breath).   Comes in today. Here alone. He was at the dentist on Monday - needed a filling replaced - BP was high - diastolic apparently around 110 - running high the remainder of the day but checked very frequently. They proceeded on with his procedure at the dentist. He had been using some Advil prior on Sunday and may have used some on Monday - he is not sure. BP now ok. He felt like he had some associated chest pressure while this was going on - "post dentist". This has not recurred. BP has since been fine -  he did use an extra half of Coreg. He has tended to only check BP if "felt bad". Doing his PT at home. Not short of breath. Diet is very good - he is very conscious of this. He does probably do some excessive lifting - with some Valsalva noted - cautioned against this.   Past Medical History:  Diagnosis Date  . Aortic stenosis   . Arthritis    hands  . Complex tear of lateral meniscus of left knee as current injury   . Complex tear of medial meniscus of left knee   . Coronary artery disease   . GERD (gastroesophageal reflux disease)   . Heart murmur   . Hypertension    Unspecified  . Migraine headache   . Mixed hyperlipidemia   . Thoracic aortic aneurysm (HCC)    4.4 cm ascending aorta (06/2016)    Past Surgical History:  Procedure Laterality Date  . AORTIC VALVE REPLACEMENT  01/25/2007   25 mm [facility] pericardial tissue valve  . COLONOSCOPY WITH PROPOFOL N/A 05/10/2016   Procedure: COLONOSCOPY WITH PROPOFOL;  Surgeon: Charolett Bumpers, MD;  Location: [facility];  Service: Endoscopy;  Laterality: N/A;  . colonscopy    . CORONARY ARTERY BYPASS GRAFT  01/2007   x3 (LIMA->LAD, SVG->D, SVG->PDA)  . KNEE ARTHROSCOPY WITH MEDIAL MENISECTOMY Left 06/25/2019   Procedure: KNEE ARTHROSCOPY WITH  MEDIAL AND LATERAL MENISECTOMY;  Surgeon: Elsie Saas, MD;  Location: [place];  Service: Orthopedics;  Laterality: Left;  . WISDOM TOOTH EXTRACTION  1980     Medications: Current Meds  Medication Sig  . amoxicillin (AMOXIL) 500 MG capsule Take four (4) capsules by mouth as needed one (1) hour prior to dental procedures.  Marland Kitchen aspirin EC 81 MG tablet Take 1 tablet (81 mg total) by mouth daily.  Marland Kitchen atorvastatin (LIPITOR) 80 MG tablet TAKE 1 TABLET(80 MG) BY MOUTH DAILY  . carvedilol (COREG) 25 MG tablet TAKE 1 TABLET(25 MG) BY MOUTH TWICE DAILY WITH A MEAL  . Choline Fenofibrate 135 MG capsule Take 1 capsule (135 mg total) by mouth daily.  Marland Kitchen doxepin (SINEQUAN) 75 MG capsule  take 75mg s once daily at bedtime  . HYDROcodone-acetaminophen (NORCO/VICODIN) 5-325 MG tablet Take 1 tablet by mouth every 4 (four) hours as needed.  Marland Kitchen ibuprofen (ADVIL,MOTRIN) 200 MG tablet Take 400-800 mg by mouth daily as needed for moderate pain.  . mometasone (ELOCON) 0.1 % cream APPLY TOPICALLY TO THE AFFECTED AREA TWICE DAILY AS NEEDED  . PRESCRIPTION MEDICATION Apply 1 application topically daily as needed (psoriasis). Steroid cream  . Tetrahydrozoline HCl (VISINE OP) Apply 1 drop to eye daily as needed (redness/dry eyes).     Allergies: No Known Allergies  Social History: The patient  reports that he has never smoked. He has never used smokeless tobacco. He reports current alcohol use. He reports current drug use. Drug: Marijuana.   Family History: The patient's family history includes Alzheimer's disease in his father; Cancer (age of onset: 15) in an other family member; Coronary artery disease in an other family member; Other (age of onset: 98) in his sister.   Review of Systems: Please see the history of present illness.   All other systems are reviewed and negative.   Physical Exam: VS:  BP 110/70   Pulse 71   Ht 6' (1.829 m)   Wt 181 lb 12.8 oz (82.5 kg)   SpO2 97%   BMI 24.66 kg/m  .  BMI Body mass index is 24.66 kg/m.  Wt Readings from Last 3 Encounters:  07/03/19 181 lb 12.8 oz (82.5 kg)  06/25/19 181 lb (82.1 kg)  06/12/19 185 lb (83.9 kg)    General: Pleasant. Well developed, well nourished and in no acute distress.   HEENT: Normal.  Neck: Supple, no JVD, carotid bruits, or masses noted.  Cardiac: Regular rate and rhythm. No murmurs, rubs, or gallops. No edema.  Respiratory:  Lungs are clear to auscultation bilaterally with normal work of breathing.  GI: Soft and nontender.  MS: No deformity or atrophy. Gait and ROM intact.  Skin: Warm and dry. Color is normal.  Neuro:  Strength and sensation are intact and no gross focal deficits noted.  Psych: Alert,  appropriate and with normal affect.   LABORATORY DATA:  EKG:  EKG is ordered today.  Personally reviewed by me. This demonstrates NSR with 1st degree AV block, sinus rhythm, HR 71 with diffuse inferolateral T wave inversion. Overall unchanged - has had chronic T wave changes of with various pronoucement noted.   Lab Results  Component Value Date   WBC 8.4 06/21/2019   HGB 14.9 06/21/2019   HCT 45.9 06/21/2019   PLT 244 06/21/2019   GLUCOSE 81 06/21/2019   CHOL 114 03/14/2017   TRIG 151 (H) 03/14/2017   HDL 36 (L) 03/14/2017   LDLDIRECT 52.0 06/17/2014   Winger  48 03/14/2017   ALT 27 06/21/2019   AST 31 06/21/2019   NA 139 06/21/2019   K 5.1 06/21/2019   CL 100 06/21/2019   CREATININE 1.07 06/21/2019   BUN 17 06/21/2019   CO2 28 06/21/2019   TSH 1.06 09/04/2008   INR 1.0 06/21/2019   HGBA1C  01/23/2007    5.3 (NOTE)   The ADA recommends the following therapeutic goals for glycemic   control related to Hgb A1C measurement:   Goal of Therapy:   < 7.0% Hgb A1C   Action Suggested:  > 8.0% Hgb A1C   Ref:  Diabetes Care, 22, Suppl. 1, 1999       BNP (last 3 results) No results for input(s): BNP in the last 8760 hours.  ProBNP (last 3 results) No results for input(s): PROBNP in the last 8760 hours.   Other Studies Reviewed Today:  ECHO IMPRESSIONS 04/2019  1. When compared to te prior study from 10/23/2018 transortic gradients  are unchanched. Ascendung aaortic dilatation increased from 44 to 46 mm.  Correlation with a CTA or MRA is recommended.  2. Left ventricular ejection fraction, by estimation, is 60 to 65%. The  left ventricle has normal function. The left ventricle has no regional  wall motion abnormalities. There is mild concentric left ventricular  hypertrophy. Left ventricular diastolic  parameters are consistent with Grade II diastolic dysfunction  (pseudonormalization). Elevated left atrial pressure. The average left  ventricular global longitudinal  strain is -14.1 %.  3. Right ventricular systolic function is normal. The right ventricular  size is normal. There is normal pulmonary artery systolic pressure.  4. Left atrial size was mildly dilated.  5. The mitral valve is normal in structure. No evidence of mitral valve  regurgitation. No evidence of mitral stenosis.  6. The aortic valve has been repaired/replaced. Aortic valve  regurgitation is not visualized. Moderate to severe aortic valve stenosis.  There is a 25 mm Magna valve present in the aortic position. Procedure  Date: 2009. Aortic valve mean gradient  measures 30.0 mmHg.  7. Aortic dilatation noted. Aneurysm of the ascending aorta, measuring 46  mm.  8. The inferior vena cava is normal in size with greater than 50%  respiratory variability, suggesting right atrial pressure of 3 mmHg.   Comparison(s): 10/23/18 EF 60-65%. Severe AS mean, peak.  Ascending aortic dimension 26mm.     MRA CHEST IMPRESSION 10/2018: Stable 4.5 cm fusiform aneurysm of the ascending thoracic aorta. No significant interval change compared to September of 2018. Recommend followup by abdomen and pelvis CTA in 6 months, and vascular surgery referral/consultation if not already obtained. This recommendation follows ACR consensus guidelines: White Paper of the ACR Incidental Findings Committee II on Vascular Findings. J Am Coll Radiol 2013; 10:789-794.  Signed,  Sterling Big, MD, RPVI   ASSESSMENT & PLAN   1. Transient elevation in BP - uncertain etiology - this was following recent knee surgery - may have had too much NSAID use - goal is 130/80 - he will monitor more closely at home. No changes with his medicines for now.   2. CAD with remote CABG - chronically abnormal EKG - did have some chest pressure associated with the significant elevation in BP - this has not recurred - would follow.   3. Prior AVR - has elevated gradients on last echo from April - will  need close follow up.    4. Recent knee surgery - doing PT at home.  5. HLD - on statin - labs from earlier this year noted - LDL is at goal.   6. HTN - see #1.   7. Thoracic aneurysm - has serial imaging - reminded of precautions - goal BP should be 130/80 - avoid Valsalva and avoid flouroquinolones. May benefit from referral back to [facility] for follow up - will defer to Dr. Clifton Jones.   Current medicines are reviewed with the patient today.  The patient does not have concerns regarding medicines other than what has been noted above.  The following changes have been made:  See above.  Labs/ tests ordered today include:    Orders Placed This Encounter  Procedures  . EKG 12-Lead     Disposition:   FU with Dr. Clifton Jones as planned in September.    Patient is agreeable to this plan and will call if any problems develop in the interim.   SignedNorma Fredrickson, NP  07/03/2019 9:46 AM  [facility] [address] [place], [place]  [address] Phone: [phone] Fax: [phone]

## 2019-07-03 ENCOUNTER — Other Ambulatory Visit: Payer: Self-pay

## 2019-07-03 ENCOUNTER — Encounter: Payer: Self-pay | Admitting: Nurse Practitioner

## 2019-07-03 ENCOUNTER — Ambulatory Visit (INDEPENDENT_AMBULATORY_CARE_PROVIDER_SITE_OTHER): Payer: BC Managed Care – PPO | Admitting: Nurse Practitioner

## 2019-07-03 ENCOUNTER — Institutional Professional Consult (permissible substitution): Payer: BC Managed Care – PPO | Admitting: Nurse Practitioner

## 2019-07-03 VITALS — BP 110/70 | HR 71 | Ht 72.0 in | Wt 181.8 lb

## 2019-07-03 DIAGNOSIS — R030 Elevated blood-pressure reading, without diagnosis of hypertension: Secondary | ICD-10-CM

## 2019-07-03 DIAGNOSIS — I350 Nonrheumatic aortic (valve) stenosis: Secondary | ICD-10-CM | POA: Diagnosis not present

## 2019-07-03 DIAGNOSIS — I712 Thoracic aortic aneurysm, without rupture, unspecified: Secondary | ICD-10-CM

## 2019-07-03 DIAGNOSIS — I25810 Atherosclerosis of coronary artery bypass graft(s) without angina pectoris: Secondary | ICD-10-CM

## 2019-07-03 DIAGNOSIS — Z952 Presence of prosthetic heart valve: Secondary | ICD-10-CM

## 2019-07-03 DIAGNOSIS — I10 Essential (primary) hypertension: Secondary | ICD-10-CM

## 2019-07-03 DIAGNOSIS — E785 Hyperlipidemia, unspecified: Secondary | ICD-10-CM

## 2019-07-03 NOTE — Patient Instructions (Addendum)
After Visit Summary:  We will be checking the following labs today - NONE   Medication Instructions:    Continue with your current medicines.    If you need a refill on your cardiac medications before your next appointment, please call your pharmacy.     Testing/Procedures To Be Arranged:  N/A  Follow-Up:   See Dr. Clifton James as planned in September.     At [facility], you and your health needs are our priority.  As part of our continuing mission to provide you with exceptional heart care, we have created designated Provider Care Teams.  These Care Teams include your primary Cardiologist (physician) and Advanced Practice Providers (APPs -  Physician Assistants and Nurse Practitioners) who all work together to provide you with the care you need, when you need it.  Special Instructions:  . Stay safe, wash your hands for at least 20 seconds and wear a mask when needed.  . It was good to talk with you today.  Marland Kitchen Keep a check on your BP as we discussed - goal is 130/80 or less consistently.    Call the [facility] office at [phone] if you have any questions, problems or concerns.

## 2019-07-04 DIAGNOSIS — S83242D Other tear of medial meniscus, current injury, left knee, subsequent encounter: Secondary | ICD-10-CM | POA: Diagnosis not present

## 2019-07-04 DIAGNOSIS — S83282D Other tear of lateral meniscus, current injury, left knee, subsequent encounter: Secondary | ICD-10-CM | POA: Diagnosis not present

## 2019-07-15 DIAGNOSIS — S83242D Other tear of medial meniscus, current injury, left knee, subsequent encounter: Secondary | ICD-10-CM | POA: Diagnosis not present

## 2019-07-15 DIAGNOSIS — S83282D Other tear of lateral meniscus, current injury, left knee, subsequent encounter: Secondary | ICD-10-CM | POA: Diagnosis not present

## 2019-08-19 DIAGNOSIS — R519 Headache, unspecified: Secondary | ICD-10-CM | POA: Diagnosis not present

## 2019-08-19 DIAGNOSIS — G43111 Migraine with aura, intractable, with status migrainosus: Secondary | ICD-10-CM | POA: Diagnosis not present

## 2019-08-28 ENCOUNTER — Other Ambulatory Visit: Payer: Self-pay | Admitting: *Deleted

## 2019-08-28 DIAGNOSIS — I712 Thoracic aortic aneurysm, without rupture, unspecified: Secondary | ICD-10-CM

## 2019-08-28 NOTE — Progress Notes (Signed)
Patient due for repeat MRA chest for ascending aortic aneurysm in October 2021.  Order placed today.

## 2019-09-18 ENCOUNTER — Other Ambulatory Visit: Payer: Self-pay

## 2019-09-18 DIAGNOSIS — I10 Essential (primary) hypertension: Secondary | ICD-10-CM

## 2019-09-18 DIAGNOSIS — Z952 Presence of prosthetic heart valve: Secondary | ICD-10-CM

## 2019-09-18 DIAGNOSIS — E785 Hyperlipidemia, unspecified: Secondary | ICD-10-CM

## 2019-09-18 DIAGNOSIS — Q231 Congenital insufficiency of aortic valve: Secondary | ICD-10-CM

## 2019-09-18 DIAGNOSIS — I712 Thoracic aortic aneurysm, without rupture, unspecified: Secondary | ICD-10-CM

## 2019-09-18 DIAGNOSIS — I2510 Atherosclerotic heart disease of native coronary artery without angina pectoris: Secondary | ICD-10-CM

## 2019-09-18 MED ORDER — ATORVASTATIN CALCIUM 80 MG PO TABS: ORAL_TABLET | ORAL | 2 refills | Status: DC

## 2019-09-19 DIAGNOSIS — Z20822 Contact with and (suspected) exposure to covid-19: Secondary | ICD-10-CM | POA: Diagnosis not present

## 2019-09-19 DIAGNOSIS — Z03818 Encounter for observation for suspected exposure to other biological agents ruled out: Secondary | ICD-10-CM | POA: Diagnosis not present

## 2019-10-04 ENCOUNTER — Ambulatory Visit: Payer: BC Managed Care – PPO | Admitting: Cardiovascular Disease

## 2019-10-10 ENCOUNTER — Other Ambulatory Visit: Payer: Self-pay | Admitting: Cardiovascular Disease

## 2019-10-10 ENCOUNTER — Other Ambulatory Visit: Payer: Self-pay | Admitting: Internal Medicine

## 2019-10-14 ENCOUNTER — Ambulatory Visit: Payer: BC Managed Care – PPO | Admitting: Cardiovascular Disease

## 2019-10-14 ENCOUNTER — Other Ambulatory Visit: Payer: Self-pay

## 2019-10-14 ENCOUNTER — Encounter: Payer: Self-pay | Admitting: Cardiovascular Disease

## 2019-10-14 VITALS — BP 148/80 | HR 75 | Ht 72.0 in | Wt 182.8 lb

## 2019-10-14 DIAGNOSIS — I712 Thoracic aortic aneurysm, without rupture, unspecified: Secondary | ICD-10-CM

## 2019-10-14 DIAGNOSIS — I350 Nonrheumatic aortic (valve) stenosis: Secondary | ICD-10-CM

## 2019-10-14 DIAGNOSIS — I10 Essential (primary) hypertension: Secondary | ICD-10-CM

## 2019-10-14 DIAGNOSIS — I25810 Atherosclerosis of coronary artery bypass graft(s) without angina pectoris: Secondary | ICD-10-CM | POA: Diagnosis not present

## 2019-10-14 DIAGNOSIS — E785 Hyperlipidemia, unspecified: Secondary | ICD-10-CM

## 2019-10-14 MED ORDER — CARVEDILOL 25 MG PO TABS: ORAL_TABLET | ORAL | 3 refills | Status: DC

## 2019-10-14 NOTE — Progress Notes (Signed)
Chief Complaint  Patient presents with  . Follow-up    CAD   History of Present Illness: 59 yo male with history of CAD s/p 3V CABG in 2009, bicuspid aortic valve status post bioprosthetic aortic valve replacement in 2009, HTN, hyperlipidemia and thoracic aortic aneurysm here today for cardiac follow up. He has been followed in our office by Dr. Saunders Revel. I met him for the first time in January 2020. Echo in April 2021 with moderate aortic stenosis (mean gradient 30 mmHg). LV systolic function was normal. Thoracic MRA September 2018 with dilation of the ascending aorta at 4.5 cm, stable by MRA October 2020.   He is here today for follow up. The patient denies any chest pain, dyspnea, palpitations, lower extremity edema, orthopnea, PND, dizziness, near syncope or syncope.   Primary Care Physician: Velna Hatchet, MD  Past Medical History:  Diagnosis Date  . Aortic stenosis   . Arthritis    hands  . Complex tear of lateral meniscus of left knee as current injury   . Complex tear of medial meniscus of left knee   . Coronary artery disease   . GERD (gastroesophageal reflux disease)   . Heart murmur   . Hypertension    Unspecified  . Migraine headache   . Mixed hyperlipidemia   . Thoracic aortic aneurysm (HCC)    4.4 cm ascending aorta (06/2016)    Past Surgical History:  Procedure Laterality Date  . AORTIC VALVE REPLACEMENT  01/25/2007   25 mm [facility] pericardial tissue valve  . COLONOSCOPY WITH PROPOFOL N/A 05/10/2016   Procedure: COLONOSCOPY WITH PROPOFOL;  Surgeon: Garlan Fair, MD;  Location: [facility];  Service: Endoscopy;  Laterality: N/A;  . colonscopy    . CORONARY ARTERY BYPASS GRAFT  01/2007   x3 (LIMA->LAD, SVG->D, SVG->PDA)  . KNEE ARTHROSCOPY WITH MEDIAL MENISECTOMY Left 06/25/2019   Procedure: KNEE ARTHROSCOPY WITH MEDIAL AND LATERAL MENISECTOMY;  Surgeon: Elsie Saas, MD;  Location: [place];  Service: Orthopedics;  Laterality: Left;  .  WISDOM TOOTH EXTRACTION  1980    Current Outpatient Medications  Medication Sig Dispense Refill  . amoxicillin (AMOXIL) 500 MG capsule Take four (4) capsules by mouth as needed one (1) hour prior to dental procedures.  0  . aspirin EC 81 MG tablet Take 1 tablet (81 mg total) by mouth daily. 90 tablet 3  . atorvastatin (LIPITOR) 80 MG tablet TAKE 1 TABLET(80 MG) BY MOUTH DAILY 90 tablet 2  . carvedilol (COREG) 25 MG tablet TAKE 1 TABLET BY MOUTH TWICE DAILY 180 tablet 3  . Choline Fenofibrate 135 MG capsule Take 1 capsule (135 mg total) by mouth daily. 90 capsule 3  . doxepin (SINEQUAN) 75 MG capsule take 67ms once daily at bedtime  11  . HYDROcodone-acetaminophen (NORCO/VICODIN) 5-325 MG tablet Take 1 tablet by mouth every 4 (four) hours as needed.    .Marland Kitchenibuprofen (ADVIL,MOTRIN) 200 MG tablet Take 400-800 mg by mouth daily as needed for moderate pain.    . mometasone (ELOCON) 0.1 % cream APPLY TOPICALLY TO THE AFFECTED AREA TWICE DAILY AS NEEDED    . PRESCRIPTION MEDICATION Apply 1 application topically daily as needed (psoriasis). Steroid cream    . Tetrahydrozoline HCl (VISINE OP) Apply 1 drop to eye daily as needed (redness/dry eyes).     No current facility-administered medications for this visit.    No Known Allergies  Social History   Socioeconomic History  . Marital status: Married  Spouse name: Not on file  . Number of children: Not on file  . Years of education: Not on file  . Highest education level: Not on file  Occupational History  . Occupation: Full time  Tobacco Use  . Smoking status: Never Smoker  . Smokeless tobacco: Never Used  . Tobacco comment: Tobacco use-no  Substance and Sexual Activity  . Alcohol use: Yes    Comment: Occasionally  . Drug use: Yes    Types: Marijuana    Comment: marijuana use a few weeks ago  . Sexual activity: Not on file  Other Topics Concern  . Not on file  Social History Narrative  . Not on file   Social Determinants of  Health   Financial Resource Strain:   . Difficulty of Paying Living Expenses: Not on file  Food Insecurity:   . Worried About Charity fundraiser in the Last Year: Not on file  . Ran Out of Food in the Last Year: Not on file  Transportation Needs:   . Lack of Transportation (Medical): Not on file  . Lack of Transportation (Non-Medical): Not on file  Physical Activity:   . Days of Exercise per Week: Not on file  . Minutes of Exercise per Session: Not on file  Stress:   . Feeling of Stress : Not on file  Social Connections:   . Frequency of Communication with Friends and Family: Not on file  . Frequency of Social Gatherings with Friends and Family: Not on file  . Attends Religious Services: Not on file  . Active Member of Clubs or Organizations: Not on file  . Attends Archivist Meetings: Not on file  . Marital Status: Not on file  Intimate Partner Violence:   . Fear of Current or Ex-Partner: Not on file  . Emotionally Abused: Not on file  . Physically Abused: Not on file  . Sexually Abused: Not on file    Family History  Problem Relation Age of Onset  . Alzheimer's disease Father   . Coronary artery disease Other   . Cancer Other 91  . Other Sister 44       brain tumor    Review of Systems:  As stated in the HPI and otherwise negative.   BP (!) 148/80   Pulse 75   Ht 6' (1.829 m)   Wt 182 lb 12.8 oz (82.9 kg)   SpO2 96%   BMI 24.79 kg/m   Physical Examination: General: Well developed, well nourished, NAD  HEENT: OP clear, mucus membranes moist  SKIN: warm, dry. No rashes. Neuro: No focal deficits  Musculoskeletal: Muscle strength 5/5 all ext  Psychiatric: Mood and affect normal  Neck: No JVD, no carotid bruits, no thyromegaly, no lymphadenopathy.  Lungs:Clear bilaterally, no wheezes, rhonci, crackles Cardiovascular: Regular rate and rhythm. Systolic murmur.  Abdomen:Soft. Bowel sounds present. Non-tender.  Extremities: No lower extremity edema.  Pulses are 2 + in the bilateral DP/PT.  EKG:  EKG is not ordered today. The ekg ordered today demonstrates   Echo April 2021:  1. When compared to te prior study from 10/23/2018 transortic gradients  are unchanched. Ascendung aaortic dilatation increased from 44 to 46 mm.  Correlation with a CTA or MRA is recommended.  2. Left ventricular ejection fraction, by estimation, is 60 to 65%. The  left ventricle has normal function. The left ventricle has no regional  wall motion abnormalities. There is mild concentric left ventricular  hypertrophy. Left ventricular diastolic  parameters are consistent with Grade II diastolic dysfunction  (pseudonormalization). Elevated left atrial pressure. The average left  ventricular global longitudinal strain is -14.1 %.  3. Right ventricular systolic function is normal. The right ventricular  size is normal. There is normal pulmonary artery systolic pressure.  4. Left atrial size was mildly dilated.  5. The mitral valve is normal in structure. No evidence of mitral valve  regurgitation. No evidence of mitral stenosis.  6. The aortic valve has been repaired/replaced. Aortic valve  regurgitation is not visualized. Moderate to severe aortic valve stenosis.  There is a 25 mm Magna valve present in the aortic position. Procedure  Date: 2009. Aortic valve mean gradient  measures 30.0 mmHg.  7. Aortic dilatation noted. Aneurysm of the ascending aorta, measuring 46  mm.  8. The inferior vena cava is normal in size with greater than 50%  respiratory variability, suggesting right atrial pressure of 3 mmHg.   Comparison(s): 10/23/18 EF 60-65%. Severe AS 56mHg mean, 552mg peak.  Ascending aortic dimension 4432m  Recent Labs: 06/21/2019: ALT 27; BUN 17; Creatinine, Ser 1.07; Hemoglobin 14.9; Platelets 244; Potassium 5.1; Sodium 139   Lipid Panel    Component Value Date/Time   CHOL 114 03/14/2017 0811   TRIG 151 (H) 03/14/2017 0811   HDL 36 (L)  03/14/2017 0811   CHOLHDL 3.2 03/14/2017 0811   CHOLHDL 4.7 09/24/2015 0806   VLDL 71 (H) 09/24/2015 0806   LDLCALC 48 03/14/2017 0811   LDLDIRECT 52.0 06/17/2014 1505     Wt Readings from Last 3 Encounters:  10/14/19 182 lb 12.8 oz (82.9 kg)  07/03/19 181 lb 12.8 oz (82.5 kg)  06/25/19 181 lb (82.1 kg)     Other studies Reviewed: Additional studies/ records that were reviewed today include:  Review of the above records demonstrates:    Assessment and Plan:   1. Aortic stenosis s/p bioprosthetic AVR: He is s/p bioprosthetic aortic valve replacement in 2009. Most recent echo in April 2021 with moderate stenosis of the bioprosthetic aortic valve. He has no dyspnea or signs of CHF. Will continue ASA and repeat his echo now.   2. CAD s/p CABG: He has no chest pain. Continue ASA, statin and beta blocker.   3. HTN: BP is well controlled at home. Continue current therapy  4. Hyperlipidemia: LDL at goal in primary care. LDL 55. Continue statin  5. Thoracic aortic aneurysm: Stable at 4.5 cm by chest MRA October 2020. Repeat October 2020.   Current medicines are reviewed at length with the patient today.  The patient does not have concerns regarding medicines.  The following changes have been made:  no change  Labs/ tests ordered today include:   Orders Placed This Encounter  Procedures  . ECHOCARDIOGRAM COMPLETE   Disposition:   FU with  in 6 months  Signed, ChrLauree ChandlerD 10/14/2019 9:00 AM    [email or web address]  [id number]: [phone]: [phone]

## 2019-10-14 NOTE — Patient Instructions (Signed)
Medication Instructions:  No changes *If you need a refill on your cardiac medications before your next appointment, please call your pharmacy*   Lab Work: none If you have labs (blood work) drawn today and your tests are completely normal, you will receive your results only by: . MyChart Message (if you have MyChart) OR . A paper copy in the mail If you have any lab test that is abnormal or we need to change your treatment, we will call you to review the results.   Testing/Procedures: Your physician has requested that you have an echocardiogram. Echocardiography is a painless test that uses sound waves to create images of your heart. It provides your doctor with information about the size and shape of your heart and how well your heart's chambers and valves are working. This procedure takes approximately one hour. There are no restrictions for this procedure.   Follow-Up: At [facility], you and your health needs are our priority.  As part of our continuing mission to provide you with exceptional heart care, we have created designated Provider Care Teams.  These Care Teams include your primary Cardiologist (physician) and Advanced Practice Providers (APPs -  Physician Assistants and Nurse Practitioners) who all work together to provide you with the care you need, when you need it.  Your next appointment:   6  month(s)  The format for your next appointment:   In Person  Provider:   You may see Christopher McAlhany, MD or one of the following Advanced Practice Providers on your designated Care Team:    Dayna Dunn, PA-C  Michele Lenze, PA-C    Other Instructions   

## 2019-10-30 ENCOUNTER — Ambulatory Visit (HOSPITAL_COMMUNITY): Payer: BC Managed Care – PPO | Attending: Cardiovascular Disease

## 2019-10-30 ENCOUNTER — Other Ambulatory Visit: Payer: Self-pay

## 2019-10-30 DIAGNOSIS — I350 Nonrheumatic aortic (valve) stenosis: Secondary | ICD-10-CM | POA: Diagnosis not present

## 2019-10-30 LAB — ECHOCARDIOGRAM COMPLETE
AR max vel: 0.77 cm2
AV Area VTI: 0.86 cm2
AV Area mean vel: 0.75 cm2
AV Mean grad: 32.5 mmHg
AV Peak grad: 57.8 mmHg
Ao pk vel: 3.8 m/s
Area-P 1/2: 2.27 cm2
S' Lateral: 2.6 cm

## 2019-11-01 ENCOUNTER — Ambulatory Visit (HOSPITAL_COMMUNITY)
Admission: RE | Admit: 2019-11-01 | Discharge: 2019-11-01 | Disposition: A | Discharge status: Home / Self Care | Payer: BC Managed Care – PPO | Source: Ambulatory Visit | Attending: Cardiovascular Disease | Admitting: Cardiovascular Disease

## 2019-11-01 DIAGNOSIS — Z952 Presence of prosthetic heart valve: Secondary | ICD-10-CM | POA: Diagnosis not present

## 2019-11-01 DIAGNOSIS — I712 Thoracic aortic aneurysm, without rupture, unspecified: Secondary | ICD-10-CM

## 2019-11-01 DIAGNOSIS — Z9889 Other specified postprocedural states: Secondary | ICD-10-CM | POA: Diagnosis not present

## 2019-11-01 MED ORDER — GADOBUTROL 1 MMOL/ML IV SOLN
8.5000 mL | Freq: Once | INTRAVENOUS | Status: AC | PRN
  Administered 2019-11-01: 8.5 mL via INTRAVENOUS

## 2019-11-29 DIAGNOSIS — Z20822 Contact with and (suspected) exposure to covid-19: Secondary | ICD-10-CM | POA: Diagnosis not present

## 2019-11-29 DIAGNOSIS — Z03818 Encounter for observation for suspected exposure to other biological agents ruled out: Secondary | ICD-10-CM | POA: Diagnosis not present

## 2019-12-31 ENCOUNTER — Other Ambulatory Visit: Payer: Self-pay | Admitting: Nurse Practitioner

## 2019-12-31 ENCOUNTER — Encounter: Payer: Self-pay | Admitting: Nurse Practitioner

## 2019-12-31 ENCOUNTER — Ambulatory Visit (HOSPITAL_COMMUNITY)
Admission: RE | Admit: 2019-12-31 | Discharge: 2019-12-31 | Disposition: A | Discharge status: Home / Self Care | Payer: BC Managed Care – PPO | Source: Ambulatory Visit | Attending: Pulmonary Disease | Admitting: Pulmonary Disease

## 2019-12-31 DIAGNOSIS — U071 COVID-19: Secondary | ICD-10-CM

## 2019-12-31 MED ORDER — METHYLPREDNISOLONE SODIUM SUCC 125 MG IJ SOLR: 125.0000 mg | Freq: Once | INTRAMUSCULAR | Status: DC | PRN

## 2019-12-31 MED ORDER — SODIUM CHLORIDE 0.9 % IV SOLN: INTRAVENOUS | Status: DC | PRN

## 2019-12-31 MED ORDER — ALBUTEROL SULFATE HFA 108 (90 BASE) MCG/ACT IN AERS: 2.0000 | INHALATION_SPRAY | Freq: Once | RESPIRATORY_TRACT | Status: DC | PRN

## 2019-12-31 MED ORDER — DIPHENHYDRAMINE HCL 50 MG/ML IJ SOLN: 50.0000 mg | Freq: Once | INTRAMUSCULAR | Status: DC | PRN

## 2019-12-31 MED ORDER — FAMOTIDINE IN NACL 20-0.9 MG/50ML-% IV SOLN: 20.0000 mg | Freq: Once | INTRAVENOUS | Status: DC | PRN

## 2019-12-31 MED ORDER — EPINEPHRINE 0.3 MG/0.3ML IJ SOAJ: 0.3000 mg | Freq: Once | INTRAMUSCULAR | Status: DC | PRN

## 2019-12-31 MED ORDER — SODIUM CHLORIDE 0.9 % IV SOLN: Freq: Once | INTRAVENOUS | Status: AC

## 2019-12-31 NOTE — Discharge Instructions (Signed)
10 Things You Can Do to Manage Your COVID-19 Symptoms at Home If you have possible or confirmed COVID-19: 1. Stay home from work and school. And stay away from other public places. If you must go out, avoid using any kind of public transportation, ridesharing, or taxis. 2. Monitor your symptoms carefully. If your symptoms get worse, call your healthcare provider immediately. 3. Get rest and stay hydrated. 4. If you have a medical appointment, call the healthcare provider ahead of time and tell them that you have or may have COVID-19. 5. For medical emergencies, call 911 and notify the dispatch personnel that you have or may have COVID-19. 6. Cover your cough and sneezes with a tissue or use the inside of your elbow. 7. Wash your hands often with soap and water for at least 20 seconds or clean your hands with an alcohol-based hand sanitizer that contains at least 60% alcohol. 8. As much as possible, stay in a specific room and away from other people in your home. Also, you should use a separate bathroom, if available. If you need to be around other people in or outside of the home, wear a mask. 9. Avoid sharing personal items with other people in your household, like dishes, towels, and bedding. 10. Clean all surfaces that are touched often, like counters, tabletops, and doorknobs. Use household cleaning sprays or wipes according to the label instructions. cdc.gov/coronavirus 07/18/2018 This information is not intended to replace advice given to you by your health care provider. Make sure you discuss any questions you have with your health care provider. Document Revised: 12/20/2018 Document Reviewed: 12/20/2018 Elsevier Patient Education  2020 Elsevier Inc. What types of side effects do monoclonal antibody drugs cause?  Common side effects  In general, the more common side effects caused by monoclonal antibody drugs include: . Allergic reactions, such as hives or itching . Flu-like signs and  symptoms, including chills, fatigue, fever, and muscle aches and pains . Nausea, vomiting . Diarrhea . Skin rashes . Low blood pressure   The CDC is recommending patients who receive monoclonal antibody treatments wait at least 90 days before being vaccinated.  Currently, there are no data on the safety and efficacy of mRNA COVID-19 vaccines in persons who received monoclonal antibodies or convalescent plasma as part of COVID-19 treatment. Based on the estimated half-life of such therapies as well as evidence suggesting that reinfection is uncommon in the 90 days after initial infection, vaccination should be deferred for at least 90 days, as a precautionary measure until additional information becomes available, to avoid interference of the antibody treatment with vaccine-induced immune responses. If you have any questions or concerns after the infusion please call the Advanced Practice Provider on call at [phone]. This number is ONLY intended for your use regarding questions or concerns about the infusion post-treatment side-effects.  Please do not provide this number to others for use. For return to work notes please contact your primary care provider.   If someone you know is interested in receiving treatment please have them call the COVID hotline at [phone].   

## 2019-12-31 NOTE — Progress Notes (Signed)
I connected by phone with Jonathan Jones on 12/31/2019 at 1:19 PM to discuss the potential use of a treatment for mild to moderate COVID-19 viral infection in non-hospitalized patients.  This patient is a 59 y.o. male that meets the FDA criteria for Emergency Use Authorization of bamlanivimab/etesevimab, casirivimab\imdevimab, or sotrovimab  Has a (+) direct SARS-CoV-2 viral test result  Has mild or moderate COVID-19   Is ? 61 years of age and weighs ? 40 kg  Is NOT hospitalized due to COVID-19  Is NOT requiring oxygen therapy or requiring an increase in baseline oxygen flow rate due to COVID-19  Is within 10 days of symptom onset  Has at least one of the high risk factor(s) for progression to severe COVID-19 and/or hospitalization as defined in EUA.  Specific high risk criteria : Cardiovascular disease or hypertension and Other high risk medical condition per CDC:  heart disease. HTN. Valve replacement.    I have spoken and communicated the following to the patient or parent/caregiver:  1. FDA has authorized the emergency use of bamlanivimab/etesevimab, casirivimab\imdevimab, or sotrovimab for the treatment of mild to moderate COVID-19 in adults and pediatric patients with positive results of direct SARS-CoV-2 viral testing who are 42 years of age and older weighing at least 40 kg, and who are at high risk for progressing to severe COVID-19 and/or hospitalization.  2. The significant known and potential risks and benefits of bamlanivimab/etesevimab, casirivimab\imdevimab, or sotrovimab, and the extent to which such potential risks and benefits are unknown.  3. Information on available alternative treatments and the risks and benefits of those alternatives, including clinical trials.  4. Patients treated with bamlanivimab/etesevimab, casirivimab\imdevimab, or sotrovimab should continue to self-isolate and use infection control measures (e.g., wear mask, isolate, social distance, avoid  sharing personal items, clean and disinfect "high touch" surfaces, and frequent handwashing) according to CDC guidelines.   5. The patient or parent/caregiver has the option to accept or refuse bamlanivimab/etesevimab, casirivimab\imdevimab, or sotrovimab.  After reviewing this information with the patient, the patient has agreed to receive one of the available covid 19 monoclonal antibodies and will be provided an appropriate fact sheet prior to infusion.Consuello Masse, DNP, AGNP-C ([phone] (Infusion Center Hotline)

## 2019-12-31 NOTE — Progress Notes (Signed)
Patient reviewed Fact Sheet for Patients, Parents, and Caregivers for Emergency Use Authorization (EUA) of Sotrovimab for the Treatment of Coronavirus. Patient also reviewed and is agreeable to the estimated cost of treatment. Patient is agreeable to proceed.   

## 2019-12-31 NOTE — Progress Notes (Signed)
  Diagnosis: COVID-19  Physician: Dr. Wright  Procedure: [facility] Med: bamlanivimab\etesevimab infusion - Provided patient with bamlanimivab\etesevimab fact sheet for patients, parents and caregivers prior to infusion.  Complications: No immediate complications noted.  Discharge: Discharged home   Jonathan Jones 12/31/2019  

## 2020-01-20 DIAGNOSIS — M1712 Unilateral primary osteoarthritis, left knee: Secondary | ICD-10-CM | POA: Diagnosis not present

## 2020-01-28 DIAGNOSIS — M1712 Unilateral primary osteoarthritis, left knee: Secondary | ICD-10-CM | POA: Diagnosis not present

## 2020-02-04 DIAGNOSIS — M1712 Unilateral primary osteoarthritis, left knee: Secondary | ICD-10-CM | POA: Diagnosis not present

## 2020-02-11 DIAGNOSIS — M7582 Other shoulder lesions, left shoulder: Secondary | ICD-10-CM | POA: Diagnosis not present

## 2020-02-11 DIAGNOSIS — M1712 Unilateral primary osteoarthritis, left knee: Secondary | ICD-10-CM | POA: Diagnosis not present

## 2020-02-28 DIAGNOSIS — Z125 Encounter for screening for malignant neoplasm of prostate: Secondary | ICD-10-CM | POA: Diagnosis not present

## 2020-02-28 DIAGNOSIS — E785 Hyperlipidemia, unspecified: Secondary | ICD-10-CM | POA: Diagnosis not present

## 2020-02-28 DIAGNOSIS — Z0000 Encounter for general adult medical examination without abnormal findings: Secondary | ICD-10-CM | POA: Diagnosis not present

## 2020-03-06 DIAGNOSIS — R82998 Other abnormal findings in urine: Secondary | ICD-10-CM | POA: Diagnosis not present

## 2020-03-06 DIAGNOSIS — I10 Essential (primary) hypertension: Secondary | ICD-10-CM | POA: Diagnosis not present

## 2020-03-06 DIAGNOSIS — I350 Nonrheumatic aortic (valve) stenosis: Secondary | ICD-10-CM | POA: Diagnosis not present

## 2020-03-06 DIAGNOSIS — Z0000 Encounter for general adult medical examination without abnormal findings: Secondary | ICD-10-CM | POA: Diagnosis not present

## 2020-03-06 DIAGNOSIS — Z1212 Encounter for screening for malignant neoplasm of rectum: Secondary | ICD-10-CM | POA: Diagnosis not present

## 2020-03-14 ENCOUNTER — Other Ambulatory Visit: Payer: Self-pay | Admitting: Cardiovascular Disease

## 2020-03-14 DIAGNOSIS — I712 Thoracic aortic aneurysm, without rupture, unspecified: Secondary | ICD-10-CM

## 2020-03-14 DIAGNOSIS — Z952 Presence of prosthetic heart valve: Secondary | ICD-10-CM

## 2020-03-14 DIAGNOSIS — I2510 Atherosclerotic heart disease of native coronary artery without angina pectoris: Secondary | ICD-10-CM

## 2020-03-14 DIAGNOSIS — I10 Essential (primary) hypertension: Secondary | ICD-10-CM

## 2020-03-14 DIAGNOSIS — Q231 Congenital insufficiency of aortic valve: Secondary | ICD-10-CM

## 2020-03-14 DIAGNOSIS — E785 Hyperlipidemia, unspecified: Secondary | ICD-10-CM

## 2020-03-18 ENCOUNTER — Telehealth: Payer: Self-pay

## 2020-03-18 MED ORDER — FENOFIBRATE 160 MG PO TABS: 160.0000 mg | ORAL_TABLET | Freq: Every day | ORAL | 3 refills | Status: DC

## 2020-03-18 NOTE — Telephone Encounter (Signed)
**Note De-Identified Steven Veazie Obfuscation** Letter received Jontae Adebayo fax from [facility] stating that they denied coverage of the pts Fenofibrate 135 mg.  Reason: The pt must first try and fail Fenofibrate 48 mg (generic Tricor),  Fenofibrate 54/Fenofibrate 160 mg (generic Lofibra tablets),  Fenofibrate Micronized 67 mg, Fenofibrate Micronized 160 mg, or Fenofibrate Micronized 200 mg (generic Lofibra Capsules).  Will forward this phone note to Dr Clifton James and his nurse for advisement to the pt.

## 2020-03-18 NOTE — Telephone Encounter (Signed)
Called pt and let him know that we were doing to change the formulation and dosage to what the insurance will cover. He states he has about 4 months worth of the 135mg  and he should be good for awhile. New Rx sent. Pharmacy can keep on file

## 2020-03-18 NOTE — Telephone Encounter (Signed)
This is not a new medication or dose for this patient.  Will route to PharmD for recommendations.

## 2020-04-06 ENCOUNTER — Ambulatory Visit: Payer: BC Managed Care – PPO | Admitting: Cardiovascular Disease

## 2020-04-06 ENCOUNTER — Encounter: Payer: Self-pay | Admitting: Cardiovascular Disease

## 2020-04-06 ENCOUNTER — Other Ambulatory Visit: Payer: Self-pay

## 2020-04-06 VITALS — BP 158/86 | HR 58 | Ht 72.0 in | Wt 189.6 lb

## 2020-04-06 DIAGNOSIS — I25810 Atherosclerosis of coronary artery bypass graft(s) without angina pectoris: Secondary | ICD-10-CM | POA: Diagnosis not present

## 2020-04-06 DIAGNOSIS — I712 Thoracic aortic aneurysm, without rupture, unspecified: Secondary | ICD-10-CM

## 2020-04-06 DIAGNOSIS — E785 Hyperlipidemia, unspecified: Secondary | ICD-10-CM

## 2020-04-06 DIAGNOSIS — I10 Essential (primary) hypertension: Secondary | ICD-10-CM

## 2020-04-06 DIAGNOSIS — I350 Nonrheumatic aortic (valve) stenosis: Secondary | ICD-10-CM

## 2020-04-06 DIAGNOSIS — Z79899 Other long term (current) drug therapy: Secondary | ICD-10-CM

## 2020-04-06 DIAGNOSIS — Z01810 Encounter for preprocedural cardiovascular examination: Secondary | ICD-10-CM

## 2020-04-06 MED ORDER — LOSARTAN POTASSIUM 25 MG PO TABS: 25.0000 mg | ORAL_TABLET | Freq: Every day | ORAL | 3 refills | Status: DC

## 2020-04-06 NOTE — Patient Instructions (Signed)
Medication Instructions:  Your physician has recommended you make the following change in your medication: Start Losartan 25 mg daily for BP   *If you need a refill on your cardiac medications before your next appointment, please call your pharmacy*   Lab Work: Bmet in one week   If you have labs (blood work) drawn today and your tests are completely normal, you will receive your results only by: Marland Kitchen MyChart Message (if you have MyChart) OR . A paper copy in the mail If you have any lab test that is abnormal or we need to change your treatment, we will call you to review the results.   Testing/Procedures:  Your physician has requested that you have an echocardiogram. Echocardiography is a painless test that uses sound waves to create images of your heart. It provides your doctor with information about the size and shape of your heart and how well your heart's chambers and valves are working. This procedure takes approximately one hour. There are no restrictions for this procedure. October 2022  Chest MRA for Aorta on October 2022    Follow-Up: At [facility], you and your health needs are our priority.  As part of our continuing mission to provide you with exceptional heart care, we have created designated Provider Care Teams.  These Care Teams include your primary Cardiologist (physician) and Advanced Practice Providers (APPs -  Physician Assistants and Nurse Practitioners) who all work together to provide you with the care you need, when you need it.  We recommend signing up for the patient portal called "MyChart".  Sign up information is provided on this After Visit Summary.  MyChart is used to connect with patients for Virtual Visits (Telemedicine).  Patients are able to view lab/test results, encounter n otes, upcoming appointments, etc.  Non-urgent messages can be sent to your provider as well.   To learn more about what you can do with MyChart, go to [email or web address].     Your next appointment:   8 month(s)  The format for your next appointment:   In Person  Provider:   Verne Carrow, MD   Other Instructions

## 2020-04-06 NOTE — Progress Notes (Signed)
Chief Complaint  Patient presents with  . Follow-up    Aortic valve disease   History of Present Illness: 60 yo male with history of CAD s/p 3V CABG in 2009, bicuspid aortic valve status post bioprosthetic aortic valve replacement in 2009, HTN, hyperlipidemia and thoracic aortic aneurysm here today for cardiac follow up. He has been followed in our office by Dr. Saunders Revel. I met him for the first time in January 2020. Echo in October 2021 with moderate aortic stenosis (mean gradient 32 mmHg). LV systolic function was normal. Thoracic MRA October 2021 with stable dilation of the ascending aorta at 4.5 cm.   He is here today for follow up. The patient denies any chest pain, dyspnea, palpitations, lower extremity edema, orthopnea, PND, dizziness, near syncope or syncope. He is very active at home. Only has dyspnea with extreme exertion.   Primary Care Physician: Velna Hatchet, MD  Past Medical History:  Diagnosis Date  . Aortic stenosis   . Arthritis    hands  . Complex tear of lateral meniscus of left knee as current injury   . Complex tear of medial meniscus of left knee   . Coronary artery disease   . GERD (gastroesophageal reflux disease)   . Heart murmur   . Hypertension    Unspecified  . Migraine headache   . Mixed hyperlipidemia   . Thoracic aortic aneurysm (HCC)    4.4 cm ascending aorta (06/2016)    Past Surgical History:  Procedure Laterality Date  . AORTIC VALVE REPLACEMENT  01/25/2007   25 mm [facility] pericardial tissue valve  . COLONOSCOPY WITH PROPOFOL N/A 05/10/2016   Procedure: COLONOSCOPY WITH PROPOFOL;  Surgeon: Garlan Fair, MD;  Location: [facility];  Service: Endoscopy;  Laterality: N/A;  . colonscopy    . CORONARY ARTERY BYPASS GRAFT  01/2007   x3 (LIMA->LAD, SVG->D, SVG->PDA)  . KNEE ARTHROSCOPY WITH MEDIAL MENISECTOMY Left 06/25/2019   Procedure: KNEE ARTHROSCOPY WITH MEDIAL AND LATERAL MENISECTOMY;  Surgeon: Elsie Saas, MD;  Location: [place];  Service: Orthopedics;  Laterality: Left;  . WISDOM TOOTH EXTRACTION  1980    Current Outpatient Medications  Medication Sig Dispense Refill  . amoxicillin (AMOXIL) 500 MG capsule Take four (4) capsules by mouth as needed one (1) hour prior to dental procedures.  0  . aspirin EC 81 MG tablet Take 1 tablet (81 mg total) by mouth daily. 90 tablet 3  . atorvastatin (LIPITOR) 80 MG tablet TAKE 1 TABLET(80 MG) BY MOUTH DAILY 90 tablet 1  . carvedilol (COREG) 25 MG tablet TAKE 1 TABLET BY MOUTH TWICE DAILY 180 tablet 3  . doxepin (SINEQUAN) 75 MG capsule take 38ms once daily at bedtime  11  . fenofibrate 160 MG tablet Take 1 tablet (160 mg total) by mouth daily. 90 tablet 3  . HYDROcodone-acetaminophen (NORCO/VICODIN) 5-325 MG tablet Take 1 tablet by mouth every 4 (four) hours as needed.    .Marland Kitchenibuprofen (ADVIL,MOTRIN) 200 MG tablet Take 400-800 mg by mouth daily as needed for moderate pain.    .Marland Kitchenlosartan (COZAAR) 25 MG tablet Take 1 tablet (25 mg total) by mouth daily. 90 tablet 3  . mometasone (ELOCON) 0.1 % cream APPLY TOPICALLY TO THE AFFECTED AREA TWICE DAILY AS NEEDED    . PRESCRIPTION MEDICATION Apply 1 application topically daily as needed (psoriasis). Steroid cream    . Tetrahydrozoline HCl (VISINE OP) Apply 1 drop to eye daily as needed (redness/dry eyes).  No current facility-administered medications for this visit.    No Known Allergies  Social History   Socioeconomic History  . Marital status: Married    Spouse name: Not on file  . Number of children: Not on file  . Years of education: Not on file  . Highest education level: Not on file  Occupational History  . Occupation: Full time  Tobacco Use  . Smoking status: Never Smoker  . Smokeless tobacco: Never Used  . Tobacco comment: Tobacco use-no  Substance and Sexual Activity  . Alcohol use: Yes    Comment: Occasionally  . Drug use: Yes    Types: Marijuana    Comment: marijuana use a few weeks ago  .  Sexual activity: Not on file  Other Topics Concern  . Not on file  Social History Narrative  . Not on file   Social Determinants of Health   Financial Resource Strain: Not on file  Food Insecurity: Not on file  Transportation Needs: Not on file  Physical Activity: Not on file  Stress: Not on file  Social Connections: Not on file  Intimate Partner Violence: Not on file    Family History  Problem Relation Age of Onset  . Alzheimer's disease Father   . Coronary artery disease Other   . Cancer Other 91  . Other Sister 57       brain tumor    Review of Systems:  As stated in the HPI and otherwise negative.   BP (!) 158/86   Pulse (!) 58   Ht 6' (1.829 m)   Wt 189 lb 10.1 oz (86 kg)   SpO2 98%   BMI 25.72 kg/m   Physical Examination: General: Well developed, well nourished, NAD  HEENT: OP clear, mucus membranes moist  SKIN: warm, dry. No rashes. Neuro: No focal deficits  Musculoskeletal: Muscle strength 5/5 all ext  Psychiatric: Mood and affect normal  Neck: No JVD, no carotid bruits, no thyromegaly, no lymphadenopathy.  Lungs:Clear bilaterally, no wheezes, rhonci, crackles Cardiovascular: Regular rate and rhythm. No murmurs, gallops or rubs. Abdomen:Soft. Bowel sounds present. Non-tender.  Extremities: No lower extremity edema. Pulses are 2 + in the bilateral DP/PT.  EKG:  EKG is not ordered today. The ekg ordered today demonstrates   Echo October 2021: 1. Left ventricular ejection fraction, by estimation, is 65 to 70%. The  left ventricle has normal function. The left ventricle has no regional  wall motion abnormalities. Left ventricular diastolic parameters are  consistent with Grade I diastolic  dysfunction (impaired relaxation).  2. Right ventricular systolic function is normal. The right ventricular  size is normal.  3. The mitral valve is normal in structure. No evidence of mitral valve  regurgitation. No evidence of mitral stenosis.  4. The mean AV  gradient is 32 mmHg compared to 30 mmHg from previous echo  in April, 2021. . The aortic valve has been repaired/replaced. Aortic  valve regurgitation is not visualized. Moderate aortic valve stenosis.  5. Aortic dilatation noted. There is moderate dilatation of the ascending  aorta, measuring 47 mm.    Recent Labs: 06/21/2019: ALT 27; BUN 17; Creatinine, Ser 1.07; Hemoglobin 14.9; Platelets 244; Potassium 5.1; Sodium 139   Lipid Panel    Component Value Date/Time   CHOL 114 03/14/2017 0811   TRIG 151 (H) 03/14/2017 0811   HDL 36 (L) 03/14/2017 0811   CHOLHDL 3.2 03/14/2017 0811   CHOLHDL 4.7 09/24/2015 0806   VLDL 71 (H) 09/24/2015 6045  LDLCALC 48 03/14/2017 0811   LDLDIRECT 52.0 06/17/2014 1505     Wt Readings from Last 3 Encounters:  04/06/20 189 lb 10.1 oz (86 kg)  10/14/19 182 lb 12.8 oz (82.9 kg)  07/03/19 181 lb 12.8 oz (82.5 kg)     Other studies Reviewed: Additional studies/ records that were reviewed today include:  Review of the above records demonstrates:    Assessment and Plan:   1. Aortic stenosis s/p bioprosthetic AVR: He is s/p bioprosthetic aortic valve replacement in 2009. Most recent echo in October 2021 with moderate stenosis of the bioprosthetic aortic valve. No dyspnea or CHF. Continue ASA. Repeat echo in October 2022.    2. CAD s/p CABG: No chest pain. Continue ASA, statin and beta blocker  3. HTN: BP is elevated today. Will start Losartan 25 mg daily. BMET one week.   4. Hyperlipidemia: LDL at goal in primary care. Will continue statin  5. Thoracic aortic aneurysm: Stable at 4.5 cm by chest MRA October 2021. Repeat October 2022  Current medicines are reviewed at length with the patient today.  The patient does not have concerns regarding medicines.  The following changes have been made:  no change  Labs/ tests ordered today include:   Orders Placed This Encounter  Procedures  . MR Angiogram Chest W Wo Contrast  . Basic metabolic panel   . Basic metabolic panel  . ECHOCARDIOGRAM COMPLETE   Disposition:   F/U with me in 8 months  Signed, Lauree Chandler, MD 04/06/2020 10:32 AM    [facility] [place], [place], [place]  [address] Phone: [phone]; Fax: [phone]

## 2020-04-13 ENCOUNTER — Other Ambulatory Visit: Payer: Self-pay

## 2020-04-13 ENCOUNTER — Other Ambulatory Visit: Payer: BC Managed Care – PPO | Admitting: *Deleted

## 2020-04-13 DIAGNOSIS — I712 Thoracic aortic aneurysm, without rupture, unspecified: Secondary | ICD-10-CM

## 2020-04-13 DIAGNOSIS — E785 Hyperlipidemia, unspecified: Secondary | ICD-10-CM

## 2020-04-13 DIAGNOSIS — I25810 Atherosclerosis of coronary artery bypass graft(s) without angina pectoris: Secondary | ICD-10-CM

## 2020-04-13 DIAGNOSIS — I350 Nonrheumatic aortic (valve) stenosis: Secondary | ICD-10-CM | POA: Diagnosis not present

## 2020-04-13 DIAGNOSIS — Z79899 Other long term (current) drug therapy: Secondary | ICD-10-CM

## 2020-04-13 DIAGNOSIS — I10 Essential (primary) hypertension: Secondary | ICD-10-CM

## 2020-04-14 LAB — BASIC METABOLIC PANEL
BUN/Creatinine Ratio: 19 (ref 10–24)
BUN: 24 mg/dL (ref 8–27)
CO2: 28 mmol/L (ref 20–29)
Calcium: 9.3 mg/dL (ref 8.6–10.2)
Chloride: 102 mmol/L (ref 96–106)
Creatinine, Ser: 1.26 mg/dL (ref 0.76–1.27)
Glucose: 83 mg/dL (ref 65–99)
Potassium: 4.3 mmol/L (ref 3.5–5.2)
Sodium: 142 mmol/L (ref 134–144)
eGFR: 64 mL/min/{1.73_m2} (ref >59)

## 2020-04-23 ENCOUNTER — Other Ambulatory Visit (HOSPITAL_COMMUNITY): Payer: BC Managed Care – PPO

## 2020-07-02 DIAGNOSIS — I10 Essential (primary) hypertension: Secondary | ICD-10-CM

## 2020-07-02 DIAGNOSIS — Q2381 Bicuspid aortic valve: Secondary | ICD-10-CM

## 2020-07-02 DIAGNOSIS — Z952 Presence of prosthetic heart valve: Secondary | ICD-10-CM

## 2020-07-02 DIAGNOSIS — Q231 Congenital insufficiency of aortic valve: Secondary | ICD-10-CM

## 2020-07-02 DIAGNOSIS — E785 Hyperlipidemia, unspecified: Secondary | ICD-10-CM

## 2020-07-02 DIAGNOSIS — I2510 Atherosclerotic heart disease of native coronary artery without angina pectoris: Secondary | ICD-10-CM

## 2020-07-02 DIAGNOSIS — I712 Thoracic aortic aneurysm, without rupture, unspecified: Secondary | ICD-10-CM

## 2020-07-02 MED ORDER — FENOFIBRATE 160 MG PO TABS: 160.0000 mg | ORAL_TABLET | Freq: Every day | ORAL | 3 refills | Status: DC

## 2020-07-02 MED ORDER — ATORVASTATIN CALCIUM 80 MG PO TABS: 80.0000 mg | ORAL_TABLET | Freq: Every day | ORAL | 3 refills | Status: DC

## 2020-07-02 MED ORDER — CARVEDILOL 25 MG PO TABS: ORAL_TABLET | ORAL | 3 refills | Status: DC

## 2020-08-20 DIAGNOSIS — G43111 Migraine with aura, intractable, with status migrainosus: Secondary | ICD-10-CM | POA: Diagnosis not present

## 2020-09-02 DIAGNOSIS — L814 Other melanin hyperpigmentation: Secondary | ICD-10-CM | POA: Diagnosis not present

## 2020-09-02 DIAGNOSIS — L821 Other seborrheic keratosis: Secondary | ICD-10-CM | POA: Diagnosis not present

## 2020-09-02 DIAGNOSIS — D1801 Hemangioma of skin and subcutaneous tissue: Secondary | ICD-10-CM | POA: Diagnosis not present

## 2020-10-26 ENCOUNTER — Other Ambulatory Visit: Payer: BC Managed Care – PPO

## 2020-10-29 ENCOUNTER — Other Ambulatory Visit: Payer: BC Managed Care – PPO

## 2020-11-06 ENCOUNTER — Other Ambulatory Visit: Payer: Self-pay

## 2020-11-06 ENCOUNTER — Ambulatory Visit (HOSPITAL_COMMUNITY): Payer: BC Managed Care – PPO | Attending: Cardiology

## 2020-11-06 DIAGNOSIS — I712 Thoracic aortic aneurysm, without rupture, unspecified: Secondary | ICD-10-CM | POA: Diagnosis not present

## 2020-11-06 DIAGNOSIS — I350 Nonrheumatic aortic (valve) stenosis: Secondary | ICD-10-CM | POA: Diagnosis not present

## 2020-11-06 LAB — ECHOCARDIOGRAM COMPLETE
AR max vel: 0.64 cm2
AV Area VTI: 0.72 cm2
AV Area mean vel: 0.59 cm2
AV Mean grad: 35.4 mmHg
AV Peak grad: 56.6 mmHg
Ao pk vel: 3.76 m/s
Area-P 1/2: 2.99 cm2
S' Lateral: 2.8 cm

## 2020-12-02 ENCOUNTER — Ambulatory Visit: Payer: BC Managed Care – PPO | Admitting: Cardiovascular Disease

## 2020-12-28 ENCOUNTER — Ambulatory Visit
Admission: RE | Admit: 2020-12-28 | Discharge: 2020-12-28 | Disposition: A | Discharge status: Home / Self Care | Payer: BC Managed Care – PPO | Source: Ambulatory Visit | Attending: Cardiovascular Disease | Admitting: Cardiovascular Disease

## 2020-12-28 ENCOUNTER — Other Ambulatory Visit: Payer: Self-pay

## 2020-12-28 DIAGNOSIS — I350 Nonrheumatic aortic (valve) stenosis: Secondary | ICD-10-CM

## 2020-12-28 DIAGNOSIS — I7121 Aneurysm of the ascending aorta, without rupture: Secondary | ICD-10-CM | POA: Diagnosis not present

## 2020-12-28 DIAGNOSIS — I712 Thoracic aortic aneurysm, without rupture, unspecified: Secondary | ICD-10-CM

## 2020-12-28 DIAGNOSIS — I25810 Atherosclerosis of coronary artery bypass graft(s) without angina pectoris: Secondary | ICD-10-CM

## 2020-12-28 DIAGNOSIS — E785 Hyperlipidemia, unspecified: Secondary | ICD-10-CM

## 2020-12-28 DIAGNOSIS — I10 Essential (primary) hypertension: Secondary | ICD-10-CM

## 2020-12-28 DIAGNOSIS — Z79899 Other long term (current) drug therapy: Secondary | ICD-10-CM

## 2020-12-28 MED ORDER — GADOBENATE DIMEGLUMINE 529 MG/ML IV SOLN
17.0000 mL | Freq: Once | INTRAVENOUS | Status: AC | PRN
  Administered 2020-12-28: 17 mL via INTRAVENOUS

## 2021-01-01 ENCOUNTER — Other Ambulatory Visit: Payer: Self-pay | Admitting: Cardiovascular Disease

## 2021-01-17 HISTORY — PX: AORTIC VALVE REPLACEMENT: SHX41

## 2021-03-21 ENCOUNTER — Other Ambulatory Visit: Payer: Self-pay | Admitting: Cardiovascular Disease

## 2021-03-22 DIAGNOSIS — Z1331 Encounter for screening for depression: Secondary | ICD-10-CM | POA: Diagnosis not present

## 2021-03-22 DIAGNOSIS — Z0000 Encounter for general adult medical examination without abnormal findings: Secondary | ICD-10-CM | POA: Diagnosis not present

## 2021-03-22 DIAGNOSIS — Z1339 Encounter for screening examination for other mental health and behavioral disorders: Secondary | ICD-10-CM | POA: Diagnosis not present

## 2021-03-22 DIAGNOSIS — I350 Nonrheumatic aortic (valve) stenosis: Secondary | ICD-10-CM | POA: Diagnosis not present

## 2021-03-25 DIAGNOSIS — R82998 Other abnormal findings in urine: Secondary | ICD-10-CM | POA: Diagnosis not present

## 2021-03-25 DIAGNOSIS — W57XXXA Bitten or stung by nonvenomous insect and other nonvenomous arthropods, initial encounter: Secondary | ICD-10-CM | POA: Diagnosis not present

## 2021-03-25 DIAGNOSIS — G43909 Migraine, unspecified, not intractable, without status migrainosus: Secondary | ICD-10-CM | POA: Diagnosis not present

## 2021-03-25 DIAGNOSIS — R5383 Other fatigue: Secondary | ICD-10-CM | POA: Diagnosis not present

## 2021-03-25 DIAGNOSIS — I10 Essential (primary) hypertension: Secondary | ICD-10-CM | POA: Diagnosis not present

## 2021-03-25 DIAGNOSIS — M791 Myalgia, unspecified site: Secondary | ICD-10-CM | POA: Diagnosis not present

## 2021-04-01 DIAGNOSIS — M25562 Pain in left knee: Secondary | ICD-10-CM | POA: Diagnosis not present

## 2021-04-08 ENCOUNTER — Ambulatory Visit: Payer: BC Managed Care – PPO | Admitting: Cardiovascular Disease

## 2021-04-08 ENCOUNTER — Other Ambulatory Visit: Payer: Self-pay

## 2021-04-08 DIAGNOSIS — I2510 Atherosclerotic heart disease of native coronary artery without angina pectoris: Secondary | ICD-10-CM | POA: Diagnosis not present

## 2021-04-08 DIAGNOSIS — Q231 Congenital insufficiency of aortic valve: Secondary | ICD-10-CM

## 2021-04-08 DIAGNOSIS — Z952 Presence of prosthetic heart valve: Secondary | ICD-10-CM

## 2021-04-08 DIAGNOSIS — I712 Thoracic aortic aneurysm, without rupture, unspecified: Secondary | ICD-10-CM | POA: Diagnosis not present

## 2021-04-08 DIAGNOSIS — I10 Essential (primary) hypertension: Secondary | ICD-10-CM

## 2021-04-08 DIAGNOSIS — E785 Hyperlipidemia, unspecified: Secondary | ICD-10-CM

## 2021-04-08 MED ORDER — ATORVASTATIN CALCIUM 80 MG PO TABS: 80.0000 mg | ORAL_TABLET | Freq: Every day | ORAL | 3 refills | Status: DC

## 2021-04-08 MED ORDER — CARVEDILOL 25 MG PO TABS
ORAL_TABLET | ORAL | 3 refills | Status: DC
Start: 2021-04-08 — End: 2022-06-09

## 2021-04-08 MED ORDER — LOSARTAN POTASSIUM 25 MG PO TABS
ORAL_TABLET | ORAL | 3 refills | Status: DC
Start: 2021-04-08 — End: 2022-06-09

## 2021-04-08 MED ORDER — FENOFIBRATE 160 MG PO TABS: 160.0000 mg | ORAL_TABLET | Freq: Every day | ORAL | 3 refills | Status: DC

## 2021-04-08 NOTE — Progress Notes (Signed)
? ?Chief Complaint  ?Patient presents with  ? Follow-up  ?  Aortic stenosis  ? ?History of Present Illness: 61 yo male with history of CAD s/p 3V CABG in 2009, bicuspid aortic valve status post bioprosthetic aortic valve replacement in 2009, HTN, hyperlipidemia and thoracic aortic aneurysm here today for cardiac follow up. He has been followed in our office by Dr. Saunders Revel. I met him for the first time in January 2020. Echo in October 2022 with moderate aortic stenosis (mean gradient 35 mmHg). LV systolic function was normal. Thoracic MRA December 2022 with stable dilation of the ascending aorta at 4.5 cm.  ? ?He is here today for follow up. The patient denies any chest pain, palpitations, lower extremity edema, orthopnea, PND, dizziness, near syncope or syncope. He has noticed more dyspnea over the past few months. He is dyspneic now with moderate activity but has continued to exercise.  ? ?Primary Care Physician: Velna Hatchet, MD ? ?Past Medical History:  ?Diagnosis Date  ? Aortic stenosis   ? Arthritis   ? hands  ? Complex tear of lateral meniscus of left knee as current injury   ? Complex tear of medial meniscus of left knee   ? Coronary artery disease   ? GERD (gastroesophageal reflux disease)   ? Heart murmur   ? Hypertension   ? Unspecified  ? Migraine headache   ? Mixed hyperlipidemia   ? Thoracic aortic aneurysm ([place])   ? 4.4 cm ascending aorta (06/2016)  ? ? ?Past Surgical History:  ?Procedure Laterality Date  ? AORTIC VALVE REPLACEMENT  01/25/2007  ? 25 mm [facility] pericardial tissue valve  ? COLONOSCOPY WITH PROPOFOL N/A 05/10/2016  ? Procedure: COLONOSCOPY WITH PROPOFOL;  Surgeon: Garlan Fair, MD;  Location: [facility];  Service: Endoscopy;  Laterality: N/A;  ? colonscopy    ? CORONARY ARTERY BYPASS GRAFT  01/2007  ? x3 (LIMA->LAD, SVG->D, SVG->PDA)  ? KNEE ARTHROSCOPY WITH MEDIAL MENISECTOMY Left 06/25/2019  ? Procedure: KNEE ARTHROSCOPY WITH MEDIAL AND LATERAL MENISECTOMY;  Surgeon: Elsie Saas, MD;  Location: [place];  Service: Orthopedics;  Laterality: Left;  ? [place]  ? ? ?Current Outpatient Medications  ?Medication Sig Dispense Refill  ? amoxicillin (AMOXIL) 500 MG capsule Take four (4) capsules by mouth as needed one (1) hour prior to dental procedures.  0  ? aspirin EC 81 MG tablet Take 1 tablet (81 mg total) by mouth daily. 90 tablet 3  ? doxepin (SINEQUAN) 75 MG capsule take 50ms once daily at bedtime  11  ? mometasone (ELOCON) 0.1 % cream APPLY TOPICALLY TO THE AFFECTED AREA TWICE DAILY AS NEEDED    ? PRESCRIPTION MEDICATION Apply 1 application topically daily as needed (psoriasis). Steroid cream    ? Tetrahydrozoline HCl (VISINE OP) Apply 1 drop to eye daily as needed (redness/dry eyes).    ? atorvastatin (LIPITOR) 80 MG tablet Take 1 tablet (80 mg total) by mouth daily. 90 tablet 3  ? carvedilol (COREG) 25 MG tablet TAKE 1 TABLET BY MOUTH TWICE DAILY 180 tablet 3  ? fenofibrate 160 MG tablet Take 1 tablet (160 mg total) by mouth daily. 90 tablet 3  ? losartan (COZAAR) 25 MG tablet TAKE 1 TABLET(25 MG) BY MOUTH DAILY 90 tablet 3  ? ?No current facility-administered medications for this visit.  ? ? ?No Known Allergies ? ?Social History  ? ?Socioeconomic History  ? Marital status: Married  ?  Spouse name: Not  on file  ? Number of children: Not on file  ? Years of education: Not on file  ? Highest education level: Not on file  ?Occupational History  ? Occupation: Full time  ?Tobacco Use  ? Smoking status: Never  ? Smokeless tobacco: Never  ? Tobacco comments:  ?  Tobacco use-no  ?Substance and Sexual Activity  ? Alcohol use: Yes  ?  Comment: Occasionally  ? Drug use: Yes  ?  Types: Marijuana  ?  Comment: marijuana use a few weeks ago  ? Sexual activity: Not on file  ?Other Topics Concern  ? Not on file  ?Social History Narrative  ? Not on file  ? ?Social Determinants of Health  ? ?Financial Resource Strain: Not on file  ?Food Insecurity: Not on file   ?Transportation Needs: Not on file  ?Physical Activity: Not on file  ?Stress: Not on file  ?Social Connections: Not on file  ?Intimate Partner Violence: Not on file  ? ? ?Family History  ?Problem Relation Age of Onset  ? Alzheimer's disease Father   ? Coronary artery disease Other   ? Cancer Other 91  ? Other Sister 70  ?     brain tumor  ? ? ?Review of Systems:  As stated in the HPI and otherwise negative.  ? ?BP 116/80   Pulse 87   Ht 6' (1.829 m)   Wt 181 lb 6.4 oz (82.3 kg)   SpO2 98%   BMI 24.60 kg/m?  ? ?Physical Examination: ?General: Well developed, well nourished, NAD  ?HEENT: OP clear, mucus membranes moist  ?SKIN: warm, dry. No rashes. ?Neuro: No focal deficits  ?Musculoskeletal: Muscle strength 5/5 all ext  ?Psychiatric: Mood and affect normal  ?Neck: No JVD, no carotid bruits, no thyromegaly, no lymphadenopathy.  ?Lungs:Clear bilaterally, no wheezes, rhonci, crackles ?Cardiovascular: Regular rate and rhythm. Loud, harsh systolic murmur.  ?Abdomen:Soft. Bowel sounds present. Non-tender.  ?Extremities: No lower extremity edema. Pulses are 2 + in the bilateral DP/PT. ? ?EKG:  EKG is ordered today. ?The ekg ordered today demonstrates sinus, chronic T wave inversions inferior and anterolateral leads.  ? ?Echo October 2022: ? 1. Left ventricular ejection fraction by 3D volume is 61 %. The left  ?ventricle has normal function. The left ventricle has no regional wall  ?motion abnormalities. Left ventricular diastolic parameters are consistent  ?with Grade II diastolic dysfunction  ?(pseudonormalization). The average left ventricular global longitudinal  ?strain is -22.6 %. The global longitudinal strain is normal.  ? 2. Right ventricular systolic function is normal. The right ventricular  ?size is mildly enlarged.  ? 3. The mitral valve is normal in structure. No evidence of mitral valve  ?regurgitation. No evidence of mitral stenosis.  ? 4. The aortic valve has been repaired/replaced. There is moderate   ?calcification of the aortic valve. There is moderate thickening of the  ?aortic valve. Aortic valve regurgitation is trivial. Moderate to severe  ?aortic valve stenosis. There is a valve  ?present in the aortic position. Procedure Date: 2009. Aortic valve area,  ?by VTI measures 0.72 cm?Marland Kitchen Aortic valve mean gradient measures 35.4 mmHg.  ?Aortic valve Vmax measures 3.76 m/s.  ? 5. Aortic dilatation noted. There is moderate dilatation of the ascending  ?aorta, measuring 46 mm.  ? ?Recent Labs: ?04/13/2020: BUN 24; Creatinine, Ser 1.26; Potassium 4.3; Sodium 142  ? ?Lipid Panel ?   ?Component Value Date/Time  ? CHOL 114 03/14/2017 0811  ? TRIG 151 (H) 03/14/2017 1115  ?  HDL 36 (L) 03/14/2017 0811  ? CHOLHDL 3.2 03/14/2017 0811  ? CHOLHDL 4.7 09/24/2015 0806  ? VLDL 71 (H) 09/24/2015 0806  ? LDLCALC 48 03/14/2017 0811  ? LDLDIRECT 52.0 06/17/2014 1505  ? ?  ?Wt Readings from Last 3 Encounters:  ?04/08/21 181 lb 6.4 oz (82.3 kg)  ?04/06/20 189 lb 10.1 oz (86 kg)  ?10/14/19 182 lb 12.8 oz (82.9 kg)  ?  ? ?Other studies Reviewed: ?Additional studies/ records that were reviewed today include:  ?Review of the above records demonstrates:  ? ? ?Assessment and Plan:  ? ?1. Aortic stenosis s/p bioprosthetic AVR: He is s/p bioprosthetic aortic valve replacement in 2009. Most recent echo in October 2022 with moderate stenosis of the bioprosthetic aortic valve. He has been having more dyspnea. I suspect his valve disease has progressed. Will repeat echo now.  ? ?2. CAD s/p CABG: He has no chest pain. Continue ASA, statin and beta blocker.  ? ?3. HTN: BP is controlled. No changes.  ? ?4. Hyperlipidemia: LDL 50 in March 2023. Lipids followed in primary care. Continue statin ? ?5. Thoracic aortic aneurysm: Stable at 4.5 cm by chest MRA December 2022. Repeat December 2023.  ? ?Current medicines are reviewed at length with the patient today.  The patient does not have concerns regarding medicines. ? ?The following changes have been  made:  no change ? ?Labs/ tests ordered today include:  ? ?Orders Placed This Encounter  ?Procedures  ? EKG 12-Lead  ? ECHOCARDIOGRAM COMPLETE  ? ?Disposition:   F/U with me in 4-6 weeks.  ? ?Signed, ?Sharon Mt

## 2021-04-08 NOTE — Patient Instructions (Addendum)
Medication Instructions:  ?Your physician recommends that you continue on your current medications as directed. Please refer to the Current Medication list given to you today. ? ?*If you need a refill on your cardiac medications before your next appointment, please call your pharmacy* ? ? ?Lab Work: ?None ordered  ? ?If you have labs (blood work) drawn today and your tests are completely normal, you will receive your results only by: ?MyChart Message (if you have MyChart) OR ?A paper copy in the mail ?If you have any lab test that is abnormal or we need to change your treatment, we will call you to review the results. ? ? ?Testing/Procedures: ?Your physician has requested that you have an echocardiogram. Echocardiography is a painless test that uses sound waves to create images of your heart. It provides your doctor with information about the size and shape of your heart and how well your heart?s chambers and valves are working. This procedure takes approximately one hour. There are no restrictions for this procedure. ? ? ? ?Follow-Up: ?At [facility], you and your health needs are our priority.  As part of our continuing mission to provide you with exceptional heart care, we have created designated Provider Care Teams.  These Care Teams include your primary Cardiologist (physician) and Advanced Practice Providers (APPs -  Physician Assistants and Nurse Practitioners) who all work together to provide you with the care you need, when you need it. ? ? ?Your next appointment:   ?2-4week(s) ? ?The format for your next appointment:   ?In Person ? ?Provider:   ?Verne Carrow, MD   ? ? ?Other Instructions ?None  ?

## 2021-04-09 ENCOUNTER — Encounter: Payer: Self-pay | Admitting: Cardiovascular Disease

## 2021-04-12 NOTE — Telephone Encounter (Signed)
Labs from patient's PCP printed and placed to have scanned into chart. ?

## 2021-04-19 ENCOUNTER — Ambulatory Visit (HOSPITAL_COMMUNITY): Payer: BC Managed Care – PPO | Attending: Cardiology

## 2021-04-19 DIAGNOSIS — Z952 Presence of prosthetic heart valve: Secondary | ICD-10-CM

## 2021-04-19 DIAGNOSIS — Q231 Congenital insufficiency of aortic valve: Secondary | ICD-10-CM | POA: Diagnosis not present

## 2021-04-19 LAB — ECHOCARDIOGRAM COMPLETE
AR max vel: 0.77 cm2
AV Area VTI: 0.87 cm2
AV Area mean vel: 0.78 cm2
AV Mean grad: 30.7 mmHg
AV Peak grad: 53.5 mmHg
Ao pk vel: 3.66 m/s
Area-P 1/2: 3.34 cm2
S' Lateral: 2.5 cm

## 2021-04-22 DIAGNOSIS — M79645 Pain in left finger(s): Secondary | ICD-10-CM | POA: Diagnosis not present

## 2021-04-22 NOTE — Progress Notes (Signed)
? ?Chief Complaint  ?Patient presents with  ? Follow-up  ?  Aortic valve disease  ? ?History of Present Illness: 61 yo male with history of CAD s/p 3V CABG in 2009, bicuspid aortic valve status post bioprosthetic aortic valve replacement in 2009, HTN, hyperlipidemia and thoracic aortic aneurysm here today for cardiac follow up. He has been followed in our office by Dr. Saunders Revel. I met him for the first time in January 2020. Echo in October 2022 with moderate aortic stenosis (mean gradient 35 mmHg). LV systolic function was normal. Thoracic MRA December 2022 with stable dilation of the ascending aorta at 4.5 cm. I saw him in the office 04/08/21 and he reported dyspnea with moderate exercise. Echo 04/19/21 with LVEF=70-75%. The bioprosthetic AVR is not functioning normally but no change from October. Mean gradient 30.7 mmHg.  ? ?He is here today for follow up. The patient denies any chest pain, palpitations, lower extremity edema, orthopnea, PND, dizziness, near syncope or syncope. He is very active and tells me today that he has minimal dyspnea with exertion. He works hard in his harden and around the yard and can work for hours with no dyspnea. He notices some dyspnea when climbing a hill.  ? ?Primary Care Physician: Velna Hatchet, MD ? ?Past Medical History:  ?Diagnosis Date  ? Aortic stenosis   ? Arthritis   ? hands  ? Complex tear of lateral meniscus of left knee as current injury   ? Complex tear of medial meniscus of left knee   ? Coronary artery disease   ? GERD (gastroesophageal reflux disease)   ? Heart murmur   ? Hypertension   ? Unspecified  ? Migraine headache   ? Mixed hyperlipidemia   ? Thoracic aortic aneurysm ([place])   ? 4.4 cm ascending aorta (06/2016)  ? ? ?Past Surgical History:  ?Procedure Laterality Date  ? AORTIC VALVE REPLACEMENT  01/25/2007  ? 25 mm [facility] pericardial tissue valve  ? COLONOSCOPY WITH PROPOFOL N/A 05/10/2016  ? Procedure: COLONOSCOPY WITH PROPOFOL;  Surgeon: Garlan Fair, MD;   Location: [facility];  Service: Endoscopy;  Laterality: N/A;  ? colonscopy    ? CORONARY ARTERY BYPASS GRAFT  01/2007  ? x3 (LIMA->LAD, SVG->D, SVG->PDA)  ? KNEE ARTHROSCOPY WITH MEDIAL MENISECTOMY Left 06/25/2019  ? Procedure: KNEE ARTHROSCOPY WITH MEDIAL AND LATERAL MENISECTOMY;  Surgeon: Elsie Saas, MD;  Location: [place];  Service: Orthopedics;  Laterality: Left;  ? [place]  ? ? ?Current Outpatient Medications  ?Medication Sig Dispense Refill  ? amoxicillin (AMOXIL) 500 MG capsule Take four (4) capsules by mouth as needed one (1) hour prior to dental procedures.  0  ? aspirin EC 81 MG tablet Take 1 tablet (81 mg total) by mouth daily. 90 tablet 3  ? atorvastatin (LIPITOR) 80 MG tablet Take 1 tablet (80 mg total) by mouth daily. 90 tablet 3  ? carvedilol (COREG) 25 MG tablet TAKE 1 TABLET BY MOUTH TWICE DAILY 180 tablet 3  ? doxepin (SINEQUAN) 75 MG capsule take 68ms once daily at bedtime  11  ? fenofibrate 160 MG tablet Take 1 tablet (160 mg total) by mouth daily. 90 tablet 3  ? losartan (COZAAR) 25 MG tablet TAKE 1 TABLET(25 MG) BY MOUTH DAILY 90 tablet 3  ? mometasone (ELOCON) 0.1 % cream APPLY TOPICALLY TO THE AFFECTED AREA TWICE DAILY AS NEEDED    ? PRESCRIPTION MEDICATION Apply 1 application topically daily as needed (psoriasis). Steroid  cream    ? Tetrahydrozoline HCl (VISINE OP) Apply 1 drop to eye daily as needed (redness/dry eyes).    ? ?No current facility-administered medications for this visit.  ? ? ?No Known Allergies ? ?Social History  ? ?Socioeconomic History  ? Marital status: Married  ?  Spouse name: Not on file  ? Number of children: Not on file  ? Years of education: Not on file  ? Highest education level: Not on file  ?Occupational History  ? Occupation: Full time  ?Tobacco Use  ? Smoking status: Never  ? Smokeless tobacco: Never  ? Tobacco comments:  ?  Tobacco use-no  ?Substance and Sexual Activity  ? Alcohol use: Yes  ?  Comment: Occasionally   ? Drug use: Yes  ?  Types: Marijuana  ?  Comment: marijuana use a few weeks ago  ? Sexual activity: Not on file  ?Other Topics Concern  ? Not on file  ?Social History Narrative  ? Not on file  ? ?Social Determinants of Health  ? ?Financial Resource Strain: Not on file  ?Food Insecurity: Not on file  ?Transportation Needs: Not on file  ?Physical Activity: Not on file  ?Stress: Not on file  ?Social Connections: Not on file  ?Intimate Partner Violence: Not on file  ? ? ?Family History  ?Problem Relation Age of Onset  ? Alzheimer's disease Father   ? Coronary artery disease Other   ? Cancer Other 91  ? Other Sister 43  ?     brain tumor  ? ? ?Review of Systems:  As stated in the HPI and otherwise negative.  ? ?BP 136/70   Pulse (!) 57   Ht 6' (1.829 m)   Wt 184 lb (83.5 kg)   SpO2 98%   BMI 24.95 kg/m?  ? ?Physical Examination: ?General: Well developed, well nourished, NAD  ?HEENT: OP clear, mucus membranes moist  ?SKIN: warm, dry. No rashes. ?Neuro: No focal deficits  ?Musculoskeletal: Muscle strength 5/5 all ext  ?Psychiatric: Mood and affect normal  ?Neck: No JVD, no carotid bruits, no thyromegaly, no lymphadenopathy.  ?Lungs:Clear bilaterally, no wheezes, rhonci, crackles ?Cardiovascular: Regular rate and rhythm. Harsh systolic murmur.  ?Abdomen:Soft. Bowel sounds present. Non-tender.  ?Extremities: No lower extremity edema. Pulses are 2 + in the bilateral DP/PT. ? ?EKG:  EKG is not ordered today. ?The ekg ordered today demonstrates  ? ?Echo 04/19/21: ? 1. Left ventricular ejection fraction, by estimation, is 70 to 75%. Left  ?ventricular ejection fraction by 3D volume is 71 %. The left ventricle has  ?hyperdynamic function. The left ventricle has no regional wall motion  ?abnormalities. There is mild left  ?ventricular hypertrophy of the basal-septal segment. Left ventricular  ?diastolic parameters are indeterminate. Elevated left ventricular  ?end-diastolic pressure. The average left ventricular global  longitudinal  ?strain is 20.3 %. The global longitudinal  ?strain is normal.  ? 2. Right ventricular systolic function is normal. The right ventricular  ?size is mildly enlarged. There is normal pulmonary artery systolic  ?pressure. The estimated right ventricular systolic pressure is 79.3 mmHg.  ? 3. Right atrial size was mildly dilated.  ? 4. The mitral valve is normal in structure. Trivial mitral valve  ?regurgitation. No evidence of mitral stenosis.  ? 5. The aortic valve has been repaired/replaced. There is severe  ?calcifcation of the aortic valve. There is severe thickening of the aortic  ?valve. Aortic valve regurgitation is trivial. Moderate to severe aortic  ?valve stenosis. Aortic valve area,  by VTI  ?measures 0.87 cm?Marland Kitchen Aortic valve mean gradient measures 30.7 mmHg. Aortic  ?valve Vmax measures 3.66 m/s.  ? 6. Aortic dilatation noted. Aneurysm of the ascending aorta, measuring 46  ?mm.  ? 7. The inferior vena cava is normal in size with greater than 50%  ?respiratory variability, suggesting right atrial pressure of 3 mmHg.  ? 8. Compared to study dated 11/06/2020, there is no significant change  ? ?Recent Labs: ?No results found for requested labs within last 8760 hours.  ? ?Lipid Panel ?   ?Component Value Date/Time  ? CHOL 114 03/14/2017 0811  ? TRIG 151 (H) 03/14/2017 2023  ? HDL 36 (L) 03/14/2017 0811  ? CHOLHDL 3.2 03/14/2017 0811  ? CHOLHDL 4.7 09/24/2015 0806  ? VLDL 71 (H) 09/24/2015 0806  ? LDLCALC 48 03/14/2017 0811  ? LDLDIRECT 52.0 06/17/2014 1505  ? ?  ?Wt Readings from Last 3 Encounters:  ?04/23/21 184 lb (83.5 kg)  ?04/08/21 181 lb 6.4 oz (82.3 kg)  ?04/06/20 189 lb 10.1 oz (86 kg)  ?  ? ?Other studies Reviewed: ?Additional studies/ records that were reviewed today include:  ?Review of the above records demonstrates:  ? ? ?Assessment and Plan:  ? ?1. Aortic stenosis s/p bioprosthetic AVR: He is s/p bioprosthetic aortic valve replacement in 2009. Most recent echo in April 2023 with moderate  stenosis of the bioprosthetic aortic valve. He has minimal symptoms at this time. I have reviewed the symptoms that he may develop. Will repeat echo in 6 months. If he has a change in symptoms he will call back s

## 2021-04-23 ENCOUNTER — Ambulatory Visit: Payer: BC Managed Care – PPO | Admitting: Cardiovascular Disease

## 2021-04-23 ENCOUNTER — Encounter: Payer: Self-pay | Admitting: Cardiovascular Disease

## 2021-04-23 VITALS — BP 136/70 | HR 57 | Ht 72.0 in | Wt 184.0 lb

## 2021-04-23 DIAGNOSIS — I25810 Atherosclerosis of coronary artery bypass graft(s) without angina pectoris: Secondary | ICD-10-CM

## 2021-04-23 DIAGNOSIS — E785 Hyperlipidemia, unspecified: Secondary | ICD-10-CM

## 2021-04-23 DIAGNOSIS — I712 Thoracic aortic aneurysm, without rupture, unspecified: Secondary | ICD-10-CM

## 2021-04-23 DIAGNOSIS — I350 Nonrheumatic aortic (valve) stenosis: Secondary | ICD-10-CM

## 2021-04-23 DIAGNOSIS — I10 Essential (primary) hypertension: Secondary | ICD-10-CM

## 2021-04-23 NOTE — Patient Instructions (Signed)
Medication Instructions:  ?Your physician recommends that you continue on your current medications as directed. Please refer to the Current Medication list given to you today. ? ?*If you need a refill on your cardiac medications before your next appointment, please call your pharmacy* ? ? ?Lab Work: ?None ?If you have labs (blood work) drawn today and your tests are completely normal, you will receive your results only by: ?MyChart Message (if you have MyChart) OR ?A paper copy in the mail ?If you have any lab test that is abnormal or we need to change your treatment, we will call you to review the results. ? ? ?Testing/Procedures: ?Your physician has requested that you have an echocardiogram 1-2 weeks prior to seeing Dr. Clifton James back. Echocardiography is a painless test that uses sound waves to create images of your heart. It provides your doctor with information about the size and shape of your heart and how well your heart?s chambers and valves are working. This procedure takes approximately one hour. There are no restrictions for this procedure. ? ? ? ?Follow-Up: ?At [facility], you and your health needs are our priority.  As part of our continuing mission to provide you with exceptional heart care, we have created designated Provider Care Teams.  These Care Teams include your primary Cardiologist (physician) and Advanced Practice Providers (APPs -  Physician Assistants and Nurse Practitioners) who all work together to provide you with the care you need, when you need it. ? ?We recommend signing up for the patient portal called "MyChart".  Sign up information is provided on this After Visit Summary.  MyChart is used to connect with patients for Virtual Visits (Telemedicine).  Patients are able to view lab/test results, encounter notes, upcoming appointments, etc.  Non-urgent messages can be sent to your provider as well.   ?To learn more about what you can do with MyChart, go to [email or web address].    ? ?Your next appointment:   ?6 month(s) ? ?The format for your next appointment:   ?In Person ? ?Provider:   ?Verne Carrow, MD  ? ? ?Other Instructions ?  ?

## 2021-05-03 DIAGNOSIS — M19072 Primary osteoarthritis, left ankle and foot: Secondary | ICD-10-CM | POA: Diagnosis not present

## 2021-05-03 DIAGNOSIS — M79671 Pain in right foot: Secondary | ICD-10-CM | POA: Diagnosis not present

## 2021-05-03 DIAGNOSIS — M25572 Pain in left ankle and joints of left foot: Secondary | ICD-10-CM | POA: Diagnosis not present

## 2021-05-03 DIAGNOSIS — M79672 Pain in left foot: Secondary | ICD-10-CM | POA: Diagnosis not present

## 2021-05-04 DIAGNOSIS — H2513 Age-related nuclear cataract, bilateral: Secondary | ICD-10-CM | POA: Diagnosis not present

## 2021-05-12 DIAGNOSIS — M25572 Pain in left ankle and joints of left foot: Secondary | ICD-10-CM | POA: Diagnosis not present

## 2021-06-02 DIAGNOSIS — M2041 Other hammer toe(s) (acquired), right foot: Secondary | ICD-10-CM | POA: Diagnosis not present

## 2021-06-02 DIAGNOSIS — M19072 Primary osteoarthritis, left ankle and foot: Secondary | ICD-10-CM | POA: Diagnosis not present

## 2021-06-22 ENCOUNTER — Encounter: Payer: Self-pay | Admitting: Cardiovascular Disease

## 2021-06-24 DIAGNOSIS — I7121 Aneurysm of the ascending aorta, without rupture: Secondary | ICD-10-CM | POA: Diagnosis not present

## 2021-06-24 DIAGNOSIS — Z952 Presence of prosthetic heart valve: Secondary | ICD-10-CM | POA: Diagnosis not present

## 2021-06-24 DIAGNOSIS — R9431 Abnormal electrocardiogram [ECG] [EKG]: Secondary | ICD-10-CM | POA: Diagnosis not present

## 2021-06-24 DIAGNOSIS — Z951 Presence of aortocoronary bypass graft: Secondary | ICD-10-CM | POA: Diagnosis not present

## 2021-06-24 DIAGNOSIS — Q231 Congenital insufficiency of aortic valve: Secondary | ICD-10-CM | POA: Diagnosis not present

## 2021-06-24 DIAGNOSIS — I25810 Atherosclerosis of coronary artery bypass graft(s) without angina pectoris: Secondary | ICD-10-CM | POA: Diagnosis not present

## 2021-06-24 DIAGNOSIS — I440 Atrioventricular block, first degree: Secondary | ICD-10-CM | POA: Diagnosis not present

## 2021-06-24 DIAGNOSIS — R0600 Dyspnea, unspecified: Secondary | ICD-10-CM | POA: Diagnosis not present

## 2021-06-24 DIAGNOSIS — I350 Nonrheumatic aortic (valve) stenosis: Secondary | ICD-10-CM | POA: Diagnosis not present

## 2021-06-24 DIAGNOSIS — I712 Thoracic aortic aneurysm, without rupture, unspecified: Secondary | ICD-10-CM | POA: Diagnosis not present

## 2021-06-24 DIAGNOSIS — T82857A Stenosis of cardiac prosthetic devices, implants and grafts, initial encounter: Secondary | ICD-10-CM | POA: Diagnosis not present

## 2021-06-24 DIAGNOSIS — I10 Essential (primary) hypertension: Secondary | ICD-10-CM | POA: Diagnosis not present

## 2021-06-24 DIAGNOSIS — E785 Hyperlipidemia, unspecified: Secondary | ICD-10-CM | POA: Diagnosis not present

## 2021-06-28 DIAGNOSIS — M19072 Primary osteoarthritis, left ankle and foot: Secondary | ICD-10-CM | POA: Diagnosis not present

## 2021-06-29 ENCOUNTER — Encounter: Payer: Self-pay | Admitting: Cardiovascular Disease

## 2021-07-21 DIAGNOSIS — Z7982 Long term (current) use of aspirin: Secondary | ICD-10-CM | POA: Diagnosis not present

## 2021-07-21 DIAGNOSIS — R0600 Dyspnea, unspecified: Secondary | ICD-10-CM | POA: Diagnosis not present

## 2021-07-21 DIAGNOSIS — R0602 Shortness of breath: Secondary | ICD-10-CM | POA: Diagnosis not present

## 2021-07-21 DIAGNOSIS — I10 Essential (primary) hypertension: Secondary | ICD-10-CM | POA: Diagnosis not present

## 2021-07-21 DIAGNOSIS — Z79899 Other long term (current) drug therapy: Secondary | ICD-10-CM | POA: Diagnosis not present

## 2021-07-22 DIAGNOSIS — R0602 Shortness of breath: Secondary | ICD-10-CM | POA: Diagnosis not present

## 2021-07-22 DIAGNOSIS — R0600 Dyspnea, unspecified: Secondary | ICD-10-CM | POA: Diagnosis not present

## 2021-07-22 DIAGNOSIS — Z79899 Other long term (current) drug therapy: Secondary | ICD-10-CM | POA: Diagnosis not present

## 2021-07-22 DIAGNOSIS — I10 Essential (primary) hypertension: Secondary | ICD-10-CM | POA: Diagnosis not present

## 2021-07-22 DIAGNOSIS — Z7982 Long term (current) use of aspirin: Secondary | ICD-10-CM | POA: Diagnosis not present

## 2021-07-29 NOTE — Progress Notes (Addendum)
Chief Complaint  Patient presents with   Follow-up    Aortic stenosis    History of Present Illness: 61 yo male with history of CAD s/p 3V CABG in 2009, bicuspid aortic valve status post bioprosthetic aortic valve replacement in 2009, HTN, hyperlipidemia and thoracic aortic aneurysm here today for cardiac follow up. He has been followed in our office by Dr. Saunders Revel. I met him for the first time in January 2020. Echo in October 2022 with moderate aortic stenosis (mean gradient 35 mmHg). LV systolic function was normal. He was feeling well at that time so we continued to follow his AV disease. Thoracic MRA December 2022 with stable dilation of the ascending aorta at 4.5 cm. I saw him in the office 04/08/21 and he reported dyspnea with moderate exercise. Echo 04/19/21 with LVEF=70-75%. The bioprosthetic AVR is not functioning normally but no change from October. Mean gradient 30.7 mmHg on our echo. He reported brisk physical activity with no symptoms so we elected to continue following his aortic valve disease closely. He sought a second opinion at the [facility] in June 2023. Echo 06/24/21 at the [facility] with finding of severe bioprosthetic AV stenosis with mean gradient 54 mmHg, peak gradient 87 mmHg, DI 0.27. LVEF=65%.   He is here today for follow up. The patient denies any chest pain, dyspnea, palpitations, lower extremity edema, orthopnea, PND, dizziness, near syncope or syncope.   Primary Care Physician: Velna Hatchet, MD  Past Medical History:  Diagnosis Date   Aortic stenosis    Arthritis    hands   Complex tear of lateral meniscus of left knee as current injury    Complex tear of medial meniscus of left knee    Coronary artery disease    GERD (gastroesophageal reflux disease)    Heart murmur    Hypertension    Unspecified   Migraine headache    Mixed hyperlipidemia    Thoracic aortic aneurysm (HCC)    4.4 cm ascending aorta (06/2016)    Past Surgical History:   Procedure Laterality Date   AORTIC VALVE REPLACEMENT  01/25/2007   25 mm Edwards Magna pericardial tissue valve   COLONOSCOPY WITH PROPOFOL N/A 05/10/2016   Procedure: COLONOSCOPY WITH PROPOFOL;  Surgeon: Garlan Fair, MD;  Location: [facility];  Service: Endoscopy;  Laterality: N/A;   colonscopy     CORONARY ARTERY BYPASS GRAFT  01/2007   x3 (LIMA->LAD, SVG->D, SVG->PDA)   KNEE ARTHROSCOPY WITH MEDIAL MENISECTOMY Left 06/25/2019   Procedure: KNEE ARTHROSCOPY WITH MEDIAL AND LATERAL MENISECTOMY;  Surgeon: Elsie Saas, MD;  Location: [place];  Service: Orthopedics;  Laterality: Left;   WISDOM TOOTH EXTRACTION  1980    Current Outpatient Medications  Medication Sig Dispense Refill   amoxicillin (AMOXIL) 500 MG capsule Take four (4) capsules by mouth as needed one (1) hour prior to dental procedures.  0   aspirin EC 81 MG tablet Take 1 tablet (81 mg total) by mouth daily. 90 tablet 3   atorvastatin (LIPITOR) 80 MG tablet Take 1 tablet (80 mg total) by mouth daily. 90 tablet 3   carvedilol (COREG) 25 MG tablet TAKE 1 TABLET BY MOUTH TWICE DAILY 180 tablet 3   doxepin (SINEQUAN) 75 MG capsule take $RemoveBefo'75mg'[id number]$ s once daily at bedtime  11   fenofibrate 160 MG tablet Take 1 tablet (160 mg total) by mouth daily. 90 tablet 3   losartan (COZAAR) 25 MG tablet TAKE 1 TABLET(25 MG) BY MOUTH DAILY 90  tablet 3   mometasone (ELOCON) 0.1 % cream APPLY TOPICALLY TO THE AFFECTED AREA TWICE DAILY AS NEEDED     PRESCRIPTION MEDICATION Apply 1 application topically daily as needed (psoriasis). Steroid cream     Tetrahydrozoline HCl (VISINE OP) Apply 1 drop to eye daily as needed (redness/dry eyes).     No current facility-administered medications for this visit.    No Known Allergies  Social History   Socioeconomic History   Marital status: Married    Spouse name: Not on file   Number of children: Not on file   Years of education: Not on file   Highest education level: Not on file   Occupational History   Occupation: Full time  Tobacco Use   Smoking status: Never   Smokeless tobacco: Never   Tobacco comments:    Tobacco use-no  Substance and Sexual Activity   Alcohol use: Yes    Comment: Occasionally   Drug use: Yes    Types: Marijuana    Comment: marijuana use a few weeks ago   Sexual activity: Not on file  Other Topics Concern   Not on file  Social History Narrative   Not on file   Social Determinants of Health   Financial Resource Strain: Not on file  Food Insecurity: Not on file  Transportation Needs: Not on file  Physical Activity: Not on file  Stress: Not on file  Social Connections: Not on file  Intimate Partner Violence: Not on file    Family History  Problem Relation Age of Onset   Alzheimer's disease Father    Coronary artery disease Other    Cancer Other 87   Other Sister 26       brain tumor    Review of Systems:  As stated in the HPI and otherwise negative.   BP 130/84   Pulse 61   Ht 6' (1.829 m)   Wt 173 lb 3.2 oz (78.6 kg)   SpO2 100%   BMI 23.49 kg/m   Physical Examination:  General: Well developed, well nourished, NAD  HEENT: OP clear, mucus membranes moist  SKIN: warm, dry. No rashes. Neuro: No focal deficits  Musculoskeletal: Muscle strength 5/5 all ext  Psychiatric: Mood and affect normal  Neck: No JVD, no carotid bruits, no thyromegaly, no lymphadenopathy.  Lungs:Clear bilaterally, no wheezes, rhonci, crackles Cardiovascular: Regular rate and rhythm. No murmurs, gallops or rubs. Abdomen:Soft. Bowel sounds present. Non-tender.  Extremities: No lower extremity edema. Pulses are 2 + in the bilateral DP/PT.  EKG:  EKG is ordered today. The ekg ordered today demonstrates Sinus, 1st degree AV block. LVH  Echo 04/19/21:  1. Left ventricular ejection fraction, by estimation, is 70 to 75%. Left  ventricular ejection fraction by 3D volume is 71 %. The left ventricle has  hyperdynamic function. The left ventricle  has no regional wall motion  abnormalities. There is mild left  ventricular hypertrophy of the basal-septal segment. Left ventricular  diastolic parameters are indeterminate. Elevated left ventricular  end-diastolic pressure. The average left ventricular global longitudinal  strain is 20.3 %. The global longitudinal  strain is normal.   2. Right ventricular systolic function is normal. The right ventricular  size is mildly enlarged. There is normal pulmonary artery systolic  pressure. The estimated right ventricular systolic pressure is 00.7 mmHg.   3. Right atrial size was mildly dilated.   4. The mitral valve is normal in structure. Trivial mitral valve  regurgitation. No evidence of mitral stenosis.  5. The aortic valve has been repaired/replaced. There is severe  calcifcation of the aortic valve. There is severe thickening of the aortic  valve. Aortic valve regurgitation is trivial. Moderate to severe aortic  valve stenosis. Aortic valve area, by VTI  measures 0.87 cm. Aortic valve mean gradient measures 30.7 mmHg. Aortic  valve Vmax measures 3.66 m/s.   6. Aortic dilatation noted. Aneurysm of the ascending aorta, measuring 46  mm.   7. The inferior vena cava is normal in size with greater than 50%  respiratory variability, suggesting right atrial pressure of 3 mmHg.   8. Compared to study dated 11/06/2020, there is no significant change   Echo 06/24/21 at [facility]:   ECHO Collected: 06/24/2021 12:34 PM (Final result)  Impression: CONCLUSIONS: - Exam indication: Re-evaluation of known prosthetic valve dysfunction - The left ventricle is normal in size. There is mild left ventricular  hypertrophy. Left ventricular systolic function is normal. EF = 64  5% (2D  biplane) - The right ventricle is normal in size. Right ventricular systolic function is  normal. - The left atrial cavity is dilated. - The visualized aorta is dilated with a maximal dimension of 4.6 cm. -  Bioprosthetic prosthetic aortic valve. There is severe aortic valve stenosis  caused by prosthetic thickening/calcification. The peak gradient is 87 mmHg, the  mean gradient is 54 mmHg and the dimensionless valve index is 0.27. There is trace aortic valve regurgitation. - The patient has not had a prior CC echocardiographic exam for comparison.  Recent Labs: No results found for requested labs within last 365 days.   Lipid Panel    Component Value Date/Time   CHOL 114 03/14/2017 0811   TRIG 151 (H) 03/14/2017 0811   HDL 36 (L) 03/14/2017 0811   CHOLHDL 3.2 03/14/2017 0811   CHOLHDL 4.7 09/24/2015 0806   VLDL 71 (H) 09/24/2015 0806   LDLCALC 48 03/14/2017 0811   LDLDIRECT 52.0 06/17/2014 1505     Wt Readings from Last 3 Encounters:  07/30/21 173 lb 3.2 oz (78.6 kg)  04/23/21 184 lb (83.5 kg)  04/08/21 181 lb 6.4 oz (82.3 kg)     Assessment and Plan:   1. Aortic stenosis s/p bioprosthetic AVR: He is s/p bioprosthetic aortic valve replacement in 2009. Most recent echo in April 2023 in our system with moderate stenosis of the bioprosthetic aortic valve with mean gradient around 31 mmHg. Echo at [facility] June 2023 with mean gradient of 54 mmHg suggesting severe stenosis of the bioprosthetic AVR. He is planning to have aortic root replacement and AVR at [facility] next month.   2. CAD s/p CABG: No chest pain. Continue ASA, statin and beta blocker.   3. HTN: BP is controlled. Continue current therapy  4. Hyperlipidemia: LDL 50 in March 2023. Lipids followed in primary care. Continue statin  5. Thoracic aortic aneurysm: Stable at 4.5 cm by chest MRA December 2022. Plans for aortic root replacement at [facility] next month.   Current medicines are reviewed at length with the patient today.  The patient does not have concerns regarding medicines.  The following changes have been made:  no change  Labs/ tests ordered today include:   Orders Placed This Encounter   Procedures   EKG 12-Lead   Disposition:   F/U with me in 2-3 months  Signed, Lauree Chandler, MD 07/30/2021 11:43 AM    [facility] [place], [place], [place]  [address] Phone: [phone];  Fax: [phone]  

## 2021-07-30 ENCOUNTER — Encounter: Payer: Self-pay | Admitting: Cardiovascular Disease

## 2021-07-30 ENCOUNTER — Ambulatory Visit: Payer: BC Managed Care – PPO | Admitting: Cardiovascular Disease

## 2021-07-30 VITALS — BP 130/84 | HR 61 | Ht 72.0 in | Wt 173.2 lb

## 2021-07-30 DIAGNOSIS — I712 Thoracic aortic aneurysm, without rupture, unspecified: Secondary | ICD-10-CM | POA: Diagnosis not present

## 2021-07-30 DIAGNOSIS — I350 Nonrheumatic aortic (valve) stenosis: Secondary | ICD-10-CM

## 2021-07-30 DIAGNOSIS — I10 Essential (primary) hypertension: Secondary | ICD-10-CM | POA: Diagnosis not present

## 2021-07-30 NOTE — Patient Instructions (Signed)
Medication Instructions:  No changes *If you need a refill on your cardiac medications before your next appointment, please call your pharmacy*   Lab Work: none If you have labs (blood work) drawn today and your tests are completely normal, you will receive your results only by: MyChart Message (if you have MyChart) OR A paper copy in the mail If you have any lab test that is abnormal or we need to change your treatment, we will call you to review the results.   Testing/Procedures: none   Follow-Up: See appointment below.  Important Information About Sugar

## 2021-08-16 DIAGNOSIS — R3912 Poor urinary stream: Secondary | ICD-10-CM | POA: Diagnosis not present

## 2021-08-16 DIAGNOSIS — R3911 Hesitancy of micturition: Secondary | ICD-10-CM | POA: Diagnosis not present

## 2021-08-16 DIAGNOSIS — N401 Enlarged prostate with lower urinary tract symptoms: Secondary | ICD-10-CM | POA: Diagnosis not present

## 2021-08-16 DIAGNOSIS — R3914 Feeling of incomplete bladder emptying: Secondary | ICD-10-CM | POA: Diagnosis not present

## 2021-08-24 DIAGNOSIS — G43111 Migraine with aura, intractable, with status migrainosus: Secondary | ICD-10-CM | POA: Diagnosis not present

## 2021-08-30 DIAGNOSIS — M19072 Primary osteoarthritis, left ankle and foot: Secondary | ICD-10-CM | POA: Diagnosis not present

## 2021-09-08 DIAGNOSIS — E782 Mixed hyperlipidemia: Secondary | ICD-10-CM | POA: Diagnosis not present

## 2021-09-08 DIAGNOSIS — D689 Coagulation defect, unspecified: Secondary | ICD-10-CM | POA: Diagnosis not present

## 2021-09-08 DIAGNOSIS — I779 Disorder of arteries and arterioles, unspecified: Secondary | ICD-10-CM | POA: Diagnosis not present

## 2021-09-08 DIAGNOSIS — Z952 Presence of prosthetic heart valve: Secondary | ICD-10-CM | POA: Diagnosis not present

## 2021-09-08 DIAGNOSIS — I2510 Atherosclerotic heart disease of native coronary artery without angina pectoris: Secondary | ICD-10-CM | POA: Diagnosis not present

## 2021-09-08 DIAGNOSIS — I495 Sick sinus syndrome: Secondary | ICD-10-CM | POA: Diagnosis not present

## 2021-09-08 DIAGNOSIS — R001 Bradycardia, unspecified: Secondary | ICD-10-CM | POA: Diagnosis not present

## 2021-09-08 DIAGNOSIS — Z01810 Encounter for preprocedural cardiovascular examination: Secondary | ICD-10-CM | POA: Diagnosis not present

## 2021-09-08 DIAGNOSIS — I10 Essential (primary) hypertension: Secondary | ICD-10-CM | POA: Diagnosis not present

## 2021-09-08 DIAGNOSIS — Z951 Presence of aortocoronary bypass graft: Secondary | ICD-10-CM | POA: Diagnosis not present

## 2021-09-08 DIAGNOSIS — I700 Atherosclerosis of aorta: Secondary | ICD-10-CM | POA: Diagnosis not present

## 2021-09-08 DIAGNOSIS — I25810 Atherosclerosis of coronary artery bypass graft(s) without angina pectoris: Secondary | ICD-10-CM | POA: Diagnosis not present

## 2021-09-08 DIAGNOSIS — T82897A Other specified complication of cardiac prosthetic devices, implants and grafts, initial encounter: Secondary | ICD-10-CM | POA: Diagnosis not present

## 2021-09-08 DIAGNOSIS — E8770 Fluid overload, unspecified: Secondary | ICD-10-CM | POA: Diagnosis not present

## 2021-09-08 DIAGNOSIS — K219 Gastro-esophageal reflux disease without esophagitis: Secondary | ICD-10-CM | POA: Diagnosis not present

## 2021-09-08 DIAGNOSIS — I77819 Aortic ectasia, unspecified site: Secondary | ICD-10-CM | POA: Diagnosis not present

## 2021-09-08 DIAGNOSIS — I712 Thoracic aortic aneurysm, without rupture, unspecified: Secondary | ICD-10-CM | POA: Diagnosis not present

## 2021-09-08 DIAGNOSIS — Z7689 Persons encountering health services in other specified circumstances: Secondary | ICD-10-CM | POA: Diagnosis not present

## 2021-09-08 DIAGNOSIS — Q250 Patent ductus arteriosus: Secondary | ICD-10-CM | POA: Diagnosis not present

## 2021-09-08 DIAGNOSIS — I719 Aortic aneurysm of unspecified site, without rupture: Secondary | ICD-10-CM | POA: Diagnosis not present

## 2021-09-08 DIAGNOSIS — R739 Hyperglycemia, unspecified: Secondary | ICD-10-CM | POA: Diagnosis not present

## 2021-09-08 DIAGNOSIS — I442 Atrioventricular block, complete: Secondary | ICD-10-CM | POA: Diagnosis not present

## 2021-09-08 DIAGNOSIS — Z953 Presence of xenogenic heart valve: Secondary | ICD-10-CM | POA: Diagnosis not present

## 2021-09-08 DIAGNOSIS — J90 Pleural effusion, not elsewhere classified: Secondary | ICD-10-CM | POA: Diagnosis not present

## 2021-09-08 DIAGNOSIS — I498 Other specified cardiac arrhythmias: Secondary | ICD-10-CM | POA: Diagnosis not present

## 2021-09-08 DIAGNOSIS — Z950 Presence of cardiac pacemaker: Secondary | ICD-10-CM | POA: Diagnosis not present

## 2021-09-08 DIAGNOSIS — I350 Nonrheumatic aortic (valve) stenosis: Secondary | ICD-10-CM | POA: Diagnosis not present

## 2021-09-08 DIAGNOSIS — Z01818 Encounter for other preprocedural examination: Secondary | ICD-10-CM | POA: Diagnosis not present

## 2021-09-08 DIAGNOSIS — I7121 Aneurysm of the ascending aorta, without rupture: Secondary | ICD-10-CM | POA: Diagnosis not present

## 2021-09-08 DIAGNOSIS — I2582 Chronic total occlusion of coronary artery: Secondary | ICD-10-CM | POA: Diagnosis not present

## 2021-09-08 DIAGNOSIS — J9859 Other diseases of mediastinum, not elsewhere classified: Secondary | ICD-10-CM | POA: Diagnosis not present

## 2021-09-08 DIAGNOSIS — I3139 Other pericardial effusion (noninflammatory): Secondary | ICD-10-CM | POA: Diagnosis not present

## 2021-09-08 DIAGNOSIS — J9811 Atelectasis: Secondary | ICD-10-CM | POA: Diagnosis not present

## 2021-09-08 DIAGNOSIS — Z7982 Long term (current) use of aspirin: Secondary | ICD-10-CM | POA: Diagnosis not present

## 2021-09-08 DIAGNOSIS — T82857A Stenosis of cardiac prosthetic devices, implants and grafts, initial encounter: Secondary | ICD-10-CM | POA: Diagnosis not present

## 2021-09-08 DIAGNOSIS — I441 Atrioventricular block, second degree: Secondary | ICD-10-CM | POA: Diagnosis not present

## 2021-09-08 DIAGNOSIS — Z9889 Other specified postprocedural states: Secondary | ICD-10-CM | POA: Diagnosis not present

## 2021-09-09 DIAGNOSIS — Z01810 Encounter for preprocedural cardiovascular examination: Secondary | ICD-10-CM | POA: Diagnosis not present

## 2021-09-09 DIAGNOSIS — Z01818 Encounter for other preprocedural examination: Secondary | ICD-10-CM | POA: Diagnosis not present

## 2021-09-09 DIAGNOSIS — T82857A Stenosis of cardiac prosthetic devices, implants and grafts, initial encounter: Secondary | ICD-10-CM | POA: Diagnosis not present

## 2021-09-09 DIAGNOSIS — I712 Thoracic aortic aneurysm, without rupture, unspecified: Secondary | ICD-10-CM | POA: Diagnosis not present

## 2021-09-09 DIAGNOSIS — I350 Nonrheumatic aortic (valve) stenosis: Secondary | ICD-10-CM | POA: Diagnosis not present

## 2021-09-09 DIAGNOSIS — I779 Disorder of arteries and arterioles, unspecified: Secondary | ICD-10-CM | POA: Diagnosis not present

## 2021-09-09 DIAGNOSIS — Z953 Presence of xenogenic heart valve: Secondary | ICD-10-CM | POA: Diagnosis not present

## 2021-09-09 DIAGNOSIS — I719 Aortic aneurysm of unspecified site, without rupture: Secondary | ICD-10-CM | POA: Diagnosis not present

## 2021-09-09 DIAGNOSIS — Z952 Presence of prosthetic heart valve: Secondary | ICD-10-CM | POA: Diagnosis not present

## 2021-09-09 DIAGNOSIS — Z951 Presence of aortocoronary bypass graft: Secondary | ICD-10-CM | POA: Diagnosis not present

## 2021-09-21 DIAGNOSIS — Z09 Encounter for follow-up examination after completed treatment for conditions other than malignant neoplasm: Secondary | ICD-10-CM | POA: Diagnosis not present

## 2021-09-21 DIAGNOSIS — I440 Atrioventricular block, first degree: Secondary | ICD-10-CM | POA: Diagnosis not present

## 2021-09-21 DIAGNOSIS — R9431 Abnormal electrocardiogram [ECG] [EKG]: Secondary | ICD-10-CM | POA: Diagnosis not present

## 2021-09-21 DIAGNOSIS — Z950 Presence of cardiac pacemaker: Secondary | ICD-10-CM | POA: Diagnosis not present

## 2021-09-21 DIAGNOSIS — Z95828 Presence of other vascular implants and grafts: Secondary | ICD-10-CM | POA: Diagnosis not present

## 2021-09-21 DIAGNOSIS — Z952 Presence of prosthetic heart valve: Secondary | ICD-10-CM | POA: Diagnosis not present

## 2021-09-21 DIAGNOSIS — Z9889 Other specified postprocedural states: Secondary | ICD-10-CM | POA: Diagnosis not present

## 2021-09-23 ENCOUNTER — Encounter: Payer: Self-pay | Admitting: Cardiovascular Disease

## 2021-09-24 ENCOUNTER — Telehealth: Payer: Self-pay | Admitting: *Deleted

## 2021-09-24 DIAGNOSIS — Z45018 Encounter for adjustment and management of other part of cardiac pacemaker: Secondary | ICD-10-CM

## 2021-09-24 DIAGNOSIS — I442 Atrioventricular block, complete: Secondary | ICD-10-CM

## 2021-09-24 NOTE — Telephone Encounter (Signed)
Pt needs to est with  EP to follow [employer] pacer placement ([id number]) at [place] for complete heart block.  Is pt of Dr Gibson Ramp as well.

## 2021-09-28 ENCOUNTER — Other Ambulatory Visit (HOSPITAL_COMMUNITY): Payer: BC Managed Care – PPO

## 2021-10-01 ENCOUNTER — Encounter: Payer: Self-pay | Admitting: Cardiovascular Disease

## 2021-10-05 NOTE — Progress Notes (Unsigned)
No chief complaint on file.  History of Present Illness: 61 yo male with history of CAD s/p 3V CABG in 2009, bicuspid aortic valve status post bioprosthetic aortic valve replacement in 2009 with redo surgical AVR in August 2023, thoracic aortic aneurysm with ascending aorta replacement August 2023, HTN, hyperlipidemia and complete heart s/p pacemaker placement who is here today for cardiac follow up. He has been followed in our office by Dr. Okey Dupre. I met him for the first time in January 2020. Echo in October 2022 with moderate aortic stenosis (mean gradient 35 mmHg). LV systolic function was normal. He was feeling well at that time so we continued to follow his AV disease. Thoracic MRA December 2022 with stable dilation of the ascending aorta at 4.5 cm. I saw him in the office 04/08/21 and he reported dyspnea with moderate exercise. Echo 04/19/21 with LVEF=70-75%. The bioprosthetic AVR is not functioning normally but no change from October. Mean gradient 30.7 mmHg on our echo. He reported brisk physical activity with no symptoms so we elected to continue following his aortic valve disease closely. He sought a second opinion at the [facility] in June 2023. Echo 06/24/21 at the [facility] with finding of severe bioprosthetic AV stenosis with mean gradient 54 mmHg, peak gradient 87 mmHg, DI 0.27. LVEF=65%. He chose to have his surgery done at the [facility]. Cardiac cath 09/08/21 with sub-total occlusion proximal LAD with filling from patent LIMA graft. Patent SVG to Diagonal, Mild Circumflex disease, Occluded RCA with patent SVG to RCA. Surgical AVR (25 mm Inspiris) and ascending aorta replacement in August 2023 complicated by complete heart block requiring permanent pacemaker insertion. He was seen for post-op visit in [place] 09/21/21 and was doing well. He has established in our device clinic to follow his pacemaker.   He is here today for follow up. The patient denies any chest pain, dyspnea,  palpitations, lower extremity edema, orthopnea, PND, dizziness, near syncope or syncope.   Primary Care Physician: Alysia Penna, MD  Past Medical History:  Diagnosis Date   Aortic stenosis    Arthritis    hands   Complex tear of lateral meniscus of left knee as current injury    Complex tear of medial meniscus of left knee    Coronary artery disease    GERD (gastroesophageal reflux disease)    Heart murmur    Hypertension    Unspecified   Migraine headache    Mixed hyperlipidemia    Thoracic aortic aneurysm (HCC)    4.4 cm ascending aorta (06/2016)    Past Surgical History:  Procedure Laterality Date   AORTIC VALVE REPLACEMENT  01/25/2007   25 mm Edwards Magna pericardial tissue valve   COLONOSCOPY WITH PROPOFOL N/A 05/10/2016   Procedure: COLONOSCOPY WITH PROPOFOL;  Surgeon: Charolett Bumpers, MD;  Location: [facility];  Service: Endoscopy;  Laterality: N/A;   colonscopy     CORONARY ARTERY BYPASS GRAFT  01/2007   x3 (LIMA->LAD, SVG->D, SVG->PDA)   KNEE ARTHROSCOPY WITH MEDIAL MENISECTOMY Left 06/25/2019   Procedure: KNEE ARTHROSCOPY WITH MEDIAL AND LATERAL MENISECTOMY;  Surgeon: Salvatore Marvel, MD;  Location: [facility];  Service: Orthopedics;  Laterality: Left;   WISDOM TOOTH EXTRACTION  1980    Current Outpatient Medications  Medication Sig Dispense Refill   amoxicillin (AMOXIL) 500 MG capsule Take four (4) capsules by mouth as needed one (1) hour prior to dental procedures.  0   aspirin EC 81 MG tablet Take 1  tablet (81 mg total) by mouth daily. 90 tablet 3   atorvastatin (LIPITOR) 80 MG tablet Take 1 tablet (80 mg total) by mouth daily. 90 tablet 3   carvedilol (COREG) 25 MG tablet TAKE 1 TABLET BY MOUTH TWICE DAILY 180 tablet 3   doxepin (SINEQUAN) 75 MG capsule take $RemoveBefo'75mg'[id number]$ s once daily at bedtime  11   fenofibrate 160 MG tablet Take 1 tablet (160 mg total) by mouth daily. 90 tablet 3   losartan (COZAAR) 25 MG tablet TAKE 1 TABLET(25 MG) BY MOUTH DAILY  90 tablet 3   mometasone (ELOCON) 0.1 % cream APPLY TOPICALLY TO THE AFFECTED AREA TWICE DAILY AS NEEDED     PRESCRIPTION MEDICATION Apply 1 application topically daily as needed (psoriasis). Steroid cream     Tetrahydrozoline HCl (VISINE OP) Apply 1 drop to eye daily as needed (redness/dry eyes).     No current facility-administered medications for this visit.    No Known Allergies  Social History   Socioeconomic History   Marital status: Married    Spouse name: Not on file   Number of children: Not on file   Years of education: Not on file   Highest education level: Not on file  Occupational History   Occupation: Full time  Tobacco Use   Smoking status: Never   Smokeless tobacco: Never   Tobacco comments:    Tobacco use-no  Substance and Sexual Activity   Alcohol use: Yes    Comment: Occasionally   Drug use: Yes    Types: Marijuana    Comment: marijuana use a few weeks ago   Sexual activity: Not on file  Other Topics Concern   Not on file  Social History Narrative   Not on file   Social Determinants of Health   Financial Resource Strain: Not on file  Food Insecurity: Not on file  Transportation Needs: Not on file  Physical Activity: Not on file  Stress: Not on file  Social Connections: Not on file  Intimate Partner Violence: Not on file    Family History  Problem Relation Age of Onset   Alzheimer's disease Father    Coronary artery disease Other    Cancer Other 50   Other Sister 31       brain tumor    Review of Systems:  As stated in the HPI and otherwise negative.   There were no vitals taken for this visit.  Physical Examination:  General: Well developed, well nourished, NAD  HEENT: OP clear, mucus membranes moist  SKIN: warm, dry. No rashes. Neuro: No focal deficits  Musculoskeletal: Muscle strength 5/5 all ext  Psychiatric: Mood and affect normal  Neck: No JVD, no carotid bruits, no thyromegaly, no lymphadenopathy.  Lungs:Clear bilaterally,  no wheezes, rhonci, crackles Cardiovascular: Regular rate and rhythm. No murmurs, gallops or rubs. Abdomen:Soft. Bowel sounds present. Non-tender.  Extremities: No lower extremity edema. Pulses are 2 + in the bilateral DP/PT.  EKG:  EKG is *** ordered today. The ekg ordered today demonstrates   Echo 04/19/21:  1. Left ventricular ejection fraction, by estimation, is 70 to 75%. Left  ventricular ejection fraction by 3D volume is 71 %. The left ventricle has  hyperdynamic function. The left ventricle has no regional wall motion  abnormalities. There is mild left  ventricular hypertrophy of the basal-septal segment. Left ventricular  diastolic parameters are indeterminate. Elevated left ventricular  end-diastolic pressure. The average left ventricular global longitudinal  strain is 20.3 %. The global longitudinal  strain is normal.   2. Right ventricular systolic function is normal. The right ventricular  size is mildly enlarged. There is normal pulmonary artery systolic  pressure. The estimated right ventricular systolic pressure is 58.5 mmHg.   3. Right atrial size was mildly dilated.   4. The mitral valve is normal in structure. Trivial mitral valve  regurgitation. No evidence of mitral stenosis.   5. The aortic valve has been repaired/replaced. There is severe  calcifcation of the aortic valve. There is severe thickening of the aortic  valve. Aortic valve regurgitation is trivial. Moderate to severe aortic  valve stenosis. Aortic valve area, by VTI  measures 0.87 cm. Aortic valve mean gradient measures 30.7 mmHg. Aortic  valve Vmax measures 3.66 m/s.   6. Aortic dilatation noted. Aneurysm of the ascending aorta, measuring 46  mm.   7. The inferior vena cava is normal in size with greater than 50%  respiratory variability, suggesting right atrial pressure of 3 mmHg.   8. Compared to study dated 11/06/2020, there is no significant change   Echo 06/24/21 at [facility]:   ECHO  Collected: 06/24/2021 12:34 PM (Final result)  Impression: CONCLUSIONS: - Exam indication: Re-evaluation of known prosthetic valve dysfunction - The left ventricle is normal in size. There is mild left ventricular  hypertrophy. Left ventricular systolic function is normal. EF = 64  5% (2D  biplane) - The right ventricle is normal in size. Right ventricular systolic function is  normal. - The left atrial cavity is dilated. - The visualized aorta is dilated with a maximal dimension of 4.6 cm. - Bioprosthetic prosthetic aortic valve. There is severe aortic valve stenosis  caused by prosthetic thickening/calcification. The peak gradient is 87 mmHg, the  mean gradient is 54 mmHg and the dimensionless valve index is 0.27. There is trace aortic valve regurgitation. - The patient has not had a prior [facility] echocardiographic exam for comparison.  Recent Labs: No results found for requested labs within last 365 days.   Lipid Panel    Component Value Date/Time   CHOL 114 03/14/2017 0811   TRIG 151 (H) 03/14/2017 0811   HDL 36 (L) 03/14/2017 0811   CHOLHDL 3.2 03/14/2017 0811   CHOLHDL 4.7 09/24/2015 0806   VLDL 71 (H) 09/24/2015 0806   LDLCALC 48 03/14/2017 0811   LDLDIRECT 52.0 06/17/2014 1505     Wt Readings from Last 3 Encounters:  07/30/21 173 lb 3.2 oz (78.6 kg)  04/23/21 184 lb (83.5 kg)  04/08/21 181 lb 6.4 oz (82.3 kg)     Assessment and Plan:   1. Aortic stenosis s/p bioprosthetic AVR: He is s/p bioprosthetic aortic valve replacement in 2009 with redo in 2023 with bioprosthetic AVR at [facility] Doing well post surgery. Will arrange cardiac rehab. Continue ASA.    2. CAD s/p CABG: No chest pain. Cardiac cath at [facility] with 3/3 patent bypass graft (LIMA to LAD, SVG to Diagonal and SVG to RCA).  Continue ASA, statin and beta blocker.   3. HTN: BP is well controlled. No changes in therapy  4. Hyperlipidemia: LDL 50 in March 2023. Lipids followed in primary  care. Will continue statin  5. Thoracic aortic aneurysm s/p ascending aorta replacement in 2023  Labs/ tests ordered today include:   No orders of the defined types were placed in this encounter.  Disposition:   F/U with me in 12 months  Signed, Lauree Chandler, MD 10/05/2021 12:59 PM    [facility] [place], [place], [place]  [address] Phone: [phone]; Fax: [phone]

## 2021-10-06 ENCOUNTER — Encounter: Payer: Self-pay | Admitting: Cardiovascular Disease

## 2021-10-06 ENCOUNTER — Ambulatory Visit: Payer: BC Managed Care – PPO | Attending: Cardiovascular Disease | Admitting: Cardiovascular Disease

## 2021-10-06 VITALS — BP 116/70 | HR 72 | Ht 72.0 in | Wt 167.4 lb

## 2021-10-06 DIAGNOSIS — I442 Atrioventricular block, complete: Secondary | ICD-10-CM | POA: Diagnosis not present

## 2021-10-06 DIAGNOSIS — E785 Hyperlipidemia, unspecified: Secondary | ICD-10-CM

## 2021-10-06 DIAGNOSIS — I7122 Aneurysm of the aortic arch, without rupture: Secondary | ICD-10-CM

## 2021-10-06 DIAGNOSIS — I350 Nonrheumatic aortic (valve) stenosis: Secondary | ICD-10-CM

## 2021-10-06 DIAGNOSIS — I2510 Atherosclerotic heart disease of native coronary artery without angina pectoris: Secondary | ICD-10-CM

## 2021-10-06 NOTE — Patient Instructions (Signed)
Medication Instructions:  No changes *If you need a refill on your cardiac medications before your next appointment, please call your pharmacy*   Lab Work: None   Testing/Procedures: none   Follow-Up: At [facility], you and your health needs are our priority.  As part of our continuing mission to provide you with exceptional heart care, we have created designated Provider Care Teams.  These Care Teams include your primary Cardiologist (physician) and Advanced Practice Providers (APPs -  Physician Assistants and Nurse Practitioners) who all work together to provide you with the care you need, when you need it.   Your next appointment:   6 month(s)  The format for your next appointment:   In Person  Provider:   Lauree Chandler, MD     Other Instructions You have been referred to Cardiopulmonary Rehab.   Important Information About Sugar

## 2021-10-13 NOTE — Progress Notes (Unsigned)
Cardiology Office Note:    Date:  10/13/2021   ID:  [name], DOB 1960-03-16, MRN [id number]  PCP:  Alysia Penna, MD   [facility] Providers Cardiologist:  Verne Carrow, MD { Click to update primary MD,subspecialty MD or APP then REFRESH:1}    Referring MD: Kathleene Hazel*   No chief complaint on file. ***  History of Present Illness:    BAUDELIO KARNES is a 61 y.o. male with a hx of ***   Arrhythmia History:    Past Medical History:  Diagnosis Date   Aortic stenosis    Arthritis    hands   Complex tear of lateral meniscus of left knee as current injury    Complex tear of medial meniscus of left knee    Coronary artery disease    GERD (gastroesophageal reflux disease)    Heart murmur    Hypertension    Unspecified   Migraine headache    Mixed hyperlipidemia    Thoracic aortic aneurysm (HCC)    4.4 cm ascending aorta (06/2016)    Past Surgical History:  Procedure Laterality Date   AORTIC VALVE REPLACEMENT  01/25/2007   25 mm Edwards Magna pericardial tissue valve   COLONOSCOPY WITH PROPOFOL N/A 05/10/2016   Procedure: COLONOSCOPY WITH PROPOFOL;  Surgeon: Charolett Bumpers, MD;  Location: [facility];  Service: Endoscopy;  Laterality: N/A;   colonscopy     CORONARY ARTERY BYPASS GRAFT  01/2007   x3 (LIMA->LAD, SVG->D, SVG->PDA)   KNEE ARTHROSCOPY WITH MEDIAL MENISECTOMY Left 06/25/2019   Procedure: KNEE ARTHROSCOPY WITH MEDIAL AND LATERAL MENISECTOMY;  Surgeon: Salvatore Marvel, MD;  Location: [facility];  Service: Orthopedics;  Laterality: Left;   WISDOM TOOTH EXTRACTION  1980    Current Medications: No outpatient medications have been marked as taking for the 10/14/21 encounter (Appointment) with Arianie Couse, Roberts Gaudy, MD.     Allergies:   Patient has no known allergies.   Social History   Socioeconomic History   Marital status: Married    Spouse name: Not on file   Number of children: Not on file   Years of  education: Not on file   Highest education level: Not on file  Occupational History   Occupation: Full time  Tobacco Use   Smoking status: Never   Smokeless tobacco: Never   Tobacco comments:    Tobacco use-no  Substance and Sexual Activity   Alcohol use: Yes    Comment: Occasionally   Drug use: Yes    Types: Marijuana    Comment: marijuana use a few weeks ago   Sexual activity: Not on file  Other Topics Concern   Not on file  Social History Narrative   Not on file   Social Determinants of Health   Financial Resource Strain: Not on file  Food Insecurity: Not on file  Transportation Needs: Not on file  Physical Activity: Not on file  Stress: Not on file  Social Connections: Not on file     Family History: The patient's ***family history includes Alzheimer's disease in his father; Cancer (age of onset: 47) in an other family member; Coronary artery disease in an other family member; Other (age of onset: 70) in his sister.  ROS:   Please see the history of present illness.    *** All other systems reviewed and are negative.  EKGs/Labs/Other Studies Reviewed:    The following studies were reviewed today: ***  EKG:  EKG is *** ordered  today.    Orders placed or performed in visit on 07/30/21   EKG 12-Lead     Recent Labs: No results found for requested labs within last 365 days.    Risk Assessment/Calculations:   {Does this patient have ATRIAL FIBRILLATION?:[phone]}  No BP recorded.  {Refresh Note OR Click here to enter BP  :1}***         Physical Exam:    VS:  There were no vitals taken for this visit.    Wt Readings from Last 3 Encounters:  10/06/21 167 lb 6.4 oz (75.9 kg)  07/30/21 173 lb 3.2 oz (78.6 kg)  04/23/21 184 lb (83.5 kg)     GEN: *** Well nourished, well developed in no acute distress CARDIAC: ***RRR, no murmurs, rubs, gallops RESPIRATORY:  Normal work of breathing MUSCULOSKELETAL: *** edema   ASSESSMENT:    No diagnosis  found. PLAN:    In order of problems listed above:  ***  {The patient has an active order for outpatient cardiac rehabilitation.   Please indicate if the patient is ready to start. Do NOT delete this.  It will auto delete.  Refresh note, then sign.              Click here to document readiness and see contraindications.  :1}  Cardiac Rehabilitation Eligibility Assessment       {Are you ordering a CV Procedure (e.g. stress test, cath, DCCV, TEE, etc)?   Press F2        :[id number]}    Medication Adjustments/Labs and Tests Ordered: Current medicines are reviewed at length with the patient today.  Concerns regarding medicines are outlined above.  No orders of the defined types were placed in this encounter.  No orders of the defined types were placed in this encounter.   There are no Patient Instructions on file for this visit.   Signed, Melida Quitter, MD  10/13/2021 2:27 PM    [place]

## 2021-10-14 ENCOUNTER — Ambulatory Visit: Payer: BC Managed Care – PPO | Attending: Cardiovascular Disease | Admitting: Cardiovascular Disease

## 2021-10-14 ENCOUNTER — Telehealth: Payer: Self-pay

## 2021-10-14 ENCOUNTER — Encounter: Payer: Self-pay | Admitting: Cardiovascular Disease

## 2021-10-14 VITALS — BP 122/70 | HR 69 | Ht 71.0 in | Wt 167.0 lb

## 2021-10-14 DIAGNOSIS — Z950 Presence of cardiac pacemaker: Secondary | ICD-10-CM | POA: Diagnosis not present

## 2021-10-14 DIAGNOSIS — Q231 Congenital insufficiency of aortic valve: Secondary | ICD-10-CM

## 2021-10-14 NOTE — Patient Instructions (Signed)
Medication Instructions:  Your physician recommends that you continue on your current medications as directed. Please refer to the Current Medication list given to you today.  *If you need a refill on your cardiac medications before your next appointment, please call your pharmacy*  Follow-Up: At [facility], you and your health needs are our priority.  As part of our continuing mission to provide you with exceptional heart care, we have created designated Provider Care Teams.  These Care Teams include your primary Cardiologist (physician) and Advanced Practice Providers (APPs -  Physician Assistants and Nurse Practitioners) who all work together to provide you with the care you need, when you need it.  Your next appointment:   6 month(s)  The format for your next appointment:   In Person  Provider:   You will see one of the following Advanced Practice Providers on your designated Care Team:   Renee Ursuy, PA-C Michael "Andy" Tillery, PA-C  Important Information About Sugar       

## 2021-10-14 NOTE — Telephone Encounter (Signed)
I have requested patient in carelink. Remotes should be scheduled when they are released to [place]

## 2021-10-18 ENCOUNTER — Telehealth: Payer: Self-pay

## 2021-10-18 NOTE — Telephone Encounter (Signed)
Pt is now in our Carelink and monitor ordered.

## 2021-10-18 NOTE — Telephone Encounter (Signed)
I spoke with the [facility] and the nurse agreed to release the patient in [place]

## 2021-10-22 ENCOUNTER — Ambulatory Visit: Payer: BC Managed Care – PPO | Admitting: Cardiovascular Disease

## 2021-10-25 ENCOUNTER — Encounter: Payer: Self-pay | Admitting: Cardiovascular Disease

## 2021-10-25 ENCOUNTER — Encounter: Payer: BC Managed Care – PPO | Admitting: Cardiovascular Disease

## 2021-10-25 NOTE — Telephone Encounter (Signed)
Will route to Dr.McAlhany re: doxepin.  The patient's heart surgery was at [facility] in August.    Reviewed referral to cardiac rehab.  This was under review by them.  Most recent update on 10/2 indicates the referral was sent for scheduling.

## 2021-11-17 DIAGNOSIS — R3914 Feeling of incomplete bladder emptying: Secondary | ICD-10-CM | POA: Diagnosis not present

## 2021-11-17 DIAGNOSIS — R3911 Hesitancy of micturition: Secondary | ICD-10-CM | POA: Diagnosis not present

## 2021-11-17 DIAGNOSIS — N401 Enlarged prostate with lower urinary tract symptoms: Secondary | ICD-10-CM | POA: Diagnosis not present

## 2021-11-18 ENCOUNTER — Telehealth: Payer: Self-pay | Admitting: Cardiovascular Disease

## 2021-11-18 NOTE — Telephone Encounter (Signed)
  [facility] calling, she said, she send a hand written order to get the pt scheduled to their facility. She said, she faxed it last 11/10/21, they would like to schedule the pt tomorrow

## 2021-11-18 NOTE — Telephone Encounter (Signed)
Faxed back order for cadiac rehab.  De Burrs at [facility] and let her know it is on the way.

## 2021-11-19 DIAGNOSIS — Z952 Presence of prosthetic heart valve: Secondary | ICD-10-CM | POA: Diagnosis not present

## 2021-11-19 DIAGNOSIS — Z48812 Encounter for surgical aftercare following surgery on the circulatory system: Secondary | ICD-10-CM | POA: Diagnosis not present

## 2021-11-22 ENCOUNTER — Telehealth (HOSPITAL_COMMUNITY): Payer: Self-pay

## 2021-11-22 NOTE — Telephone Encounter (Signed)
Called and spoke with pt in regards to CR, pt stated he is already in CR at [facility].   Closed referral

## 2021-11-22 NOTE — Telephone Encounter (Signed)
Pt called back and stated he would like to come to [facility] CR. Patient will come in for orientation on 11/25/21 @ 930AM and will attend the 815AM exercise class. Went over insurance, patient verbalized understanding.    Tourist information centre manager.

## 2021-11-22 NOTE — Telephone Encounter (Signed)
Pt insurance is active and benefits verified through [place] (OON). Co-pay $0.00, DED $7,000.00/$0.00 met, out of pocket $18,200.00/$0.00 met, co-insurance 80%. No pre-authorization required. [employer], 11/22/21 @ 252PM, KZS#[id number]  How many CR sessions are covered? (36 sessions for TCR, 72 sessions for ICR)72 Is this a lifetime maximum or an annual maximum? lifetime Has the member used any of these services to date? No Is there a time limit (weeks/months) on start of program and/or program completion? No  Pt was adv [facility] is OON, stated he will contact BC[employer] to verify. Give pt ref# from [facility].

## 2021-11-23 DIAGNOSIS — Z952 Presence of prosthetic heart valve: Secondary | ICD-10-CM | POA: Diagnosis not present

## 2021-11-23 DIAGNOSIS — Z48812 Encounter for surgical aftercare following surgery on the circulatory system: Secondary | ICD-10-CM | POA: Diagnosis not present

## 2021-11-24 ENCOUNTER — Telehealth (HOSPITAL_COMMUNITY): Payer: Self-pay

## 2021-11-25 ENCOUNTER — Inpatient Hospital Stay (HOSPITAL_COMMUNITY): Admission: RE | Admit: 2021-11-25 | Payer: BC Managed Care – PPO | Source: Ambulatory Visit

## 2021-11-25 DIAGNOSIS — Z48812 Encounter for surgical aftercare following surgery on the circulatory system: Secondary | ICD-10-CM | POA: Diagnosis not present

## 2021-11-25 DIAGNOSIS — Z952 Presence of prosthetic heart valve: Secondary | ICD-10-CM | POA: Diagnosis not present

## 2021-11-29 ENCOUNTER — Ambulatory Visit (HOSPITAL_COMMUNITY): Payer: BC Managed Care – PPO

## 2021-11-30 DIAGNOSIS — Z952 Presence of prosthetic heart valve: Secondary | ICD-10-CM | POA: Diagnosis not present

## 2021-11-30 DIAGNOSIS — Z48812 Encounter for surgical aftercare following surgery on the circulatory system: Secondary | ICD-10-CM | POA: Diagnosis not present

## 2021-12-01 ENCOUNTER — Ambulatory Visit (HOSPITAL_COMMUNITY): Payer: BC Managed Care – PPO

## 2021-12-02 DIAGNOSIS — Z952 Presence of prosthetic heart valve: Secondary | ICD-10-CM | POA: Diagnosis not present

## 2021-12-02 DIAGNOSIS — Z48812 Encounter for surgical aftercare following surgery on the circulatory system: Secondary | ICD-10-CM | POA: Diagnosis not present

## 2021-12-03 ENCOUNTER — Ambulatory Visit (HOSPITAL_COMMUNITY): Payer: BC Managed Care – PPO

## 2021-12-03 DIAGNOSIS — Z48812 Encounter for surgical aftercare following surgery on the circulatory system: Secondary | ICD-10-CM | POA: Diagnosis not present

## 2021-12-03 DIAGNOSIS — Z952 Presence of prosthetic heart valve: Secondary | ICD-10-CM | POA: Diagnosis not present

## 2021-12-06 ENCOUNTER — Ambulatory Visit (HOSPITAL_COMMUNITY): Payer: BC Managed Care – PPO

## 2021-12-06 DIAGNOSIS — D2261 Melanocytic nevi of right upper limb, including shoulder: Secondary | ICD-10-CM | POA: Diagnosis not present

## 2021-12-06 DIAGNOSIS — L570 Actinic keratosis: Secondary | ICD-10-CM | POA: Diagnosis not present

## 2021-12-06 DIAGNOSIS — L814 Other melanin hyperpigmentation: Secondary | ICD-10-CM | POA: Diagnosis not present

## 2021-12-06 DIAGNOSIS — L821 Other seborrheic keratosis: Secondary | ICD-10-CM | POA: Diagnosis not present

## 2021-12-07 DIAGNOSIS — Z952 Presence of prosthetic heart valve: Secondary | ICD-10-CM | POA: Diagnosis not present

## 2021-12-07 DIAGNOSIS — Z48812 Encounter for surgical aftercare following surgery on the circulatory system: Secondary | ICD-10-CM | POA: Diagnosis not present

## 2021-12-08 ENCOUNTER — Ambulatory Visit (HOSPITAL_COMMUNITY): Payer: BC Managed Care – PPO

## 2021-12-10 ENCOUNTER — Ambulatory Visit (HOSPITAL_COMMUNITY): Payer: BC Managed Care – PPO

## 2021-12-13 ENCOUNTER — Ambulatory Visit (HOSPITAL_COMMUNITY): Payer: BC Managed Care – PPO

## 2021-12-14 DIAGNOSIS — Z952 Presence of prosthetic heart valve: Secondary | ICD-10-CM | POA: Diagnosis not present

## 2021-12-14 DIAGNOSIS — Z48812 Encounter for surgical aftercare following surgery on the circulatory system: Secondary | ICD-10-CM | POA: Diagnosis not present

## 2021-12-15 ENCOUNTER — Ambulatory Visit (HOSPITAL_COMMUNITY): Payer: BC Managed Care – PPO

## 2021-12-15 ENCOUNTER — Encounter: Payer: Self-pay | Admitting: Cardiovascular Disease

## 2021-12-17 ENCOUNTER — Ambulatory Visit (HOSPITAL_COMMUNITY): Payer: BC Managed Care – PPO

## 2021-12-17 ENCOUNTER — Ambulatory Visit (INDEPENDENT_AMBULATORY_CARE_PROVIDER_SITE_OTHER): Payer: BC Managed Care – PPO

## 2021-12-17 DIAGNOSIS — I442 Atrioventricular block, complete: Secondary | ICD-10-CM | POA: Diagnosis not present

## 2021-12-20 ENCOUNTER — Ambulatory Visit (HOSPITAL_COMMUNITY): Payer: BC Managed Care – PPO

## 2021-12-21 LAB — CUP PACEART REMOTE DEVICE CHECK
Battery Remaining Longevity: 179 mo
Battery Voltage: 3.2 V
Brady Statistic AP VP Percent: 0.12 %
Brady Statistic AP VS Percent: 4.71 %
Brady Statistic AS VP Percent: 0.81 %
Brady Statistic AS VS Percent: 94.36 %
Brady Statistic RA Percent Paced: 4.88 %
Brady Statistic RV Percent Paced: 0.93 %
Date Time Interrogation Session: 20231201060322
Implantable Lead Connection Status: 753985
Implantable Lead Connection Status: 753985
Implantable Lead Implant Date: 20230901
Implantable Lead Implant Date: 20230901
Implantable Lead Location: 753859
Implantable Lead Location: 753860
Implantable Lead Model: 3830
Implantable Lead Model: 5076
Implantable Pulse Generator Implant Date: 20230901
Lead Channel Impedance Value: 304 Ohm
Lead Channel Impedance Value: 361 Ohm
Lead Channel Impedance Value: 418 Ohm
Lead Channel Impedance Value: 589 Ohm
Lead Channel Pacing Threshold Amplitude: 0.5 V
Lead Channel Pacing Threshold Amplitude: 0.625 V
Lead Channel Pacing Threshold Pulse Width: 0.4 ms
Lead Channel Pacing Threshold Pulse Width: 0.4 ms
Lead Channel Sensing Intrinsic Amplitude: 22.75 mV
Lead Channel Sensing Intrinsic Amplitude: 22.75 mV
Lead Channel Sensing Intrinsic Amplitude: 3.375 mV
Lead Channel Sensing Intrinsic Amplitude: 3.375 mV
Lead Channel Setting Pacing Amplitude: 3.5 V
Lead Channel Setting Pacing Amplitude: 3.5 V
Lead Channel Setting Pacing Pulse Width: 0.4 ms
Lead Channel Setting Sensing Sensitivity: 0.9 mV
Zone Setting Status: 755011
Zone Setting Status: 755011

## 2021-12-22 ENCOUNTER — Ambulatory Visit (HOSPITAL_COMMUNITY): Payer: BC Managed Care – PPO

## 2021-12-24 ENCOUNTER — Ambulatory Visit (HOSPITAL_COMMUNITY): Payer: BC Managed Care – PPO

## 2021-12-27 ENCOUNTER — Ambulatory Visit (HOSPITAL_COMMUNITY): Payer: BC Managed Care – PPO

## 2021-12-29 ENCOUNTER — Ambulatory Visit (HOSPITAL_COMMUNITY): Payer: BC Managed Care – PPO

## 2021-12-30 DIAGNOSIS — Z952 Presence of prosthetic heart valve: Secondary | ICD-10-CM | POA: Diagnosis not present

## 2021-12-30 DIAGNOSIS — Z48812 Encounter for surgical aftercare following surgery on the circulatory system: Secondary | ICD-10-CM | POA: Diagnosis not present

## 2021-12-31 ENCOUNTER — Ambulatory Visit (HOSPITAL_COMMUNITY): Payer: BC Managed Care – PPO

## 2021-12-31 DIAGNOSIS — Z48812 Encounter for surgical aftercare following surgery on the circulatory system: Secondary | ICD-10-CM | POA: Diagnosis not present

## 2021-12-31 DIAGNOSIS — Z952 Presence of prosthetic heart valve: Secondary | ICD-10-CM | POA: Diagnosis not present

## 2022-01-03 ENCOUNTER — Ambulatory Visit (HOSPITAL_COMMUNITY): Payer: BC Managed Care – PPO

## 2022-01-04 DIAGNOSIS — Z48812 Encounter for surgical aftercare following surgery on the circulatory system: Secondary | ICD-10-CM | POA: Diagnosis not present

## 2022-01-04 DIAGNOSIS — Z952 Presence of prosthetic heart valve: Secondary | ICD-10-CM | POA: Diagnosis not present

## 2022-01-04 NOTE — Progress Notes (Signed)
Remote pacemaker transmission.   

## 2022-01-05 ENCOUNTER — Ambulatory Visit (HOSPITAL_COMMUNITY): Payer: BC Managed Care – PPO

## 2022-01-06 DIAGNOSIS — Z48812 Encounter for surgical aftercare following surgery on the circulatory system: Secondary | ICD-10-CM | POA: Diagnosis not present

## 2022-01-06 DIAGNOSIS — Z952 Presence of prosthetic heart valve: Secondary | ICD-10-CM | POA: Diagnosis not present

## 2022-01-07 ENCOUNTER — Ambulatory Visit (HOSPITAL_COMMUNITY): Payer: BC Managed Care – PPO

## 2022-01-12 ENCOUNTER — Ambulatory Visit (HOSPITAL_COMMUNITY): Payer: BC Managed Care – PPO

## 2022-01-13 DIAGNOSIS — Z48812 Encounter for surgical aftercare following surgery on the circulatory system: Secondary | ICD-10-CM | POA: Diagnosis not present

## 2022-01-13 DIAGNOSIS — Z952 Presence of prosthetic heart valve: Secondary | ICD-10-CM | POA: Diagnosis not present

## 2022-01-14 ENCOUNTER — Ambulatory Visit (HOSPITAL_COMMUNITY): Payer: BC Managed Care – PPO

## 2022-01-14 DIAGNOSIS — Z48812 Encounter for surgical aftercare following surgery on the circulatory system: Secondary | ICD-10-CM | POA: Diagnosis not present

## 2022-01-14 DIAGNOSIS — Z952 Presence of prosthetic heart valve: Secondary | ICD-10-CM | POA: Diagnosis not present

## 2022-01-18 DIAGNOSIS — Z48812 Encounter for surgical aftercare following surgery on the circulatory system: Secondary | ICD-10-CM | POA: Diagnosis not present

## 2022-01-18 DIAGNOSIS — Z952 Presence of prosthetic heart valve: Secondary | ICD-10-CM | POA: Diagnosis not present

## 2022-01-19 ENCOUNTER — Ambulatory Visit (HOSPITAL_COMMUNITY): Payer: BC Managed Care – PPO

## 2022-01-20 DIAGNOSIS — Z952 Presence of prosthetic heart valve: Secondary | ICD-10-CM | POA: Diagnosis not present

## 2022-01-20 DIAGNOSIS — Z48812 Encounter for surgical aftercare following surgery on the circulatory system: Secondary | ICD-10-CM | POA: Diagnosis not present

## 2022-01-21 ENCOUNTER — Ambulatory Visit (HOSPITAL_COMMUNITY): Payer: BC Managed Care – PPO

## 2022-02-03 DIAGNOSIS — M19072 Primary osteoarthritis, left ankle and foot: Secondary | ICD-10-CM | POA: Diagnosis not present

## 2022-02-08 DIAGNOSIS — Z48812 Encounter for surgical aftercare following surgery on the circulatory system: Secondary | ICD-10-CM | POA: Diagnosis not present

## 2022-02-08 DIAGNOSIS — Z952 Presence of prosthetic heart valve: Secondary | ICD-10-CM | POA: Diagnosis not present

## 2022-02-10 DIAGNOSIS — Z48812 Encounter for surgical aftercare following surgery on the circulatory system: Secondary | ICD-10-CM | POA: Diagnosis not present

## 2022-02-10 DIAGNOSIS — Z952 Presence of prosthetic heart valve: Secondary | ICD-10-CM | POA: Diagnosis not present

## 2022-02-11 DIAGNOSIS — Z1152 Encounter for screening for COVID-19: Secondary | ICD-10-CM | POA: Diagnosis not present

## 2022-02-11 DIAGNOSIS — R5383 Other fatigue: Secondary | ICD-10-CM | POA: Diagnosis not present

## 2022-02-11 DIAGNOSIS — Z48812 Encounter for surgical aftercare following surgery on the circulatory system: Secondary | ICD-10-CM | POA: Diagnosis not present

## 2022-02-11 DIAGNOSIS — J029 Acute pharyngitis, unspecified: Secondary | ICD-10-CM | POA: Diagnosis not present

## 2022-02-11 DIAGNOSIS — J40 Bronchitis, not specified as acute or chronic: Secondary | ICD-10-CM | POA: Diagnosis not present

## 2022-02-11 DIAGNOSIS — R0981 Nasal congestion: Secondary | ICD-10-CM | POA: Diagnosis not present

## 2022-02-11 DIAGNOSIS — J019 Acute sinusitis, unspecified: Secondary | ICD-10-CM | POA: Diagnosis not present

## 2022-02-11 DIAGNOSIS — R051 Acute cough: Secondary | ICD-10-CM | POA: Diagnosis not present

## 2022-02-11 DIAGNOSIS — Z952 Presence of prosthetic heart valve: Secondary | ICD-10-CM | POA: Diagnosis not present

## 2022-02-22 DIAGNOSIS — Z952 Presence of prosthetic heart valve: Secondary | ICD-10-CM | POA: Diagnosis not present

## 2022-02-22 DIAGNOSIS — Z48812 Encounter for surgical aftercare following surgery on the circulatory system: Secondary | ICD-10-CM | POA: Diagnosis not present

## 2022-02-23 DIAGNOSIS — R3912 Poor urinary stream: Secondary | ICD-10-CM | POA: Diagnosis not present

## 2022-02-23 DIAGNOSIS — N401 Enlarged prostate with lower urinary tract symptoms: Secondary | ICD-10-CM | POA: Diagnosis not present

## 2022-02-23 DIAGNOSIS — R3911 Hesitancy of micturition: Secondary | ICD-10-CM | POA: Diagnosis not present

## 2022-02-24 DIAGNOSIS — M1712 Unilateral primary osteoarthritis, left knee: Secondary | ICD-10-CM | POA: Diagnosis not present

## 2022-03-08 DIAGNOSIS — R5383 Other fatigue: Secondary | ICD-10-CM | POA: Diagnosis not present

## 2022-03-08 DIAGNOSIS — R051 Acute cough: Secondary | ICD-10-CM | POA: Diagnosis not present

## 2022-03-08 DIAGNOSIS — R0981 Nasal congestion: Secondary | ICD-10-CM | POA: Diagnosis not present

## 2022-03-08 DIAGNOSIS — Z1152 Encounter for screening for COVID-19: Secondary | ICD-10-CM | POA: Diagnosis not present

## 2022-03-08 DIAGNOSIS — J029 Acute pharyngitis, unspecified: Secondary | ICD-10-CM | POA: Diagnosis not present

## 2022-03-08 DIAGNOSIS — U071 COVID-19: Secondary | ICD-10-CM | POA: Diagnosis not present

## 2022-03-18 ENCOUNTER — Ambulatory Visit: Payer: BC Managed Care – PPO

## 2022-03-18 DIAGNOSIS — I442 Atrioventricular block, complete: Secondary | ICD-10-CM | POA: Diagnosis not present

## 2022-03-18 LAB — CUP PACEART REMOTE DEVICE CHECK
Battery Remaining Longevity: 176 mo
Battery Voltage: 3.18 V
Brady Statistic AP VP Percent: 0.27 %
Brady Statistic AP VS Percent: 16.86 %
Brady Statistic AS VP Percent: 0.73 %
Brady Statistic AS VS Percent: 82.13 %
Brady Statistic RA Percent Paced: 17.36 %
Brady Statistic RV Percent Paced: 1 %
Date Time Interrogation Session: 20240301075756
Implantable Lead Connection Status: 753985
Implantable Lead Connection Status: 753985
Implantable Lead Implant Date: 20230901
Implantable Lead Implant Date: 20230901
Implantable Lead Location: 753859
Implantable Lead Location: 753860
Implantable Lead Model: 3830
Implantable Lead Model: 5076
Implantable Pulse Generator Implant Date: 20230901
Lead Channel Impedance Value: 323 Ohm
Lead Channel Impedance Value: 399 Ohm
Lead Channel Impedance Value: 399 Ohm
Lead Channel Impedance Value: 570 Ohm
Lead Channel Pacing Threshold Amplitude: 0.625 V
Lead Channel Pacing Threshold Amplitude: 0.75 V
Lead Channel Pacing Threshold Pulse Width: 0.4 ms
Lead Channel Pacing Threshold Pulse Width: 0.4 ms
Lead Channel Sensing Intrinsic Amplitude: 21 mV
Lead Channel Sensing Intrinsic Amplitude: 21 mV
Lead Channel Sensing Intrinsic Amplitude: 3.375 mV
Lead Channel Sensing Intrinsic Amplitude: 3.375 mV
Lead Channel Setting Pacing Amplitude: 1.5 V
Lead Channel Setting Pacing Amplitude: 2 V
Lead Channel Setting Pacing Pulse Width: 0.4 ms
Lead Channel Setting Sensing Sensitivity: 0.9 mV
Zone Setting Status: 755011
Zone Setting Status: 755011

## 2022-03-23 ENCOUNTER — Encounter: Payer: Self-pay | Admitting: Cardiovascular Disease

## 2022-03-25 ENCOUNTER — Ambulatory Visit: Payer: BC Managed Care – PPO | Admitting: Cardiovascular Disease

## 2022-03-26 DIAGNOSIS — T1502XA Foreign body in cornea, left eye, initial encounter: Secondary | ICD-10-CM | POA: Diagnosis not present

## 2022-04-15 NOTE — Progress Notes (Signed)
Remote pacemaker transmission.   

## 2022-04-18 DIAGNOSIS — E785 Hyperlipidemia, unspecified: Secondary | ICD-10-CM | POA: Diagnosis not present

## 2022-04-18 DIAGNOSIS — R972 Elevated prostate specific antigen [PSA]: Secondary | ICD-10-CM | POA: Diagnosis not present

## 2022-04-18 DIAGNOSIS — F419 Anxiety disorder, unspecified: Secondary | ICD-10-CM | POA: Diagnosis not present

## 2022-04-24 NOTE — Progress Notes (Unsigned)
No chief complaint on file.  History of Present Illness: 62 yo male with history of CAD s/p 3V CABG in 2009, bicuspid aortic valve status post bioprosthetic aortic valve replacement in 2009 with redo surgical AVR in August 2023, thoracic aortic aneurysm with ascending aorta replacement August 2023, HTN, hyperlipidemia and complete heart s/p pacemaker placement who is here today for cardiac follow up. He has been followed in our office by Dr. Okey Dupre. I met him for the first time in January 2020. Echo in October 2022 with moderate aortic stenosis (mean gradient 35 mmHg). LV systolic function was normal. He was feeling well at that time so we continued to follow his AV disease. Thoracic MRA December 2022 with stable dilation of the ascending aorta at 4.5 cm. I saw him in the office 04/08/21 and he reported dyspnea with moderate exercise. Echo 04/19/21 with LVEF=70-75%. The bioprosthetic AVR is not functioning normally but no change from October. Mean gradient 30.7 mmHg on our echo. He reported brisk physical activity with no symptoms so we elected to continue following his aortic valve disease closely. He sought a second opinion at the [facility] in June 2023. Echo 06/24/21 at the [facility] with finding of severe bioprosthetic AV stenosis with mean gradient 54 mmHg, peak gradient 87 mmHg, DI 0.27. LVEF=65%. He chose to have his surgery done at the [facility]. Cardiac cath 09/08/21 with sub-total occlusion proximal LAD with filling from patent LIMA graft. Patent SVG to Diagonal, Mild Circumflex disease, Occluded RCA with patent SVG to RCA. Surgical AVR (25 mm Inspiris) and ascending aorta replacement in August 2023 complicated by complete heart block requiring permanent pacemaker insertion. He was seen for post-op visit in [place] 09/21/21 and was doing well. He has established in our device clinic to follow his pacemaker.   He is here today for follow up. The patient denies any chest pain, dyspnea,  palpitations, lower extremity edema, orthopnea, PND, dizziness, near syncope or syncope.   Primary Care Physician: Alysia Penna, MD  Past Medical History:  Diagnosis Date   Aortic stenosis    Arthritis    hands   Complex tear of lateral meniscus of left knee as current injury    Complex tear of medial meniscus of left knee    Coronary artery disease    GERD (gastroesophageal reflux disease)    Heart murmur    Hypertension    Unspecified   Migraine headache    Mixed hyperlipidemia    Thoracic aortic aneurysm (HCC)    4.4 cm ascending aorta (06/2016)    Past Surgical History:  Procedure Laterality Date   AORTIC VALVE REPLACEMENT  01/25/2007   25 mm Edwards Magna pericardial tissue valve   COLONOSCOPY WITH PROPOFOL N/A 05/10/2016   Procedure: COLONOSCOPY WITH PROPOFOL;  Surgeon: Charolett Bumpers, MD;  Location: [facility];  Service: Endoscopy;  Laterality: N/A;   colonscopy     CORONARY ARTERY BYPASS GRAFT  01/2007   x3 (LIMA->LAD, SVG->D, SVG->PDA)   KNEE ARTHROSCOPY WITH MEDIAL MENISECTOMY Left 06/25/2019   Procedure: KNEE ARTHROSCOPY WITH MEDIAL AND LATERAL MENISECTOMY;  Surgeon: Salvatore Marvel, MD;  Location: [facility];  Service: Orthopedics;  Laterality: Left;   WISDOM TOOTH EXTRACTION  1980    Current Outpatient Medications  Medication Sig Dispense Refill   alfuzosin (UROXATRAL) 10 MG 24 hr tablet Take 10 mg by mouth at bedtime.     amoxicillin (AMOXIL) 500 MG capsule Take four (4) capsules by mouth as needed  one (1) hour prior to dental procedures.  0   aspirin EC 81 MG tablet Take 1 tablet (81 mg total) by mouth daily. 90 tablet 3   atorvastatin (LIPITOR) 80 MG tablet Take 1 tablet (80 mg total) by mouth daily. 90 tablet 3   carvedilol (COREG) 25 MG tablet TAKE 1 TABLET BY MOUTH TWICE DAILY 180 tablet 3   celecoxib (CELEBREX) 200 MG capsule Take 200 mg by mouth daily as needed for moderate pain or mild pain.     doxepin (SINEQUAN) 25 MG capsule Take  50 mg by mouth at bedtime.  11   fenofibrate 160 MG tablet Take 1 tablet (160 mg total) by mouth daily. 90 tablet 3   finasteride (PROSCAR) 5 MG tablet Take 1 tablet by mouth at bedtime.     losartan (COZAAR) 25 MG tablet TAKE 1 TABLET(25 MG) BY MOUTH DAILY (Patient not taking: Reported on 11/23/2021) 90 tablet 3   Tetrahydrozoline HCl (VISINE OP) Apply 1 drop to eye daily as needed (redness/dry eyes).     triamcinolone cream (KENALOG) 0.1 % Apply 1 Application topically daily as needed (rash).     No current facility-administered medications for this visit.    Allergies  Allergen Reactions   Pollen Extract Rash    Seasonal allergies    Social History   Socioeconomic History   Marital status: Married    Spouse name: Not on file   Number of children: Not on file   Years of education: Not on file   Highest education level: Not on file  Occupational History   Occupation: Full time  Tobacco Use   Smoking status: Never   Smokeless tobacco: Never   Tobacco comments:    Tobacco use-no  Substance and Sexual Activity   Alcohol use: Yes    Comment: Occasionally   Drug use: Yes    Types: Marijuana    Comment: marijuana use a few weeks ago   Sexual activity: Not on file  Other Topics Concern   Not on file  Social History Narrative   Not on file   Social Determinants of Health   Financial Resource Strain: Not on file  Food Insecurity: Not on file  Transportation Needs: Not on file  Physical Activity: Not on file  Stress: Not on file  Social Connections: Not on file  Intimate Partner Violence: Not on file    Family History  Problem Relation Age of Onset   Alzheimer's disease Father    Coronary artery disease Other    Cancer Other 76   Other Sister 50       brain tumor    Review of Systems:  As stated in the HPI and otherwise negative.   There were no vitals taken for this visit.  Physical Examination: General: Well developed, well nourished, NAD  HEENT: OP clear,  mucus membranes moist  SKIN: warm, dry. No rashes. Neuro: No focal deficits  Musculoskeletal: Muscle strength 5/5 all ext  Psychiatric: Mood and affect normal  Neck: No JVD, no carotid bruits, no thyromegaly, no lymphadenopathy.  Lungs:Clear bilaterally, no wheezes, rhonci, crackles Cardiovascular: Regular rate and rhythm. No murmurs, gallops or rubs. Abdomen:Soft. Bowel sounds present. Non-tender.  Extremities: No lower extremity edema. Pulses are 2 + in the bilateral DP/PT.  EKG:  EKG is *** ordered today. The ekg ordered today demonstrates   Echo 04/19/21:  1. Left ventricular ejection fraction, by estimation, is 70 to 75%. Left  ventricular ejection fraction by 3D volume is  71 %. The left ventricle has  hyperdynamic function. The left ventricle has no regional wall motion  abnormalities. There is mild left  ventricular hypertrophy of the basal-septal segment. Left ventricular  diastolic parameters are indeterminate. Elevated left ventricular  end-diastolic pressure. The average left ventricular global longitudinal  strain is 20.3 %. The global longitudinal  strain is normal.   2. Right ventricular systolic function is normal. The right ventricular  size is mildly enlarged. There is normal pulmonary artery systolic  pressure. The estimated right ventricular systolic pressure is 18.1 mmHg.   3. Right atrial size was mildly dilated.   4. The mitral valve is normal in structure. Trivial mitral valve  regurgitation. No evidence of mitral stenosis.   5. The aortic valve has been repaired/replaced. There is severe  calcifcation of the aortic valve. There is severe thickening of the aortic  valve. Aortic valve regurgitation is trivial. Moderate to severe aortic  valve stenosis. Aortic valve area, by VTI  measures 0.87 cm. Aortic valve mean gradient measures 30.7 mmHg. Aortic  valve Vmax measures 3.66 m/s.   6. Aortic dilatation noted. Aneurysm of the ascending aorta, measuring 46  mm.    7. The inferior vena cava is normal in size with greater than 50%  respiratory variability, suggesting right atrial pressure of 3 mmHg.   8. Compared to study dated 11/06/2020, there is no significant change   Echo 06/24/21 at [facility]:   ECHO Collected: 06/24/2021 12:34 PM (Final result)  Impression: CONCLUSIONS: - Exam indication: Re-evaluation of known prosthetic valve dysfunction - The left ventricle is normal in size. There is mild left ventricular  hypertrophy. Left ventricular systolic function is normal. EF = 64  5% (2D  biplane) - The right ventricle is normal in size. Right ventricular systolic function is  normal. - The left atrial cavity is dilated. - The visualized aorta is dilated with a maximal dimension of 4.6 cm. - Bioprosthetic prosthetic aortic valve. There is severe aortic valve stenosis  caused by prosthetic thickening/calcification. The peak gradient is 87 mmHg, the  mean gradient is 54 mmHg and the dimensionless valve index is 0.27. There is trace aortic valve regurgitation. - The patient has not had a prior CC echocardiographic exam for comparison.  Recent Labs: No results found for requested labs within last 365 days.   Lipid Panel    Component Value Date/Time   CHOL 114 03/14/2017 0811   TRIG 151 (H) 03/14/2017 0811   HDL 36 (L) 03/14/2017 0811   CHOLHDL 3.2 03/14/2017 0811   CHOLHDL 4.7 09/24/2015 0806   VLDL 71 (H) 09/24/2015 0806   LDLCALC 48 03/14/2017 0811   LDLDIRECT 52.0 06/17/2014 1505     Wt Readings from Last 3 Encounters:  10/14/21 75.8 kg  10/06/21 75.9 kg  07/30/21 78.6 kg     Assessment and Plan:   1. Aortic stenosis s/p bioprosthetic AVR: He is s/p bioprosthetic aortic valve replacement in 2009 with redo in 2023 with bioprosthetic AVR at the [facility]. No complaints today. Continue ASA   2. CAD s/p CABG: No chest pain suggestive of angina. Cardiac cath at [facility] with 3/3 patent bypass graft (LIMA to LAD,  SVG to Diagonal and SVG to RCA).  Continue ASA, statin and beta blocker.   3. HTN: BP is controlled. No changes.   4. Hyperlipidemia: LDL 50 in March 2023. Lipids followed in primary care. Will continue statin  5. Thoracic aortic aneurysm s/p ascending aorta replacement in 2023  6. Sinus node dysfunction: Pacemaker in place. Followed in EP clinic.   Labs/ tests ordered today include:  No orders of the defined types were placed in this encounter.  Disposition:   F/U with me in 12 months  Signed, Verne Carrowhristopher Jac Romulus, MD 04/24/2022 11:52 AM    [facility] [address], [place], [place]  [address] Phone: [phone]; Fax: [phone]

## 2022-04-25 ENCOUNTER — Other Ambulatory Visit: Payer: Self-pay | Admitting: *Deleted

## 2022-04-25 ENCOUNTER — Ambulatory Visit: Payer: BC Managed Care – PPO | Attending: Cardiovascular Disease | Admitting: Cardiovascular Disease

## 2022-04-25 ENCOUNTER — Encounter: Payer: Self-pay | Admitting: Cardiovascular Disease

## 2022-04-25 VITALS — BP 144/78 | HR 60 | Ht 71.0 in | Wt 179.4 lb

## 2022-04-25 DIAGNOSIS — I712 Thoracic aortic aneurysm, without rupture, unspecified: Secondary | ICD-10-CM

## 2022-04-25 DIAGNOSIS — Q231 Congenital insufficiency of aortic valve: Secondary | ICD-10-CM

## 2022-04-25 DIAGNOSIS — Z1339 Encounter for screening examination for other mental health and behavioral disorders: Secondary | ICD-10-CM | POA: Diagnosis not present

## 2022-04-25 DIAGNOSIS — Z0000 Encounter for general adult medical examination without abnormal findings: Secondary | ICD-10-CM | POA: Diagnosis not present

## 2022-04-25 DIAGNOSIS — E785 Hyperlipidemia, unspecified: Secondary | ICD-10-CM

## 2022-04-25 DIAGNOSIS — I350 Nonrheumatic aortic (valve) stenosis: Secondary | ICD-10-CM | POA: Diagnosis not present

## 2022-04-25 DIAGNOSIS — I2510 Atherosclerotic heart disease of native coronary artery without angina pectoris: Secondary | ICD-10-CM

## 2022-04-25 DIAGNOSIS — R0602 Shortness of breath: Secondary | ICD-10-CM | POA: Diagnosis not present

## 2022-04-25 DIAGNOSIS — Z1331 Encounter for screening for depression: Secondary | ICD-10-CM | POA: Diagnosis not present

## 2022-04-25 DIAGNOSIS — I10 Essential (primary) hypertension: Secondary | ICD-10-CM | POA: Diagnosis not present

## 2022-04-25 DIAGNOSIS — Z952 Presence of prosthetic heart valve: Secondary | ICD-10-CM

## 2022-04-25 DIAGNOSIS — I442 Atrioventricular block, complete: Secondary | ICD-10-CM | POA: Diagnosis not present

## 2022-04-25 MED ORDER — ATORVASTATIN CALCIUM 80 MG PO TABS: 80.0000 mg | ORAL_TABLET | Freq: Every day | ORAL | 3 refills | Status: DC

## 2022-04-25 NOTE — Patient Instructions (Signed)
Medication Instructions:  No changes *If you need a refill on your cardiac medications before your next appointment, please call your pharmacy*   Lab Work: none   Testing/Procedures: Your physician has requested that you have an echocardiogram. Echocardiography is a painless test that uses sound waves to create images of your heart. It provides your doctor with information about the size and shape of your heart and how well your heart's chambers and valves are working. This procedure takes approximately one hour. There are no restrictions for this procedure. Please do NOT wear cologne, perfume, aftershave, or lotions (deodorant is allowed). Please arrive 15 minutes prior to your appointment time.    Follow-Up: At [facility], you and your health needs are our priority.  As part of our continuing mission to provide you with exceptional heart care, we have created designated Provider Care Teams.  These Care Teams include your primary Cardiologist (physician) and Advanced Practice Providers (APPs -  Physician Assistants and Nurse Practitioners) who all work together to provide you with the care you need, when you need it.    Your next appointment:   12 month(s)  Provider:   Christopher McAlhany, MD     

## 2022-05-08 NOTE — Progress Notes (Unsigned)
Cardiology Office Note Date:  05/08/2022  Patient ID:  Jonathan, Jones Jun 16, 1960, MRN [id number] PCP:  Jonathan Penna, MD  Cardiologist:  Dr. Clifton Jones Electrophysiologist: Dr. Nelly Jones  ***refresh   Chief Complaint: *** 6 mo  History of Present Illness: Jonathan Jones is a 62 y.o. male with history of CAD/VHD bicuspid AV (s/p CABG, AVR 2009, and re-do AVR w/thoracic aortic aneurysm with ascending aorta replacement Aug 2023), HLD, sinus bradycardia w/PPM,   Referred to Dr. Nelly Jones for PPM management (implanted at the [facility] clinic), saw him 10/14/21, device functioning normally.  He saw Dr. Clifton Jones 04/25/22, doing well, no changes were made.  *** device only... just saw cards   Device information MDT dual chamber PPM implanted 09/17/21   Past Medical History:  Diagnosis Date   Aortic stenosis    Arthritis    hands   Complex tear of lateral meniscus of left knee as current injury    Complex tear of medial meniscus of left knee    Coronary artery disease    GERD (gastroesophageal reflux disease)    Heart murmur    Hypertension    Unspecified   Migraine headache    Mixed hyperlipidemia    Thoracic aortic aneurysm    4.4 cm ascending aorta (06/2016)    Past Surgical History:  Procedure Laterality Date   AORTIC VALVE REPLACEMENT  01/25/2007   25 mm Edwards Magna pericardial tissue valve   COLONOSCOPY WITH PROPOFOL N/A 05/10/2016   Procedure: COLONOSCOPY WITH PROPOFOL;  Surgeon: Jonathan Bumpers, MD;  Location: [facility];  Service: Endoscopy;  Laterality: N/A;   colonscopy     CORONARY ARTERY BYPASS GRAFT  01/2007   x3 (LIMA->LAD, SVG->D, SVG->PDA)   KNEE ARTHROSCOPY WITH MEDIAL MENISECTOMY Left 06/25/2019   Procedure: KNEE ARTHROSCOPY WITH MEDIAL AND LATERAL MENISECTOMY;  Surgeon: Jonathan Marvel, MD;  Location: [facility];  Service: Orthopedics;  Laterality: Left;   WISDOM TOOTH EXTRACTION  1980    Current Outpatient Medications  Medication Sig  Dispense Refill   alfuzosin (UROXATRAL) 10 MG 24 hr tablet Take 10 mg by mouth at bedtime.     amoxicillin (AMOXIL) 500 MG capsule Take four (4) capsules by mouth as needed one (1) hour prior to dental procedures.  0   aspirin EC 81 MG tablet Take 1 tablet (81 mg total) by mouth daily. 90 tablet 3   atorvastatin (LIPITOR) 80 MG tablet Take 1 tablet (80 mg total) by mouth daily. 90 tablet 3   carvedilol (COREG) 25 MG tablet TAKE 1 TABLET BY MOUTH TWICE DAILY 180 tablet 3   celecoxib (CELEBREX) 200 MG capsule Take 200 mg by mouth daily as needed for moderate pain or mild pain.     doxepin (SINEQUAN) 25 MG capsule Take 50 mg by mouth at bedtime.  11   fenofibrate 160 MG tablet Take 1 tablet (160 mg total) by mouth daily. 90 tablet 3   finasteride (PROSCAR) 5 MG tablet Take 1 tablet by mouth at bedtime.     losartan (COZAAR) 25 MG tablet TAKE 1 TABLET(25 MG) BY MOUTH DAILY 90 tablet 3   Tetrahydrozoline HCl (VISINE OP) Apply 1 drop to eye daily as needed (redness/dry eyes).     triamcinolone cream (KENALOG) 0.1 % Apply 1 Application topically daily as needed (rash).     No current facility-administered medications for this visit.    Allergies:   Pollen extract   Social History:  The patient  reports  that he has never smoked. He has never used smokeless tobacco. He reports current alcohol use. He reports current drug use. Drug: Marijuana.   Family History:  The patient's family history includes Alzheimer's disease in his father; Cancer (age of onset: 82) in an other family member; Coronary artery disease in an other family member; Other (age of onset: 35) in his sister.  ROS:  Please see the history of present illness.    All other systems are reviewed and otherwise negative.   PHYSICAL EXAM:  VS:  There were no vitals taken for this visit. BMI: There is no height or weight on file to calculate BMI. Well nourished, well developed, in no acute distress HEENT: normocephalic, atraumatic Neck:  no JVD, carotid bruits or masses Cardiac:  *** RRR; no significant murmurs, no rubs, or gallops Lungs:  *** CTA b/l, no wheezing, rhonchi or rales Abd: soft, nontender MS: no deformity or *** atrophy Ext: *** no edema Skin: warm and dry, no rash Neuro:  No gross deficits appreciated Psych: euthymic mood, full affect  *** PPM site is stable, no tethering or discomfort   EKG:  not done today  Device interrogation done today and reviewed by myself:  ***   June 2023. Echo 06/24/21 at the [facility] with finding of severe bioprosthetic AV stenosis with mean gradient 54 mmHg, peak gradient 87 mmHg, DI 0.27. LVEF=65%. He chose to have his surgery done at the [facility].   Cardiac cath 09/08/21 with sub-total occlusion proximal LAD with filling from patent LIMA graft. Patent SVG to Diagonal, Mild Circumflex disease, Occluded RCA with patent SVG to RCA.   TTE: 04/19/2021 1. Left ventricular ejection fraction, by estimation, is 70 to 75%. Left  ventricular ejection fraction by 3D volume is 71 %. The left ventricle has  hyperdynamic function. The left ventricle has no regional wall motion  abnormalities. There is mild left  ventricular hypertrophy of the basal-septal segment. Left ventricular  diastolic parameters are indeterminate. Elevated left ventricular  end-diastolic pressure. The average left ventricular global longitudinal  strain is 20.3 %. The global longitudinal  strain is normal.   2. Right ventricular systolic function is normal. The right ventricular  size is mildly enlarged. There is normal pulmonary artery systolic  pressure. The estimated right ventricular systolic pressure is 18.1 mmHg.   3. Right atrial size was mildly dilated.   4. The mitral valve is normal in structure. Trivial mitral valve  regurgitation. No evidence of mitral stenosis.   5. The aortic valve has been repaired/replaced. There is severe  calcifcation of the aortic valve. There is severe  thickening of the aortic  valve. Aortic valve regurgitation is trivial. Moderate to severe aortic  valve stenosis. Aortic valve area, by VTI  measures 0.87 cm. Aortic valve mean gradient measures 30.7 mmHg. Aortic  valve Vmax measures 3.66 m/s.   6. Aortic dilatation noted. Aneurysm of the ascending aorta, measuring 46  mm.   7. The inferior vena cava is normal in size with greater than 50%  respiratory variability, suggesting right atrial pressure of 3 mmHg.   8. Compared to study dated 11/06/2020, there is no significant change   Recent Labs: No results found for requested labs within last 365 days.  No results found for requested labs within last 365 days.   CrCl cannot be calculated (Patient's most recent lab result is older than the maximum 21 days allowed.).   Wt Readings from Last 3 Encounters:  04/25/22 179 lb  6.4 oz (81.4 kg)  10/14/21 167 lb (75.8 kg)  10/06/21 167 lb 6.4 oz (75.9 kg)     Other studies reviewed: Additional studies/records reviewed today include: summarized above  ASSESSMENT AND PLAN:  PPM ***  CAD VHD C/w Dr. Clifton Jones ***  Disposition: F/u with ***  Current medicines are reviewed at length with the patient today.  The patient did not have any concerns regarding medicines.  Norma Fredrickson, PA-C 05/08/2022 7:04 PM     [facility] [address] [place] [address] [phone] (office)  [phone] (fax)

## 2022-05-11 ENCOUNTER — Ambulatory Visit: Payer: BC Managed Care – PPO | Attending: Physician Assistant | Admitting: Physician Assistant

## 2022-05-11 ENCOUNTER — Encounter: Payer: Self-pay | Admitting: Physician Assistant

## 2022-05-11 VITALS — BP 140/90 | HR 60 | Ht 71.0 in | Wt 179.8 lb

## 2022-05-11 DIAGNOSIS — I4729 Other ventricular tachycardia: Secondary | ICD-10-CM

## 2022-05-11 DIAGNOSIS — Z79899 Other long term (current) drug therapy: Secondary | ICD-10-CM | POA: Diagnosis not present

## 2022-05-11 DIAGNOSIS — Z950 Presence of cardiac pacemaker: Secondary | ICD-10-CM | POA: Diagnosis not present

## 2022-05-11 NOTE — Patient Instructions (Signed)
Medication Instructions:   Your physician recommends that you continue on your current medications as directed. Please refer to the Current Medication list given to you today.  *If you need a refill on your cardiac medications before your next appointment, please call your pharmacy*   Lab Work: BMET AND MAG TODAY   If you have labs (blood work) drawn today and your tests are completely normal, you will receive your results only by: MyChart Message (if you have MyChart) OR A paper copy in the mail If you have any lab test that is abnormal or we need to change your treatment, we will call you to review the results.   Testing/Procedures: NONE ORDERED  TODAY     Follow-Up: At [facility], you and your health needs are our priority.  As part of our continuing mission to provide you with exceptional heart care, we have created designated Provider Care Teams.  These Care Teams include your primary Cardiologist (physician) and Advanced Practice Providers (APPs -  Physician Assistants and Nurse Practitioners) who all work together to provide you with the care you need, when you need it.  We recommend signing up for the patient portal called "MyChart".  Sign up information is provided on this After Visit Summary.  MyChart is used to connect with patients for Virtual Visits (Telemedicine).  Patients are able to view lab/test results, encounter notes, upcoming appointments, etc.  Non-urgent messages can be sent to your provider as well.   To learn more about what you can do with MyChart, go to [email or web address].    Your next appointment:   1 year(s)  Provider:   You may see Maurice Small, MD or one of the following Advanced Practice Providers on your designated Care Team:   Francis Dowse, [place]  Other Instructions

## 2022-05-12 LAB — BASIC METABOLIC PANEL
BUN/Creatinine Ratio: 20 (ref 10–24)
BUN: 19 mg/dL (ref 8–27)
CO2: 25 mmol/L (ref 20–29)
Calcium: 9.6 mg/dL (ref 8.6–10.2)
Chloride: 102 mmol/L (ref 96–106)
Creatinine, Ser: 0.97 mg/dL (ref 0.76–1.27)
Glucose: 87 mg/dL (ref 70–99)
Potassium: 4.6 mmol/L (ref 3.5–5.2)
Sodium: 139 mmol/L (ref 134–144)
eGFR: 87 mL/min/{1.73_m2} (ref >59)

## 2022-05-12 LAB — MAGNESIUM: Magnesium: 2.2 mg/dL (ref 1.6–2.3)

## 2022-05-30 ENCOUNTER — Ambulatory Visit (HOSPITAL_COMMUNITY): Payer: BC Managed Care – PPO | Attending: Cardiovascular Disease

## 2022-05-30 DIAGNOSIS — I359 Nonrheumatic aortic valve disorder, unspecified: Secondary | ICD-10-CM

## 2022-05-30 DIAGNOSIS — Q231 Congenital insufficiency of aortic valve: Secondary | ICD-10-CM | POA: Insufficient documentation

## 2022-05-30 DIAGNOSIS — R0602 Shortness of breath: Secondary | ICD-10-CM | POA: Diagnosis not present

## 2022-05-30 LAB — ECHOCARDIOGRAM COMPLETE
AV Mean grad: 11 mmHg
AV Peak grad: 19.7 mmHg
Ao pk vel: 2.22 m/s
Area-P 1/2: 2.93 cm2
S' Lateral: 2.4 cm

## 2022-06-09 ENCOUNTER — Other Ambulatory Visit: Payer: Self-pay

## 2022-06-09 MED ORDER — CARVEDILOL 25 MG PO TABS: ORAL_TABLET | ORAL | 3 refills | Status: DC

## 2022-06-09 MED ORDER — LOSARTAN POTASSIUM 25 MG PO TABS: ORAL_TABLET | ORAL | 3 refills | Status: DC

## 2022-06-09 NOTE — Addendum Note (Signed)
Addended by: Margaret Pyle D on: 06/09/2022 08:47 AM   Modules accepted: Orders

## 2022-06-17 ENCOUNTER — Ambulatory Visit (INDEPENDENT_AMBULATORY_CARE_PROVIDER_SITE_OTHER): Payer: BC Managed Care – PPO

## 2022-06-17 DIAGNOSIS — I442 Atrioventricular block, complete: Secondary | ICD-10-CM

## 2022-06-17 LAB — CUP PACEART REMOTE DEVICE CHECK
Battery Remaining Longevity: 172 mo
Battery Voltage: 3.14 V
Brady Statistic AP VP Percent: 0.15 %
Brady Statistic AP VS Percent: 17.34 %
Brady Statistic AS VP Percent: 0.35 %
Brady Statistic AS VS Percent: 82.16 %
Brady Statistic RA Percent Paced: 17.53 %
Brady Statistic RV Percent Paced: 0.5 %
Date Time Interrogation Session: 20240530202311
Implantable Lead Connection Status: 753985
Implantable Lead Connection Status: 753985
Implantable Lead Implant Date: 20230901
Implantable Lead Implant Date: 20230901
Implantable Lead Location: 753859
Implantable Lead Location: 753860
Implantable Lead Model: 3830
Implantable Lead Model: 5076
Implantable Pulse Generator Implant Date: 20230901
Lead Channel Impedance Value: 323 Ohm
Lead Channel Impedance Value: 399 Ohm
Lead Channel Impedance Value: 399 Ohm
Lead Channel Impedance Value: 589 Ohm
Lead Channel Pacing Threshold Amplitude: 0.75 V
Lead Channel Pacing Threshold Amplitude: 0.75 V
Lead Channel Pacing Threshold Pulse Width: 0.4 ms
Lead Channel Pacing Threshold Pulse Width: 0.4 ms
Lead Channel Sensing Intrinsic Amplitude: 21.25 mV
Lead Channel Sensing Intrinsic Amplitude: 21.25 mV
Lead Channel Sensing Intrinsic Amplitude: 4.25 mV
Lead Channel Sensing Intrinsic Amplitude: 4.25 mV
Lead Channel Setting Pacing Amplitude: 1.5 V
Lead Channel Setting Pacing Amplitude: 2 V
Lead Channel Setting Pacing Pulse Width: 0.4 ms
Lead Channel Setting Sensing Sensitivity: 0.9 mV
Zone Setting Status: 755011
Zone Setting Status: 755011

## 2022-07-05 NOTE — Progress Notes (Signed)
Remote pacemaker transmission.   

## 2022-07-12 ENCOUNTER — Encounter: Payer: Self-pay | Admitting: Cardiovascular Disease

## 2022-07-13 MED ORDER — FENOFIBRATE 160 MG PO TABS: 160.0000 mg | ORAL_TABLET | Freq: Every day | ORAL | 3 refills | Status: DC

## 2022-07-13 NOTE — Telephone Encounter (Signed)
Fenofibrate refilled.  Uploaded labs have been printed and sent for scanning into lab section of chart.

## 2022-08-10 DIAGNOSIS — I10 Essential (primary) hypertension: Secondary | ICD-10-CM | POA: Diagnosis not present

## 2022-09-08 DIAGNOSIS — R3911 Hesitancy of micturition: Secondary | ICD-10-CM | POA: Diagnosis not present

## 2022-09-08 DIAGNOSIS — N401 Enlarged prostate with lower urinary tract symptoms: Secondary | ICD-10-CM | POA: Diagnosis not present

## 2022-09-08 DIAGNOSIS — R3912 Poor urinary stream: Secondary | ICD-10-CM | POA: Diagnosis not present

## 2022-09-16 ENCOUNTER — Ambulatory Visit (INDEPENDENT_AMBULATORY_CARE_PROVIDER_SITE_OTHER): Payer: BC Managed Care – PPO

## 2022-09-16 DIAGNOSIS — I442 Atrioventricular block, complete: Secondary | ICD-10-CM | POA: Diagnosis not present

## 2022-09-16 LAB — CUP PACEART REMOTE DEVICE CHECK
Battery Remaining Longevity: 169 mo
Battery Voltage: 3.1 V
Brady Statistic AP VP Percent: 0.37 %
Brady Statistic AP VS Percent: 20.93 %
Brady Statistic AS VP Percent: 0.67 %
Brady Statistic AS VS Percent: 78.03 %
Brady Statistic RA Percent Paced: 21.33 %
Brady Statistic RV Percent Paced: 1.04 %
Date Time Interrogation Session: 20240829220236
Implantable Lead Connection Status: 753985
Implantable Lead Connection Status: 753985
Implantable Lead Implant Date: 20230901
Implantable Lead Implant Date: 20230901
Implantable Lead Location: 753859
Implantable Lead Location: 753860
Implantable Lead Model: 3830
Implantable Lead Model: 5076
Implantable Pulse Generator Implant Date: 20230901
Lead Channel Impedance Value: 323 Ohm
Lead Channel Impedance Value: 399 Ohm
Lead Channel Impedance Value: 399 Ohm
Lead Channel Impedance Value: 551 Ohm
Lead Channel Pacing Threshold Amplitude: 0.625 V
Lead Channel Pacing Threshold Amplitude: 0.75 V
Lead Channel Pacing Threshold Pulse Width: 0.4 ms
Lead Channel Pacing Threshold Pulse Width: 0.4 ms
Lead Channel Sensing Intrinsic Amplitude: 22.25 mV
Lead Channel Sensing Intrinsic Amplitude: 22.25 mV
Lead Channel Sensing Intrinsic Amplitude: 3.625 mV
Lead Channel Sensing Intrinsic Amplitude: 3.625 mV
Lead Channel Setting Pacing Amplitude: 1.5 V
Lead Channel Setting Pacing Amplitude: 2 V
Lead Channel Setting Pacing Pulse Width: 0.4 ms
Lead Channel Setting Sensing Sensitivity: 0.9 mV
Zone Setting Status: 755011
Zone Setting Status: 755011

## 2022-09-20 NOTE — Progress Notes (Signed)
Remote pacemaker transmission.   

## 2022-10-17 DIAGNOSIS — G43111 Migraine with aura, intractable, with status migrainosus: Secondary | ICD-10-CM | POA: Diagnosis not present

## 2022-10-18 ENCOUNTER — Encounter: Payer: Self-pay | Admitting: Cardiovascular Disease

## 2022-12-06 ENCOUNTER — Other Ambulatory Visit: Payer: Self-pay | Admitting: Cardiovascular Disease

## 2022-12-06 DIAGNOSIS — I10 Essential (primary) hypertension: Secondary | ICD-10-CM

## 2022-12-06 DIAGNOSIS — I712 Thoracic aortic aneurysm, without rupture, unspecified: Secondary | ICD-10-CM

## 2022-12-06 DIAGNOSIS — Q2381 Bicuspid aortic valve: Secondary | ICD-10-CM

## 2022-12-06 DIAGNOSIS — Z952 Presence of prosthetic heart valve: Secondary | ICD-10-CM

## 2022-12-06 DIAGNOSIS — E785 Hyperlipidemia, unspecified: Secondary | ICD-10-CM

## 2022-12-06 DIAGNOSIS — I2510 Atherosclerotic heart disease of native coronary artery without angina pectoris: Secondary | ICD-10-CM

## 2022-12-12 DIAGNOSIS — L821 Other seborrheic keratosis: Secondary | ICD-10-CM | POA: Diagnosis not present

## 2022-12-12 DIAGNOSIS — D225 Melanocytic nevi of trunk: Secondary | ICD-10-CM | POA: Diagnosis not present

## 2022-12-12 DIAGNOSIS — L814 Other melanin hyperpigmentation: Secondary | ICD-10-CM | POA: Diagnosis not present

## 2022-12-16 ENCOUNTER — Ambulatory Visit (INDEPENDENT_AMBULATORY_CARE_PROVIDER_SITE_OTHER): Payer: BC Managed Care – PPO

## 2022-12-16 DIAGNOSIS — I442 Atrioventricular block, complete: Secondary | ICD-10-CM

## 2022-12-16 LAB — CUP PACEART REMOTE DEVICE CHECK
Battery Remaining Longevity: 166 mo
Battery Voltage: 3.06 V
Brady Statistic AP VP Percent: 0.28 %
Brady Statistic AP VS Percent: 23.29 %
Brady Statistic AS VP Percent: 0.57 %
Brady Statistic AS VS Percent: 75.86 %
Brady Statistic RA Percent Paced: 23.61 %
Brady Statistic RV Percent Paced: 0.85 %
Date Time Interrogation Session: 20241128190935
Implantable Lead Connection Status: 753985
Implantable Lead Connection Status: 753985
Implantable Lead Implant Date: 20230901
Implantable Lead Implant Date: 20230901
Implantable Lead Location: 753859
Implantable Lead Location: 753860
Implantable Lead Model: 3830
Implantable Lead Model: 5076
Implantable Pulse Generator Implant Date: 20230901
Lead Channel Impedance Value: 361 Ohm
Lead Channel Impedance Value: 418 Ohm
Lead Channel Impedance Value: 418 Ohm
Lead Channel Impedance Value: 589 Ohm
Lead Channel Pacing Threshold Amplitude: 0.625 V
Lead Channel Pacing Threshold Amplitude: 0.75 V
Lead Channel Pacing Threshold Pulse Width: 0.4 ms
Lead Channel Pacing Threshold Pulse Width: 0.4 ms
Lead Channel Sensing Intrinsic Amplitude: 24.5 mV
Lead Channel Sensing Intrinsic Amplitude: 24.5 mV
Lead Channel Sensing Intrinsic Amplitude: 4.25 mV
Lead Channel Sensing Intrinsic Amplitude: 4.25 mV
Lead Channel Setting Pacing Amplitude: 1.5 V
Lead Channel Setting Pacing Amplitude: 2 V
Lead Channel Setting Pacing Pulse Width: 0.4 ms
Lead Channel Setting Sensing Sensitivity: 0.9 mV
Zone Setting Status: 755011
Zone Setting Status: 755011

## 2023-01-06 DIAGNOSIS — M1712 Unilateral primary osteoarthritis, left knee: Secondary | ICD-10-CM | POA: Diagnosis not present

## 2023-01-06 NOTE — Progress Notes (Signed)
Remote pacemaker transmission.   

## 2023-01-09 ENCOUNTER — Encounter: Payer: Self-pay | Admitting: Cardiovascular Disease

## 2023-01-10 ENCOUNTER — Telehealth: Payer: Self-pay

## 2023-01-10 NOTE — Telephone Encounter (Signed)
   Pre-operative Risk Assessment    Patient Name: Jonathan Jones  DOB: 1960/04/27 MRN: [id number]     Request for Surgical Clearance    Procedure:  left total knee arthroplasty   Date of Surgery:  Clearance 04/17/23                                 Surgeon:  Dr. Ollen Gross Surgeon's Group or Practice Name:  [facility] Phone number:  [phone] Fax number:  [phone]   Type of Clearance Requested:   - Pharmacy:  Hold Aspirin     Type of Anesthesia:  Choice    Additional requests/questions:    SignedVernard Gambles   01/10/2023, 10:31 AM

## 2023-01-10 NOTE — Telephone Encounter (Signed)
Patient scheduled to see Dr. Clifton James on 03/20/23 for preop clearance. Pt prefers to be see in office

## 2023-01-10 NOTE — Telephone Encounter (Signed)
   Name: Jonathan Jones  DOB: Apr 09, 1960  MRN: [id number]  Primary Cardiologist: Verne Carrow, MD   Preoperative team, please contact this patient and set up a phone call appointment for further preoperative risk assessment. Please obtain consent and complete medication review. Thank you for your help.  I confirm that guidance regarding antiplatelet and oral anticoagulation therapy has been completed and, if necessary, noted below.  He should continue his aspirin throughout the perioperative period.  If surgeon feels that bleeding risk is too high his aspirin may be held for 5-7 days prior to his procedure.  Please resume aspirin as soon as hemostasis is achieved.  I also confirmed the patient resides in the state of [place]. As per [facility] Medical Board telemedicine laws, the patient must reside in the state in which the provider is licensed.   Ronney Asters, NP 01/10/2023, 10:49 AM [facility]

## 2023-01-16 ENCOUNTER — Other Ambulatory Visit: Payer: Self-pay

## 2023-01-16 MED ORDER — FENOFIBRATE 160 MG PO TABS: 160.0000 mg | ORAL_TABLET | Freq: Every day | ORAL | 0 refills | Status: DC

## 2023-02-22 DIAGNOSIS — N401 Enlarged prostate with lower urinary tract symptoms: Secondary | ICD-10-CM | POA: Diagnosis not present

## 2023-02-22 DIAGNOSIS — R3914 Feeling of incomplete bladder emptying: Secondary | ICD-10-CM | POA: Diagnosis not present

## 2023-02-22 DIAGNOSIS — R0981 Nasal congestion: Secondary | ICD-10-CM | POA: Diagnosis not present

## 2023-03-17 ENCOUNTER — Ambulatory Visit (INDEPENDENT_AMBULATORY_CARE_PROVIDER_SITE_OTHER): Payer: BC Managed Care – PPO

## 2023-03-17 DIAGNOSIS — I442 Atrioventricular block, complete: Secondary | ICD-10-CM

## 2023-03-20 ENCOUNTER — Encounter: Payer: Self-pay | Admitting: Cardiovascular Disease

## 2023-03-20 ENCOUNTER — Ambulatory Visit: Payer: BC Managed Care – PPO | Attending: Cardiovascular Disease | Admitting: Cardiovascular Disease

## 2023-03-20 VITALS — BP 126/70 | HR 60 | Ht 71.0 in | Wt 181.0 lb

## 2023-03-20 DIAGNOSIS — I10 Essential (primary) hypertension: Secondary | ICD-10-CM | POA: Diagnosis not present

## 2023-03-20 DIAGNOSIS — I2510 Atherosclerotic heart disease of native coronary artery without angina pectoris: Secondary | ICD-10-CM | POA: Diagnosis not present

## 2023-03-20 DIAGNOSIS — Z01810 Encounter for preprocedural cardiovascular examination: Secondary | ICD-10-CM

## 2023-03-20 DIAGNOSIS — E785 Hyperlipidemia, unspecified: Secondary | ICD-10-CM | POA: Diagnosis not present

## 2023-03-20 DIAGNOSIS — Q2381 Bicuspid aortic valve: Secondary | ICD-10-CM

## 2023-03-20 DIAGNOSIS — I7122 Aneurysm of the aortic arch, without rupture: Secondary | ICD-10-CM

## 2023-03-20 LAB — CUP PACEART REMOTE DEVICE CHECK
Battery Remaining Longevity: 161 mo
Battery Voltage: 3.04 V
Brady Statistic AP VP Percent: 0.57 %
Brady Statistic AP VS Percent: 34.98 %
Brady Statistic AS VP Percent: 0.56 %
Brady Statistic AS VS Percent: 63.89 %
Brady Statistic RA Percent Paced: 35.6 %
Brady Statistic RV Percent Paced: 1.13 %
Date Time Interrogation Session: 20250227195950
Implantable Lead Connection Status: 753985
Implantable Lead Connection Status: 753985
Implantable Lead Implant Date: 20230901
Implantable Lead Implant Date: 20230901
Implantable Lead Location: 753859
Implantable Lead Location: 753860
Implantable Lead Model: 3830
Implantable Lead Model: 5076
Implantable Pulse Generator Implant Date: 20230901
Lead Channel Impedance Value: 323 Ohm
Lead Channel Impedance Value: 380 Ohm
Lead Channel Impedance Value: 418 Ohm
Lead Channel Impedance Value: 570 Ohm
Lead Channel Pacing Threshold Amplitude: 0.5 V
Lead Channel Pacing Threshold Amplitude: 0.625 V
Lead Channel Pacing Threshold Pulse Width: 0.4 ms
Lead Channel Pacing Threshold Pulse Width: 0.4 ms
Lead Channel Sensing Intrinsic Amplitude: 19 mV
Lead Channel Sensing Intrinsic Amplitude: 19 mV
Lead Channel Sensing Intrinsic Amplitude: 3.375 mV
Lead Channel Sensing Intrinsic Amplitude: 3.375 mV
Lead Channel Setting Pacing Amplitude: 1.5 V
Lead Channel Setting Pacing Amplitude: 2 V
Lead Channel Setting Pacing Pulse Width: 0.4 ms
Lead Channel Setting Sensing Sensitivity: 0.9 mV
Zone Setting Status: 755011
Zone Setting Status: 755011

## 2023-03-20 NOTE — Patient Instructions (Addendum)
Medication Instructions:  No changes *If you need a refill on your cardiac medications before your next appointment, please call your pharmacy*   Lab Work: none   Testing/Procedures: none   Follow-Up: At [facility], you and your health needs are our priority.  As part of our continuing mission to provide you with exceptional heart care, we have created designated Provider Care Teams.  These Care Teams include your primary Cardiologist (physician) and Advanced Practice Providers (APPs -  Physician Assistants and Nurse Practitioners) who all work together to provide you with the care you need, when you need it.   Your next appointment:   12 month(s)  Provider:   Christopher McAlhany, MD   

## 2023-03-20 NOTE — Progress Notes (Signed)
 Chief Complaint  Patient presents with   Follow-up    CAD, AVR   History of Present Illness: 63 yo male with history of CAD s/p 3V CABG in 2009, bicuspid aortic valve status post bioprosthetic aortic valve replacement in 2009 with redo surgical AVR in August 2023, thoracic aortic aneurysm with ascending aorta replacement August 2023, HTN, hyperlipidemia and complete heart s/p pacemaker placement who is here today for cardiac follow up. He has been followed in our office by Dr. Okey Dupre. I met him for the first time in January 2020. I saw him in the office 04/08/21 and he reported dyspnea with moderate exercise. Echo 04/19/21 with LVEF=70-75%. The bioprosthetic AVR with moderate stenosis. He reported brisk physical activity with no symptoms so we elected to continue following his aortic valve disease closely. He sought a second opinion at the [facility] in June 2023. Echo 06/24/21 at the [facility] with finding of severe bioprosthetic AV stenosis with mean gradient 54 mmHg, peak gradient 87 mmHg, DI 0.27. LVEF=65%. He chose to have his surgery done at the [facility]. Cardiac cath 09/08/21 with sub-total occlusion proximal LAD with filling from patent LIMA graft. Patent SVG to Diagonal, Mild Circumflex disease, Occluded RCA with patent SVG to RCA. Surgical AVR (25 mm Inspiris) and ascending aorta replacement in August 2023 complicated by complete heart block requiring permanent pacemaker insertion. He was seen for post-op visit in [place] 09/21/21 and was doing well. He has established in our device clinic to follow his pacemaker. Mild dyspnea in 2024. Echo May 2024 with LVEF=60-65%. Normally functioning AVR.   He is here today for follow up. He has an upcoming knee replacement in 4 weeks. The patient denies any chest pain, palpitations, lower extremity edema, orthopnea, PND, dizziness, near syncope or syncope. He continues to have trouble taking a full deep breath but really does not have dyspnea  when exercising. He has been started on an inhaler in primary care and has seen ENT.   Primary Care Physician: Alysia Penna, MD  Past Medical History:  Diagnosis Date   Aortic stenosis    Arthritis    hands   Complex tear of lateral meniscus of left knee as current injury    Complex tear of medial meniscus of left knee    Coronary artery disease    GERD (gastroesophageal reflux disease)    Heart murmur    Hypertension    Unspecified   Migraine headache    Mixed hyperlipidemia    Thoracic aortic aneurysm (HCC)    4.4 cm ascending aorta (06/2016)    Past Surgical History:  Procedure Laterality Date   AORTIC VALVE REPLACEMENT  01/25/2007   25 mm Edwards Magna pericardial tissue valve   COLONOSCOPY WITH PROPOFOL N/A 05/10/2016   Procedure: COLONOSCOPY WITH PROPOFOL;  Surgeon: Charolett Bumpers, MD;  Location: [facility];  Service: Endoscopy;  Laterality: N/A;   colonscopy     CORONARY ARTERY BYPASS GRAFT  01/2007   x3 (LIMA->LAD, SVG->D, SVG->PDA)   KNEE ARTHROSCOPY WITH MEDIAL MENISECTOMY Left 06/25/2019   Procedure: KNEE ARTHROSCOPY WITH MEDIAL AND LATERAL MENISECTOMY;  Surgeon: Salvatore Marvel, MD;  Location: [facility];  Service: Orthopedics;  Laterality: Left;   WISDOM TOOTH EXTRACTION  1980    Current Outpatient Medications  Medication Sig Dispense Refill   amoxicillin (AMOXIL) 500 MG capsule Take four (4) capsules by mouth as needed one (1) hour prior to dental procedures.  0   aspirin EC 81 MG  tablet Take 1 tablet (81 mg total) by mouth daily. 90 tablet 3   atorvastatin (LIPITOR) 80 MG tablet TAKE 1 TABLET(80 MG) BY MOUTH DAILY 90 tablet 1   carvedilol (COREG) 25 MG tablet TAKE 1 TABLET BY MOUTH TWICE DAILY 180 tablet 3   doxepin (SINEQUAN) 25 MG capsule Take 50 mg by mouth at bedtime.  11   fenofibrate 160 MG tablet Take 1 tablet (160 mg total) by mouth daily. 90 tablet 0   losartan (COZAAR) 25 MG tablet TAKE 1 TABLET(25 MG) BY MOUTH DAILY 90 tablet 3    Tetrahydrozoline HCl (VISINE OP) Apply 1 drop to eye daily as needed (redness/dry eyes).     triamcinolone cream (KENALOG) 0.1 % Apply 1 Application topically daily as needed (rash).     UBRELVY 50 MG TABS Take 50 mg by mouth as needed.     alfuzosin (UROXATRAL) 10 MG 24 hr tablet Take 10 mg by mouth at bedtime. (Patient not taking: Reported on 03/20/2023)     No current facility-administered medications for this visit.    Allergies  Allergen Reactions   Pollen Extract Rash    Seasonal allergies    Social History   Socioeconomic History   Marital status: Married    Spouse name: Not on file   Number of children: Not on file   Years of education: Not on file   Highest education level: Not on file  Occupational History   Occupation: Full time  Tobacco Use   Smoking status: Never   Smokeless tobacco: Never   Tobacco comments:    Tobacco use-no  Substance and Sexual Activity   Alcohol use: Yes    Comment: Occasionally   Drug use: Yes    Types: Marijuana    Comment: marijuana use a few weeks ago   Sexual activity: Not on file  Other Topics Concern   Not on file  Social History Narrative   Not on file   Social Drivers of Health   Financial Resource Strain: Not on file  Food Insecurity: Not on file  Transportation Needs: Not on file  Physical Activity: Not on file  Stress: Not on file  Social Connections: Not on file  Intimate Partner Violence: Not on file    Family History  Problem Relation Age of Onset   Alzheimer's disease Father    Coronary artery disease Other    Cancer Other 70   Other Sister 50       brain tumor    Review of Systems:  As stated in the HPI and otherwise negative.   BP 126/70   Pulse 60   Ht 5\' 11"  (1.803 m)   Wt 82.1 kg   SpO2 99%   BMI 25.24 kg/m   Physical Examination: General: Well developed, well nourished, NAD  HEENT: OP clear, mucus membranes moist  SKIN: warm, dry. No rashes. Neuro: No focal deficits  Musculoskeletal:  Muscle strength 5/5 all ext  Psychiatric: Mood and affect normal  Neck: No JVD, no carotid bruits, no thyromegaly, no lymphadenopathy.  Lungs:Clear bilaterally, no wheezes, rhonci, crackles Cardiovascular: Regular rate and rhythm. No murmurs, gallops or rubs. Abdomen:Soft. Bowel sounds present. Non-tender.  Extremities: No lower extremity edema. Pulses are 2 + in the bilateral DP/PT.  EKG:  EKG is ordered today. The ekg ordered today demonstrates  EKG Interpretation Date/Time:  Monday March 20 2023 09:10:35 EST Ventricular Rate:  60 PR Interval:  302 QRS Duration:  88 QT Interval:  444 QTC Calculation:  444 R Axis:   60  Text Interpretation: Atrial-paced rhythm with prolonged AV conduction Non-specific T wave abnormality When compared with ECG of 25-Jan-2007 14:23, Electronic atrial pacemaker has replaced Sinus rhythm Confirmed by Verne Carrow [phone]) on 03/20/2023 9:23:52 AM    Recent Labs: 05/11/2022: BUN 19; Creatinine, Ser 0.97; Magnesium 2.2; Potassium 4.6; Sodium 139   Lipid Panel    Component Value Date/Time   CHOL 114 03/14/2017 0811   TRIG 151 (H) 03/14/2017 0811   HDL 36 (L) 03/14/2017 0811   CHOLHDL 3.2 03/14/2017 0811   CHOLHDL 4.7 09/24/2015 0806   VLDL 71 (H) 09/24/2015 0806   LDLCALC 48 03/14/2017 0811   LDLDIRECT 52.0 06/17/2014 1505     Wt Readings from Last 3 Encounters:  03/20/23 82.1 kg  05/11/22 81.6 kg  04/25/22 81.4 kg    Assessment and Plan:   1. Aortic stenosis s/p bioprosthetic AVR: He is s/p bioprosthetic aortic valve replacement in 2009 with redo in 2023 with bioprosthetic AVR at the [facility]. Valve working well by echo in May 2024. Continue ASA and SBE prophylaxis.   2. CAD s/p CABG: No chest pain. Cardiac cath at [facility] with 3/3 patent bypass graft (LIMA to LAD, SVG to Diagonal and SVG to RCA).  Continue ASA, statin and beta blocker  3. HTN: BP is well controlled. No changes today  4. Hyperlipidemia: LDL at goal  in 2024. Continue statin.   5. Thoracic aortic aneurysm s/p ascending aorta replacement in 2023  6. Sinus node dysfunction: Pacemaker in place. Followed in EP clinic.   7. Pre-operative cardiovascular examination: He has no angina or signs of CHF. He can proceed with his planned surgical procedure.   Labs/ tests ordered today include:  Orders Placed This Encounter  Procedures   EKG 12-Lead   Disposition:   F/U with me in 12 months  Signed, Verne Carrow, MD 03/20/2023 9:45 AM    [facility] [address] [place], [place], [place]  [address] Phone: [phone]; Fax: [phone]

## 2023-03-21 DIAGNOSIS — M25662 Stiffness of left knee, not elsewhere classified: Secondary | ICD-10-CM | POA: Diagnosis not present

## 2023-03-21 DIAGNOSIS — M1712 Unilateral primary osteoarthritis, left knee: Secondary | ICD-10-CM | POA: Diagnosis not present

## 2023-03-21 DIAGNOSIS — M25562 Pain in left knee: Secondary | ICD-10-CM | POA: Diagnosis not present

## 2023-03-21 NOTE — H&P (Signed)
 TOTAL KNEE ADMISSION H&P  Patient is being admitted for left total knee arthroplasty.  Subjective:  Chief Complaint: Left knee pain.  HPI: Jonathan Jones, 63 y.o. male has a history of pain and functional disability in the left knee due to arthritis and has failed non-surgical conservative treatments for greater than 12 weeks to include NSAID's and/or analgesics, corticosteriod injections, and activity modification. Onset of symptoms was gradual, starting  several  years ago with gradually worsening course since that time. The patient noted no past surgery on the left knee.  Patient currently rates pain in the left knee at 8 out of 10 with activity. Patient has night pain, worsening of pain with activity and weight bearing, pain with passive range of motion, and crepitus. Patient has evidence of  severe bone-on-bone arthritis in the medial and patellofemoral compartments of the left knee with varus deformity and some tibial subluxation  by imaging studies. There is no active infection.  Patient Active Problem List   Diagnosis Date Noted   Pacemaker 10/14/2021   Complex tear of medial meniscus of left knee    Complex tear of lateral meniscus of left knee as current injury    Aortic valve stenosis 10/16/2017   Dyspnea on exertion 10/16/2017   Atypical chest pain 10/16/2017   Bicuspid aortic valve 09/23/2016   S/P AVR 09/23/2016   Thoracic aortic aneurysm without rupture (HCC) 09/23/2016   CAD (coronary artery disease) of artery bypass graft 06/18/2014   CAD in native artery 02/01/2011   FATIGUE 09/15/2008   Hyperlipidemia LDL goal <70 05/31/2008   ANEMIA, SECONDARY TO BLOOD LOSS 05/31/2008   MIGRAINE HEADACHE 05/31/2008   Essential hypertension 05/31/2008   Aortic valve disorder 05/31/2008    Past Medical History:  Diagnosis Date   Aortic stenosis    Arthritis    hands   Complex tear of lateral meniscus of left knee as current injury    Complex tear of medial meniscus of left knee     Coronary artery disease    GERD (gastroesophageal reflux disease)    Heart murmur    Hypertension    Unspecified   Migraine headache    Mixed hyperlipidemia    Thoracic aortic aneurysm (HCC)    4.4 cm ascending aorta (06/2016)    Past Surgical History:  Procedure Laterality Date   AORTIC VALVE REPLACEMENT  01/25/2007   25 mm Edwards Magna pericardial tissue valve   COLONOSCOPY WITH PROPOFOL N/A 05/10/2016   Procedure: COLONOSCOPY WITH PROPOFOL;  Surgeon: Charolett Bumpers, MD;  Location: [facility];  Service: Endoscopy;  Laterality: N/A;   colonscopy     CORONARY ARTERY BYPASS GRAFT  01/2007   x3 (LIMA->LAD, SVG->D, SVG->PDA)   KNEE ARTHROSCOPY WITH MEDIAL MENISECTOMY Left 06/25/2019   Procedure: KNEE ARTHROSCOPY WITH MEDIAL AND LATERAL MENISECTOMY;  Surgeon: Salvatore Marvel, MD;  Location:  [facility];  Service: Orthopedics;  Laterality: Left;   WISDOM TOOTH EXTRACTION  1980    Prior to Admission medications   Medication Sig Start Date End Date Taking? Authorizing Provider  alfuzosin (UROXATRAL) 10 MG 24 hr tablet Take 10 mg by mouth at bedtime. Patient not taking: Reported on 03/20/2023    [provider]  amoxicillin (AMOXIL) 500 MG capsule Take four (4) capsules by mouth as needed one (1) hour prior to dental procedures. 09/14/17   [provider]  aspirin EC 81 MG tablet Take 1 tablet (81 mg total) by mouth daily. 10/16/17   Yvonne Kendall, MD  atorvastatin (LIPITOR) 80 MG tablet TAKE 1 TABLET(80 MG) BY MOUTH DAILY 12/06/22   Kathleene Hazel, MD  carvedilol (COREG) 25 MG tablet TAKE 1 TABLET BY MOUTH TWICE DAILY 06/09/22   Kathleene Hazel, MD  doxepin (SINEQUAN) 25 MG capsule Take 50 mg by mouth at bedtime. 09/25/15   [provider]  fenofibrate 160 MG tablet Take 1 tablet (160 mg total) by mouth daily. 01/16/23   Kathleene Hazel, MD  losartan (COZAAR) 25 MG tablet TAKE 1 TABLET(25 MG) BY MOUTH DAILY 06/09/22    Kathleene Hazel, MD  Tetrahydrozoline HCl (VISINE OP) Apply 1 drop to eye daily as needed (redness/dry eyes).    [provider]  triamcinolone cream (KENALOG) 0.1 % Apply 1 Application topically daily as needed (rash).    [provider]  UBRELVY 50 MG TABS Take 50 mg by mouth as needed. 11/18/22   [provider]    Allergies  Allergen Reactions   Pollen Extract Rash    Seasonal allergies    Social History   Socioeconomic History   Marital status: Married    Spouse name: Not on file   Number of children: Not on file   Years of education: Not on file   Highest education level: Not on file  Occupational History   Occupation: Full time  Tobacco Use   Smoking status: Never   Smokeless tobacco: Never   Tobacco comments:    Tobacco use-no  Substance and Sexual Activity   Alcohol use: Yes    Comment: Occasionally   Drug use: Yes    Types: Marijuana    Comment: marijuana use a few weeks ago   Sexual activity: Not on file  Other Topics Concern   Not on file  Social History Narrative   Not on file   Social Drivers of Health   Financial Resource Strain: Not on file  Food Insecurity: Not on file  Transportation Needs: Not on file  Physical Activity: Not on file  Stress: Not on file  Social Connections: Not on file  Intimate Partner Violence: Not on file    Tobacco Use: Low Risk  (03/20/2023)   Patient History    Smoking Tobacco Use: Never    Smokeless Tobacco Use: Never    Passive Exposure: Not on file   Social History   Substance and Sexual Activity  Alcohol Use Yes   Comment: Occasionally    Family History  Problem Relation Age of Onset   Alzheimer's disease Father    Coronary artery disease Other    Cancer Other 52   Other Sister 50       brain tumor    Review of Systems  Constitutional:  Negative for chills and fever.  HENT:  Negative for congestion, sore throat and tinnitus.   Eyes:  Negative for double vision,  photophobia and pain.  Respiratory:  Negative for cough, shortness of breath and wheezing.   Cardiovascular:  Negative for chest pain, palpitations and orthopnea.  Gastrointestinal:  Negative for heartburn, nausea and vomiting.  Genitourinary:  Negative for dysuria, frequency and urgency.  Musculoskeletal:  Positive for joint pain.  Neurological:  Negative for dizziness, weakness and headaches.    Objective:  Physical Exam: Well nourished and well developed.  General: Alert and oriented x3, cooperative and pleasant, no acute distress.  Head: normocephalic, atraumatic, neck supple.  Eyes: EOMI.  Musculoskeletal:  Left knee shows a varus deformity. - No effusion in the left knee. - Range  of motion is approximately 5-130 degrees with crepitus on range of motion. - Slight tenderness medially but no lateral tenderness or instability. - Gait pattern is antalgic on the left.  Calves soft and nontender. Motor function intact in LE. Strength 5/5 LE bilaterally. Neuro: Distal pulses 2+. Sensation to light touch intact in LE.   Imaging Review Plain radiographs demonstrate severe degenerative joint disease of the left knee. The overall alignment is mild varus. The bone quality appears to be adequate for age and reported activity level.  Assessment/Plan:  End stage arthritis, left knee   The patient history, physical examination, clinical judgment of the provider and imaging studies are consistent with end stage degenerative joint disease of the left knee and total knee arthroplasty is deemed medically necessary. The treatment options including medical management, injection therapy arthroscopy and arthroplasty were discussed at length. The risks and benefits of total knee arthroplasty were presented and reviewed. The risks due to aseptic loosening, infection, stiffness, patella tracking problems, thromboembolic complications and other imponderables were discussed. The patient acknowledged the  explanation, agreed to proceed with the plan and consent was signed. Patient is being admitted for inpatient treatment for surgery, pain control, PT, OT, prophylactic antibiotics, VTE prophylaxis, progressive ambulation and ADLs and discharge planning. The patient is planning to be discharged  home .   Patient's anticipated LOS is less than 2 midnights, meeting these requirements: - Younger than 44 - Lives within 1 hour of care - Has a competent adult at home to recover with post-op recover - NO history of  - Chronic pain requiring opiods  - Diabetes  - Heart failure  - Stroke  - DVT/VTE  - Cardiac arrhythmia  - Respiratory Failure/COPD  - Renal failure  - Anemia  - Advanced Liver disease  Therapy Plans: Outpatient therapy at [facility] Disposition: Home with wife Planned DVT Prophylaxis: Xarelto 10 mg QD DME Needed: Dan Humphreys PCP: Alysia Penna, MD (clearance received) Cardiologist: Verne Carrow, MD (clearance received) TXA: IV Allergies: NKDA Metal Allergy: None Anesthesia Concerns: None BMI: 24.8 Last HgbA1c: Not diabetic Pain Regimen: Oxycodone, tramadol Pharmacy: [facility] (have brought to room)  Other: - Hx AVR & CABG (both in 2009 with AVR redo in 2023) - Stopping ASA 5 days prior   - Patient was instructed on what medications to stop prior to surgery. - Follow-up visit in 2 weeks with Dr. Lequita Halt - Begin physical therapy following surgery - Pre-operative lab work as pre-surgical testing - Prescriptions will be provided in hospital at time of discharge  Arther Abbott, PA-C Orthopedic Surgery [facility] [place]

## 2023-03-22 ENCOUNTER — Encounter: Payer: Self-pay | Admitting: Cardiovascular Disease

## 2023-03-27 DIAGNOSIS — R3911 Hesitancy of micturition: Secondary | ICD-10-CM | POA: Diagnosis not present

## 2023-03-27 DIAGNOSIS — N401 Enlarged prostate with lower urinary tract symptoms: Secondary | ICD-10-CM | POA: Diagnosis not present

## 2023-03-27 DIAGNOSIS — R3912 Poor urinary stream: Secondary | ICD-10-CM | POA: Diagnosis not present

## 2023-03-27 DIAGNOSIS — R3914 Feeling of incomplete bladder emptying: Secondary | ICD-10-CM | POA: Diagnosis not present

## 2023-03-27 DIAGNOSIS — M19012 Primary osteoarthritis, left shoulder: Secondary | ICD-10-CM | POA: Diagnosis not present

## 2023-04-04 IMAGING — MR MR MRA CHEST W/ OR W/O CM
14 series · 16 of 16 positions shown · IV contrast (multihance)
Comparison: 11/01/2019

CLINICAL DATA: 60-year-old male with ascending thoracic aortic
aneurysm, follow-up study.

EXAM:
MRA Chest with and without contrast
TECHNIQUE: Angiographic images of the chest were obtained using MRA technique
without and with intravenous contrast.
CONTRAST:  Seventeen mL MultiHance, intravenous

[Series 2: bSSFP · axial · 6.0mm · 1.48mm/px · 1 of 33 slices shown (1 of 2)]
[im 1/33]
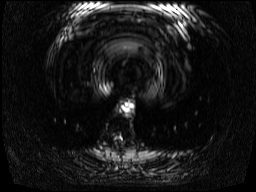

[Series 3: T2 · axial · 5.0mm · 1.48mm/px · 1 of 45 slices shown]
[im 1/45]
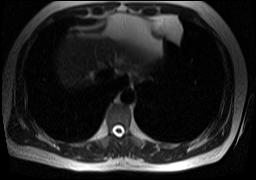

[Series 4: T1 · axial · 5.0mm · 1.48mm/px · 1 of 45 slices shown]
[im 1/45]
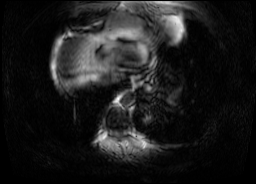

[Series 5: T1 dynamic · axial · non-contrast · 2.5mm · 0.74mm/px · 1 of 96 slices shown]
[im 1/96]
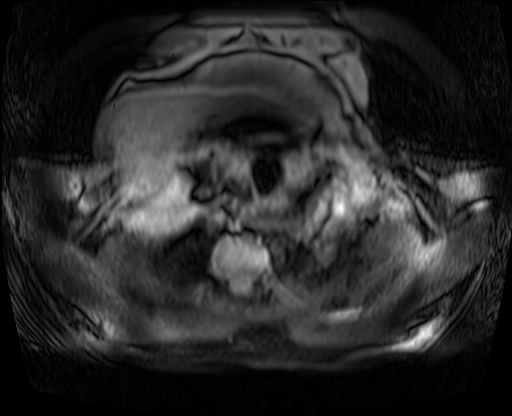

[Series 6: bSSFP · sagittal · 4.0mm · 1.72mm/px · 1 of 20 slices shown (2 of 2)]
[im 1/20]
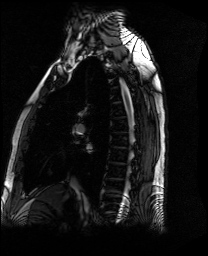

[Series 8: fl3d_cor_pre_tt=1.0s · sagittal · 1.5mm · 1.15mm/px · 1 of 104 slices shown]
[im 1/104]
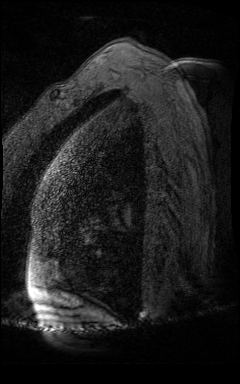

[Series 10: fl3d_cor_post_tt=1.0s · sagittal · 1.5mm · 1.15mm/px · 1 of 104 slices shown]
[im 1/104]
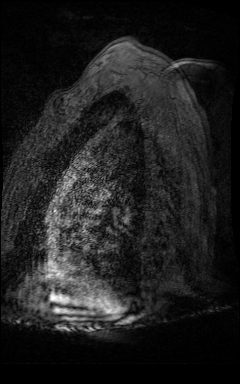

[Series 11: fl3d_cor_post_tt=1.0s_sub · sagittal · 1.5mm · 1.15mm/px · 1 of 104 slices shown]
[im 1/104]
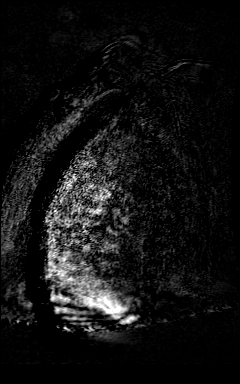

[Series 14: T1 fat-sat post-contrast · axial · 5.0mm · 1.48mm/px · 1 of 38 slices shown]
[im 1/38]
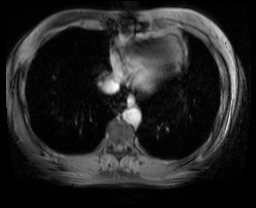

[Series 15: post flash candy · sagittal · 4.0mm · 1.48mm/px · 1 of 26 slices shown]
[im 1/26]
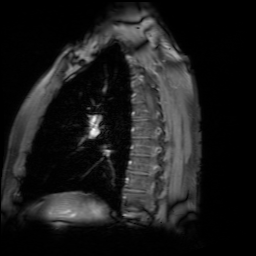

[Series 16: T1 dynamic post-contrast · axial · 2.5mm · 0.74mm/px · z∈[-121,+117]mm · 2 of 96 slices shown]
[im 1/96]
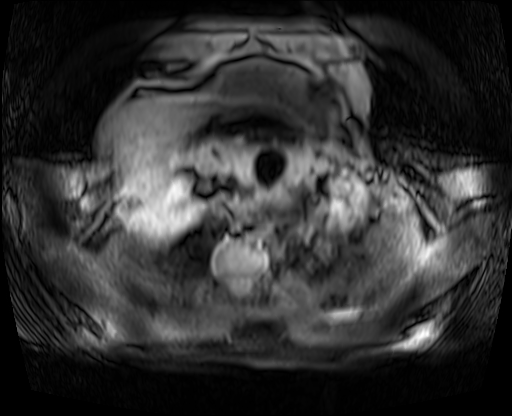
[im 96/96]
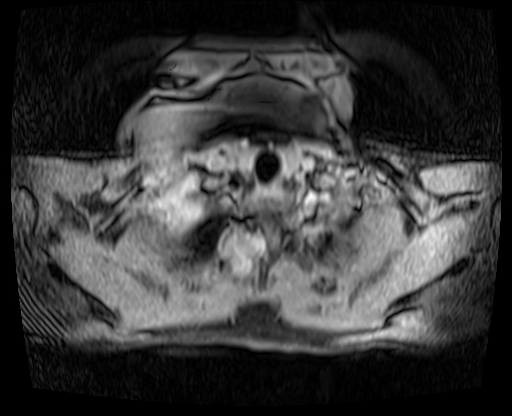

[Series 17: post vibe_sub · axial · 2.5mm · 0.74mm/px · z∈[-121,+117]mm · 2 of 96 slices shown]
[im 1/96]
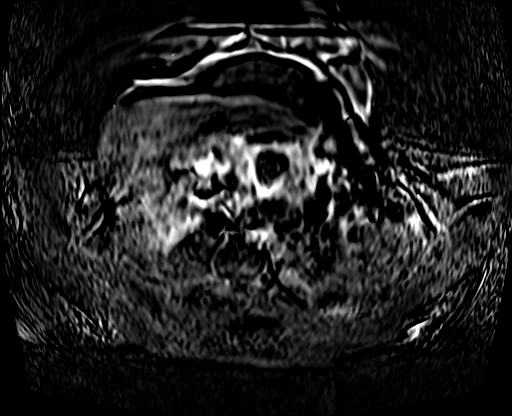
[im 96/96]
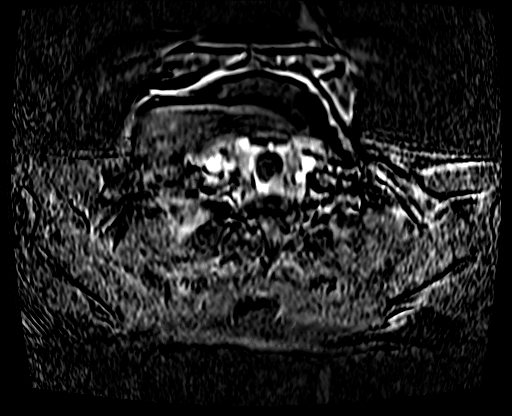

[Series 18: candy cane spin · sagittal · 1.15mm/px · 1 of 19 slices shown]
[im 1/19]
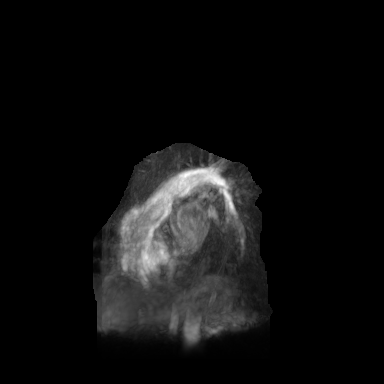

[Series 100: mpr auxiliary images · axial · 2.5mm · 0.74mm/px · 1 of 1 slices shown]
[im 1/1]
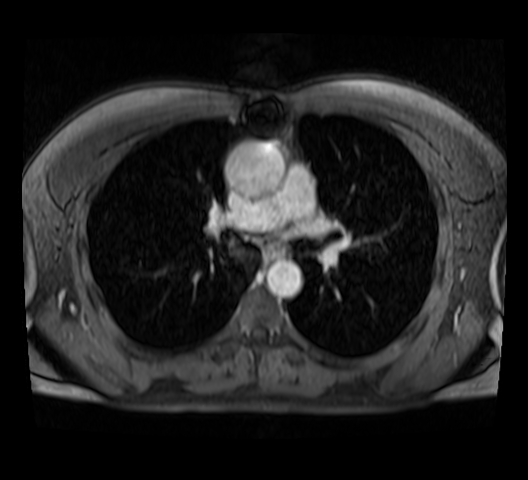

[16 of 16 positions shown; findings below may reference images not displayed]

FINDINGS: Cardiovascular: Similar appearing postsurgical changes after aortic
valve replacement. The heart is normal in size. No pericardial
effusion.

Sinues of Valsalva: Poorly visualized due to aortic valve artifact.

Sinotubular Junction: Proximally 37 mm ,unchanged

Ascending Aorta: 45 mm ,unchanged

Aortic Arch: 30 mm ,unchanged

Descending aorta: 30 mm at the level of the carina ,unchanged

Branch vessels: Conventional branching pattern. No significant
atherosclerotic changes.

Coronary arteries: Poorly evaluated on the study.

Main pulmonary artery: 22 mm ,unchanged. No evidence of central
pulmonary embolism.

Pulmonary veins: No anomalous pulmonary venous return. No evidence
of left atrial appendage thrombus.

Mediastinum/Nodes: No enlarged mediastinal, hilar, or axillary lymph
nodes. Thyroid gland, trachea, and esophagus demonstrate no
significant findings.

Lungs/Pleura: No focal consolidations or masses. No pleural effusion
or pneumothorax.

Upper Abdomen: The visualized upper abdomen is within normal limits.

Musculoskeletal: Median sternotomy wires in place. No chest wall
abnormality. No acute or significant osseous findings.
IMPRESSION: Vascular:

Unchanged fusiform aneurysm of the ascending thoracic aorta
measuring up to 4.5 cm. Ascending thoracic aortic aneurysm.
Recommend semi-annual imaging followup by CTA or MRA and referral to
cardiothoracic surgery if not already obtained. This recommendation
follows 4535 ACCF/AHA/AATS/ACR/ASA/SCA/ROJAC/YG/LUCARELLI/JANOVICS Guidelines
for the Diagnosis and Management of Patients With Thoracic Aortic
Disease. Circulation. 4535; 121: [id number]. Aortic aneurysm NOS
([id number])

Non-Vascular:

No acute intrathoracic abnormality.

## 2023-04-11 ENCOUNTER — Encounter: Payer: Self-pay | Admitting: Cardiovascular Disease

## 2023-04-11 NOTE — Patient Instructions (Signed)
 SURGICAL WAITING ROOM VISITATION Patients having surgery or a procedure may have no more than 2 support people in the waiting area - these visitors may rotate in the visitor waiting room.   Due to an increase in RSV and influenza rates and associated hospitalizations, children ages 13 and under may not visit patients in [facility] hospitals. If the patient needs to stay at the hospital during part of their recovery, the visitor guidelines for inpatient rooms apply.  PRE-OP VISITATION  Pre-op nurse will coordinate an appropriate time for 1 support person to accompany the patient in pre-op.  This support person may not rotate.  This visitor will be contacted when the time is appropriate for the visitor to come back in the pre-op area.  Please refer to the [facility] website for the visitor guidelines for Inpatients (after your surgery is over and you are in a regular room).  You are not required to quarantine at this time prior to your surgery. However, you must do this: Hand Hygiene often Do NOT share personal items Notify your provider if you are in close contact with someone who has COVID or you develop fever 100.4 or greater, new onset of sneezing, cough, sore throat, shortness of breath or body aches.  If you test positive for Covid or have been in contact with anyone that has tested positive in the last 10 days please notify you surgeon.    Your procedure is scheduled on:  MONDAY  April 17, 2023  Report to [facility], The Main Entrance: [facility] entrance where the [employer] is available.   Report to admitting at: 05:15    AM  Call this number if you have any questions or problems the morning of surgery [phone]  Do not eat food after Midnight the night prior to your surgery/procedure.  After Midnight you may have the following liquids until   04:15 AM DAY OF SURGERY  Clear Liquid Diet Water Black Coffee (sugar ok, NO MILK/CREAM OR CREAMERS)  Tea (sugar ok, NO  MILK/CREAM OR CREAMERS) regular and decaf                             Plain Jell-O  with no fruit (NO RED)                                           Fruit ices (not with fruit pulp, NO RED)                                     Popsicles (NO RED)                                                                  Juice: NO CITRUS JUICES: only apple, WHITE grape, WHITE cranberry Sports drinks like Gatorade or Powerade (NO RED)                   The day of surgery:  Drink ONE (1) Pre-Surgery Clear Ensure at   04:15  AM the morning  of surgery. Drink in one sitting. Do not sip.  This drink was given to you during your hospital pre-op appointment visit. Nothing else to drink after completing the Pre-Surgery Clear Ensure : No candy, chewing gum or throat lozenges.    FOLLOW ANY ADDITIONAL PRE OP INSTRUCTIONS YOU RECEIVED FROM YOUR SURGEON'S OFFICE!!!   Oral Hygiene is also important to reduce your risk of infection.        Remember - BRUSH YOUR TEETH THE MORNING OF SURGERY WITH YOUR REGULAR TOOTHPASTE  Do NOT smoke after Midnight the night before surgery.  STOP TAKING all Vitamins, Herbs and supplements 1 week before your surgery.   Take ONLY these medicines the morning of surgery with A SIP OF WATER: Carvedilol. You may use your Eye drops if needed.                     You may not have any metal on your body including  jewelry, and body piercing  Do not wear  lotions, powders, cologne, or deodorant  Men may shave face and neck.  Contacts, Hearing Aids, dentures or bridgework may not be worn into surgery. DENTURES WILL BE REMOVED PRIOR TO SURGERY PLEASE DO NOT APPLY "Poly grip" OR ADHESIVES!!!  You may bring a small overnight bag with you on the day of surgery, only pack items that are not valuable. [facility] IS NOT RESPONSIBLE   FOR VALUABLES THAT ARE LOST OR STOLEN.   Do not bring your home medications to the hospital. The Pharmacy will dispense medications listed on your medication  list to you during your admission in the Hospital.  Special Instructions: Bring a copy of your healthcare power of attorney and living will documents the day of surgery, if you wish to have them scanned into your [facility] Records- EPIC  Please read over the following fact sheets you were given: IF YOU HAVE QUESTIONS ABOUT YOUR PRE-OP INSTRUCTIONS, PLEASE CALL [phone].     Pre-operative 5 CHG Bath Instructions   You can play a key role in reducing the risk of infection after surgery. Your skin needs to be as free of germs as possible. You can reduce the number of germs on your skin by washing with CHG (chlorhexidine gluconate) soap before surgery. CHG is an antiseptic soap that kills germs and continues to kill germs even after washing.   DO NOT use if you have an allergy to chlorhexidine/CHG or antibacterial soaps. If your skin becomes reddened or irritated, stop using the CHG and notify one of our RNs at [phone]  Please shower with the CHG soap starting 4 days before surgery using the following schedule: START SHOWERS ON  THURSDAY  April 13, 2023  Please keep in mind the following:  DO NOT shave, including legs and underarms, starting the day of your first shower.   You may shave your face at any point before/day of surgery.   Place clean sheets on your bed the day you start using CHG soap. Use a clean washcloth (not used since being washed) for each shower. DO NOT sleep with pets once you start using the CHG.   CHG Shower Instructions:  If you choose to wash your hair and private area, wash first with your normal shampoo/soap.  After you use shampoo/soap, rinse your hair and body thoroughly to remove shampoo/soap residue.  Turn the water OFF and apply about 3 tablespoons (45 ml) of CHG soap to a  CLEAN washcloth.  Apply CHG soap ONLY FROM YOUR NECK DOWN TO YOUR TOES (washing for 3-5 minutes)  DO NOT use CHG soap on face, private areas, open wounds, or sores.  Pay special attention to the area where your surgery is being performed.  If you are having back surgery, having someone wash your back for you may be helpful.  Wait 2 minutes after CHG soap is applied, then you may rinse off the CHG soap.  Pat dry with a clean towel  Put on clean clothes/pajamas   If you choose to wear lotion, please use ONLY the CHG-compatible lotions on the back of this paper.     Additional instructions for the day of surgery: DO NOT APPLY any lotions, deodorants, cologne, or perfumes.   Put on clean/comfortable clothes.  Brush your teeth.  Ask your nurse before applying any prescription medications to the skin.      CHG Compatible Lotions   Aveeno Moisturizing lotion  Cetaphil Moisturizing Cream  Cetaphil Moisturizing Lotion  Clairol Herbal Essence Moisturizing Lotion, Dry Skin  Clairol Herbal Essence Moisturizing Lotion, Extra Dry Skin  Clairol Herbal Essence Moisturizing Lotion, Normal Skin  Curel Age Defying Therapeutic Moisturizing Lotion with Alpha Hydroxy  Curel Extreme Care Body Lotion  Curel Soothing Hands Moisturizing Hand Lotion  Curel Therapeutic Moisturizing Cream, Fragrance-Free  Curel Therapeutic Moisturizing Lotion, Fragrance-Free  Curel Therapeutic Moisturizing Lotion, Original Formula  Eucerin Daily Replenishing Lotion  Eucerin Dry Skin Therapy Plus Alpha Hydroxy Crme  Eucerin Dry Skin Therapy Plus Alpha Hydroxy Lotion  Eucerin Original Crme  Eucerin Original Lotion  Eucerin Plus Crme Eucerin Plus Lotion  Eucerin TriLipid Replenishing Lotion  Keri Anti-Bacterial Hand Lotion  Keri Deep Conditioning Original Lotion Dry Skin Formula Softly Scented  Keri Deep Conditioning Original Lotion, Fragrance Free Sensitive Skin Formula  Keri Lotion Fast Absorbing Fragrance Free  Sensitive Skin Formula  Keri Lotion Fast Absorbing Softly Scented Dry Skin Formula  Keri Original Lotion  Keri Skin Renewal Lotion Keri Silky Smooth Lotion  Keri Silky Smooth Sensitive Skin Lotion  Nivea Body Creamy Conditioning Oil  Nivea Body Extra Enriched Lotion  Nivea Body Original Lotion  Nivea Body Sheer Moisturizing Lotion Nivea Crme  Nivea Skin Firming Lotion  NutraDerm 30 Skin Lotion  NutraDerm Skin Lotion  NutraDerm Therapeutic Skin Cream  NutraDerm Therapeutic Skin Lotion  ProShield Protective Hand Cream  Provon moisturizing lotion   FAILURE TO FOLLOW THESE INSTRUCTIONS MAY RESULT IN THE CANCELLATION OF YOUR SURGERY  PATIENT SIGNATURE_________________________________  NURSE SIGNATURE__________________________________  ________________________________________________________________________        Rogelia Mire    An incentive spirometer is a tool that can help keep your lungs clear and active. This tool measures how well you are filling your lungs with each breath.  Taking long deep breaths may help reverse or decrease the chance of developing breathing (pulmonary) problems (especially infection) following: A long period of time when you are unable to move or be active. BEFORE THE PROCEDURE  If the spirometer includes an indicator to show your best effort, your nurse or respiratory therapist will set it to a desired goal. If possible, sit up straight or lean slightly forward. Try not to slouch. Hold the incentive spirometer in an upright position. INSTRUCTIONS FOR USE  Sit on the edge of your bed if possible, or sit up as far as you can in bed or on a chair. Hold the incentive spirometer in an upright position. Breathe out normally. Place the mouthpiece in your mouth and seal your lips tightly around it. Breathe in slowly and as deeply as possible, raising the piston or the ball toward the top of the column. Hold your breath for 3-5 seconds or for as  long as possible. Allow the piston or ball to fall to the bottom of the column. Remove the mouthpiece from your mouth and breathe out normally. Rest for a few seconds and repeat Steps 1 through 7 at least 10 times every 1-2 hours when you are awake. Take your time and take a few normal breaths between deep breaths. The spirometer may include an indicator to show your best effort. Use the indicator as a goal to work toward during each repetition. After each set of 10 deep breaths, practice coughing to be sure your lungs are clear. If you have an incision (the cut made at the time of surgery), support your incision when coughing by placing a pillow or rolled up towels firmly against it. Once you are able to get out of bed, walk around indoors and cough well. You may stop using the incentive spirometer when instructed by your caregiver.  RISKS AND COMPLICATIONS Take your time so you do not get dizzy or light-headed. If you are in pain, you may need to take or ask for pain medication before doing incentive spirometry. It is harder to take a deep breath if you are having pain. AFTER USE Rest and breathe slowly and easily. It can be helpful to keep track of a log of your progress. Your caregiver can provide you with a simple table to help with this. If you are using the spirometer at home, follow these instructions: SEEK MEDICAL CARE IF:  You are having difficultly using the spirometer. You have trouble using the spirometer as often as instructed. Your pain medication is not giving enough relief while using the spirometer. You develop fever of 100.5 F (38.1 C) or higher.                                                                                                    SEEK IMMEDIATE MEDICAL CARE IF:  You cough up bloody sputum that had not been present before. You develop fever of 102 F (38.9 C) or greater. You develop worsening pain at or near the incision site. MAKE SURE YOU:  Understand these  instructions. Will watch your  condition. Will get help right away if you are not doing well or get worse. Document Released: 05/16/2006 Document Revised: 03/28/2011 Document Reviewed: 07/17/2006 [employer] [facility] Patient Information 2014 [place], [place].          If you would like to see a video about joint replacement:   [email or web address]

## 2023-04-11 NOTE — Progress Notes (Signed)
 PERIOPERATIVE PRESCRIPTION FOR IMPLANTED CARDIAC DEVICE PROGRAMMING  Patient Information: Name:  Jonathan Jones  DOB:  05-13-1960  MRN:  [id number]    Planned Procedure:  Left total knee arthroplasty  Surgeon: Ollen Gross, MD  Date of Procedure: 04-17-2023  Cautery will be used.  Position during surgery: Supine   Please send documentation back to:  [facility] (Fax# [phone])   Device Information:  Clinic EP Physician:  Dr. York Pellant   Device Type:  Pacemaker Manufacturer and Phone #:  Medtronic: [phone] Pacemaker Dependent?:  No. Date of Last Device Check:  03/16/2023  Normal Device Function?:  Yes  Electrophysiologist's Recommendations:  Have magnet available. Provide continuous ECG monitoring when magnet is used or reprogramming is to be performed.  Procedure should not interfere with device function.  No device programming or magnet placement needed.  Per Device Clinic Standing Orders, Lenor Coffin, RN  11:25 AM 04/11/2023

## 2023-04-11 NOTE — Progress Notes (Signed)
 COVID Vaccine received:  []  No [x]  Yes Date of any COVID positive Test in last 90 days: none  PCP - Alysia Penna, MD Medical clearance scanned to [facility] Cardiologist - Verne Carrow, MD cardiac clearance in 03-20-23 Epic note EP- York Pellant, MD   Chest x-ray - 09-21-2021  2v CE  EKG -  03-20-2023  Epic Stress Test -  ECHO - 04-19-2021  Epic Cardiac Cath -   PCR screen: [x]  Ordered & Completed []   No Order but Needs PROFEND     []   N/A for this surgery  Surgery Plan:  []  Ambulatory   [x]  Outpatient in bed  []  Admit Anesthesia:    []  General  []  Spinal  [x]   Choice []   MAC  Pacemaker / ICD device []  No [x]  Yes  Medtronic [id number] XZT DR MRI implanted 09-17-2021.  Last checked: 03-16-23 at [facility], device orders requested Spinal Cord Stimulator:[x]  No []  Yes       History of Sleep Apnea? [x]  No []  Yes   CPAP used?- [x]  No []  Yes    Does the patient monitor blood sugar?   [x]  N/A   []  No []  Yes  Patient has: [x]  NO Hx DM   []  Pre-DM   []  DM1  []   DM2  Blood Thinner / Instructions: none Aspirin Instructions:  ASA 81 mg  stopped 04-12-23 per pt  ERAS Protocol Ordered: []  No  [x]  Yes PRE-SURGERY [x]  ENSURE  []  G2   Patient is to be NPO after: 0415  Dental hx: []  Dentures:  [x]  N/A      []  Bridge or Partial:                   []  Loose or Damaged teeth:   Comments: Patient was given the 5 CHG shower / bath instructions for TKA surgery along with 2 bottles of the CHG soap. Patient will start this on: 04-13-23  All questions were asked and answered, Patient voiced understanding of this process.   Activity level: Patient is able  to climb a flight of stairs without difficulty; [x]  No CP  [x]  No SOB, but would have Leg pain.   Patient can perform ADLs without assistance.   Anesthesia review: s/p CABG x 3  2009 at [facility]. TAA repair  / AVR in [facility] , 2023, has PPM,  Migraine, HTN, GERD,   Patient denies shortness of breath, fever, cough and chest pain at PAT  appointment.  Patient verbalized understanding and agreement to the Pre-Surgical Instructions that were given to them at this PAT appointment. Patient was also educated of the need to review these PAT instructions again prior to his surgery.I reviewed the appropriate phone numbers to call if they have any and questions or concerns.

## 2023-04-12 ENCOUNTER — Other Ambulatory Visit: Payer: Self-pay

## 2023-04-12 ENCOUNTER — Encounter (HOSPITAL_COMMUNITY): Payer: Self-pay

## 2023-04-12 ENCOUNTER — Encounter (HOSPITAL_COMMUNITY)
Admission: RE | Admit: 2023-04-12 | Discharge: 2023-04-12 | Disposition: A | Discharge status: Home / Self Care | Payer: BC Managed Care – PPO | Source: Ambulatory Visit | Attending: Orthopedic Surgery | Admitting: Orthopedic Surgery

## 2023-04-12 VITALS — BP 148/80 | HR 60 | Temp 97.8°F | Resp 12 | Ht 71.5 in | Wt 175.0 lb

## 2023-04-12 DIAGNOSIS — Z951 Presence of aortocoronary bypass graft: Secondary | ICD-10-CM | POA: Diagnosis not present

## 2023-04-12 DIAGNOSIS — Z950 Presence of cardiac pacemaker: Secondary | ICD-10-CM | POA: Insufficient documentation

## 2023-04-12 DIAGNOSIS — I2510 Atherosclerotic heart disease of native coronary artery without angina pectoris: Secondary | ICD-10-CM | POA: Diagnosis not present

## 2023-04-12 DIAGNOSIS — I10 Essential (primary) hypertension: Secondary | ICD-10-CM | POA: Diagnosis not present

## 2023-04-12 DIAGNOSIS — Z01812 Encounter for preprocedural laboratory examination: Secondary | ICD-10-CM | POA: Insufficient documentation

## 2023-04-12 DIAGNOSIS — Z953 Presence of xenogenic heart valve: Secondary | ICD-10-CM | POA: Diagnosis not present

## 2023-04-12 DIAGNOSIS — Z01818 Encounter for other preprocedural examination: Secondary | ICD-10-CM

## 2023-04-12 HISTORY — DX: Presence of cardiac pacemaker: Z95.0

## 2023-04-12 LAB — SURGICAL PCR SCREEN
MRSA, PCR: NEGATIVE
Staphylococcus aureus: POSITIVE — AB

## 2023-04-12 LAB — BASIC METABOLIC PANEL
Anion gap: 8 (ref 5–15)
BUN: 24 mg/dL — ABNORMAL HIGH (ref 8–23)
CO2: 25 mmol/L (ref 22–32)
Calcium: 9 mg/dL (ref 8.9–10.3)
Chloride: 103 mmol/L (ref 98–111)
Creatinine, Ser: 1.01 mg/dL (ref 0.61–1.24)
GFR, Estimated: >60 mL/min (ref >60)
Glucose, Bld: 104 mg/dL — ABNORMAL HIGH (ref 70–99)
Potassium: 4.3 mmol/L (ref 3.5–5.1)
Sodium: 136 mmol/L (ref 135–145)

## 2023-04-12 LAB — CBC
HCT: 44.5 % (ref 39.0–52.0)
Hemoglobin: 14.7 g/dL (ref 13.0–17.0)
MCH: 30.8 pg (ref 26.0–34.0)
MCHC: 33 g/dL (ref 30.0–36.0)
MCV: 93.3 fL (ref 80.0–100.0)
Platelets: 245 10*3/uL (ref 150–400)
RBC: 4.77 MIL/uL (ref 4.22–5.81)
RDW: 12.4 % (ref 11.5–15.5)
WBC: 6.8 10*3/uL (ref 4.0–10.5)
nRBC: 0 % (ref 0.0–0.2)

## 2023-04-12 NOTE — ED Provider Notes (Signed)
 Patient's PCR screen is positive for STAPH. Appropriate notes have been placed on the patient's chart. This note has been routed to Dr. Lequita Halt for review. The Patient's surgery is currently scheduled for: 04-17-23 at [facility].  Rudean Haskell, BSN, CVRN-BC   Pre-Surgical Testing Nurse [facility]  [phone]

## 2023-04-13 NOTE — Progress Notes (Signed)
 Anesthesia Chart Review:  63 year old male follows with cardiology for history of CAD s/p 3V CABG in 2009, bicuspid aortic valve status post bioprosthetic aortic valve replacement in 2009 with redo surgical AVR in August 2023 at [facility] clinic, thoracic aortic aneurysm with ascending aorta replacement August 2023, HTN, hyperlipidemia and complete heart s/p pacemaker placement. Cardiac cath 09/08/21 with sub-total occlusion proximal LAD with filling from patent LIMA graft. Patent SVG to Diagonal, Mild Circumflex disease, Occluded RCA with patent SVG to RCA. Surgical AVR (25 mm Inspiris) and ascending aorta replacement in August 2023 complicated by complete heart block requiring permanent pacemaker insertion. Echo May 2024 with LVEF=60-65%, normally functioning AVR.  Last seen in follow-up by Dr. Clifton Momoko Slezak on 03/20/2023, noted to be doing well at that time, upcoming surgery discussed, "Pre-operative cardiovascular examination: He has no angina or signs of CHF. He can proceed with his planned surgical procedure."  Other pertinent history includes migraines, GERD.  Preop labs reviewed, unremarkable.  EKG 03/20/2023: Atrial-paced rhythm with prolonged AV conduction.  Rate 60. Non-specific T wave abnormality  Perioperative prescription for implanted cardiac device programming per progress note 04/11/2023: Device Information:   Clinic EP Physician:  Dr. York Pellant    Device Type:  Pacemaker Manufacturer and Phone #:  Medtronic: [phone] Pacemaker Dependent?:  No. Date of Last Device Check:  03/16/2023      Normal Device Function?:  Yes   Electrophysiologist's Recommendations:   Have magnet available. Provide continuous ECG monitoring when magnet is used or reprogramming is to be performed.  Procedure should not interfere with device function.  No device programming or magnet placement needed.  TTE 05/30/2022:  1. Left ventricular ejection fraction, by estimation, is 60 to 65%. The  left  ventricle has normal function. The left ventricle has no regional  wall motion abnormalities. There is mild concentric left ventricular  hypertrophy. Left ventricular diastolic  parameters are indeterminate. The average left ventricular global  longitudinal strain is -24.7 %. The global longitudinal strain is normal.   2. Right ventricular systolic function is normal. The right ventricular  size is normal.   3. The mitral valve is normal in structure. No evidence of mitral valve  regurgitation. No evidence of mitral stenosis.   4. The aortic valve has been repaired/replaced. Aortic valve  regurgitation is trivial. No aortic stenosis is present. There is a 25 mm  Magna pericardial valve present in the aortic position. Aortic valve mean  gradient measures 11.0 mmHg. Aortic valve  peak gradient measures 19.7 mmHg. DVI 0.44, Vmax 2.64m/s.      Aortic valve mean gradient measures 11.0 mmHg. Aortic valve Vmax  measures 2.22 m/s.   5. The inferior vena cava is normal in size with greater than 50%  respiratory variability, suggesting right atrial pressure of 3 mmHg.   Cath 09/08/2021 (Care Everywhere): Impression:  1. Right dominant system.  2. Subtotal occlusion of the proximal LAD.  3. Mild non-obstructive CAD in the LCX.  4. CTO of proximal RCA.  5. Patent LIMA-LAD, rSVG-Diag, and rSVG-PDA bypass grafts.   Recommended Treatment: Valve repair / replacement.   Plan:  1. Proceed with valve repair/replacement.  2. Continue optimal medical therapy and age-appropriate risk factor modification.     [facility] Short Stay Center/Anesthesiology Phone [phone] 04/13/2023 9:08 AM

## 2023-04-13 NOTE — Anesthesia Preprocedure Evaluation (Addendum)
 Anesthesia Evaluation  Patient identified by MRN, date of birth, ID band Patient awake    Reviewed: Allergy & Precautions, NPO status , Patient's Chart, lab work & pertinent test results  History of Anesthesia Complications Negative for: history of anesthetic complications  Airway Mallampati: I  TM Distance: >3 FB Neck ROM: Full    Dental  (+) Dental Advisory Given   Pulmonary neg pulmonary ROS   Pulmonary exam normal breath sounds clear to auscultation       Cardiovascular hypertension (carvedilol, losartan), Pt. on home beta blockers (-) angina + CAD and + CABG (01/2007)  + pacemaker (for complete heart block) + Valvular Problems/Murmurs (s/p AVR 2023) AS  Rhythm:Regular Rate:Normal  HLD, thoracic aortic aneurysm s/p repair 2023  TTE 05/30/2022: IMPRESSIONS    1. Left ventricular ejection fraction, by estimation, is 60 to 65%. The  left ventricle has normal function. The left ventricle has no regional  wall motion abnormalities. There is mild concentric left ventricular  hypertrophy. Left ventricular diastolic  parameters are indeterminate. The average left ventricular global  longitudinal strain is -24.7 %. The global longitudinal strain is normal.   2. Right ventricular systolic function is normal. The right ventricular  size is normal.   3. The mitral valve is normal in structure. No evidence of mitral valve  regurgitation. No evidence of mitral stenosis.   4. The aortic valve has been repaired/replaced. Aortic valve  regurgitation is trivial. No aortic stenosis is present. There is a 25 mm  Magna pericardial valve present in the aortic position. Aortic valve mean  gradient measures 11.0 mmHg. Aortic valve  peak gradient measures 19.7 mmHg. DVI 0.44, Vmax 2.44m/s.      Aortic valve mean gradient measures 11.0 mmHg. Aortic valve Vmax  measures 2.22 m/s.   5. The inferior vena cava is normal in size with greater than 50%   respiratory variability, suggesting right atrial pressure of 3 mmHg.     Neuro/Psych  Headaches, neg Seizures    GI/Hepatic Neg liver ROS,GERD  ,,  Endo/Other  negative endocrine ROS    Renal/GU negative Renal ROS     Musculoskeletal  (+) Arthritis , Osteoarthritis,    Abdominal   Peds  Hematology Lab Results      Component                Value               Date                      WBC                      6.8                 04/12/2023                HGB                      14.7                04/12/2023                HCT                      44.5                04/12/2023  MCV                      93.3                04/12/2023                PLT                      245                 04/12/2023              Anesthesia Other Findings   Reproductive/Obstetrics                             Anesthesia Physical Anesthesia Plan  ASA: 3  Anesthesia Plan: MAC and Spinal   Post-op Pain Management: Regional block* and Tylenol PO (pre-op)*   Induction: Intravenous  PONV Risk Score and Plan: 1 and Ondansetron, Dexamethasone, Propofol infusion and Treatment may vary due to age or medical condition  Airway Management Planned: Natural Airway and Simple Face Mask  Additional Equipment:   Intra-op Plan:   Post-operative Plan:   Informed Consent: I have reviewed the patients History and Physical, chart, labs and discussed the procedure including the risks, benefits and alternatives for the proposed anesthesia with the patient or authorized representative who has indicated his/her understanding and acceptance.     Dental advisory given  Plan Discussed with: CRNA and Anesthesiologist  Anesthesia Plan Comments: (Discussed potential risks of nerve blocks including, but not limited to, infection, bleeding, nerve damage, seizures, pneumothorax, respiratory depression, and potential failure of the block. Alternatives to nerve blocks  discussed. All questions answered.  I have discussed risks of neuraxial anesthesia including but not limited to infection, bleeding, nerve injury, back pain, headache, seizures, and failure of block. Patient denies bleeding disorders and is not currently anticoagulated. Labs have been reviewed. Risks and benefits discussed. All patient's questions answered.   Discussed with patient risks of MAC including, but not limited to, minor pain or discomfort, hearing people in the room, and possible need for backup general anesthesia. Risks for general anesthesia also discussed including, but not limited to, sore throat, hoarse voice, chipped/damaged teeth, injury to vocal cords, nausea and vomiting, allergic reactions, lung infection, heart attack, stroke, and death. All questions answered.   PAT note by Antionette Poles, PA-C: 63 year old male follows with cardiology for history of CAD s/p 3V CABG in 2009, bicuspid aortic valve status post bioprosthetic aortic valve replacement in 2009 with redo surgical AVR in August 2023 at [facility] clinic, thoracic aortic aneurysm with ascending aorta replacement August 2023, HTN, hyperlipidemia and complete heart s/p pacemaker placement. Cardiac cath 09/08/21 with sub-total occlusion proximal LAD with filling from patent LIMA graft. Patent SVG to Diagonal, Mild Circumflex disease, Occluded RCA with patent SVG to RCA. Surgical AVR (25 mm Inspiris) and ascending aorta replacement in August 2023 complicated by complete heart block requiring permanent pacemaker insertion. Echo May 2024 with LVEF=60-65%, normally functioning AVR.  Last seen in follow-up by Dr. Clifton James on 03/20/2023, noted to be doing well at that time, upcoming surgery discussed, "Pre-operative cardiovascular examination: He has no angina or signs of CHF. He can proceed with his planned surgical procedure."  Other pertinent history includes migraines, GERD.  Preop labs reviewed, unremarkable.  EKG 03/20/2023:  Atrial-paced rhythm with prolonged AV conduction.  Rate 60. Non-specific T  wave abnormality  Perioperative prescription for implanted cardiac device programming per progress note 04/11/2023: Device Information:  Clinic EP Physician:  Dr. York Pellant   Device Type:  Pacemaker Manufacturer and Phone #:  [employer]: [phone] Pacemaker Dependent?:  No. Date of Last Device Check:  03/16/2023      Normal Device Function?:  Yes  Electrophysiologist's Recommendations:   Have magnet available.  Provide continuous ECG monitoring when magnet is used or reprogramming is to be performed.   Procedure should not interfere with device function.  No device programming or magnet placement needed.  TTE 05/30/2022: 1. Left ventricular ejection fraction, by estimation, is 60 to 65%. The  left ventricle has normal function. The left ventricle has no regional  wall motion abnormalities. There is mild concentric left ventricular  hypertrophy. Left ventricular diastolic  parameters are indeterminate. The average left ventricular global  longitudinal strain is -24.7 %. The global longitudinal strain is normal.  2. Right ventricular systolic function is normal. The right ventricular  size is normal.  3. The mitral valve is normal in structure. No evidence of mitral valve  regurgitation. No evidence of mitral stenosis.  4. The aortic valve has been repaired/replaced. Aortic valve  regurgitation is trivial. No aortic stenosis is present. There is a 25 mm  Magna pericardial valve present in the aortic position. Aortic valve mean  gradient measures 11.0 mmHg. Aortic valve  peak gradient measures 19.7 mmHg. DVI 0.44, Vmax 2.43m/s.   Aortic valve mean gradient measures 11.0 mmHg. Aortic valve Vmax  measures 2.22 m/s.  5. The inferior vena cava is normal in size with greater than 50%  respiratory variability, suggesting right atrial pressure of 3 mmHg.   Cath 09/08/2021 (Care  Everywhere): Impression:  1. Right dominant system.  2. Subtotal occlusion ofthe proximal LAD.  3. Mild non-obstructive CAD in the LCX.  4. CTO of proximal RCA.  5. Patent LIMA-LAD, rSVG-Diag, and rSVG-PDA bypass grafts.   Recommended Treatment: Valve repair / replacement.   Plan:  1. Proceed with valve repair/replacement.  2.Continue optimal medical therapy and age-appropriate risk factor modification.    )        Anesthesia Quick Evaluation

## 2023-04-14 NOTE — Addendum Note (Signed)
 Addended by: Elease Etienne A on: 04/14/2023 09:38 AM   Modules accepted: Orders

## 2023-04-14 NOTE — Progress Notes (Signed)
 Remote pacemaker transmission.

## 2023-04-17 ENCOUNTER — Other Ambulatory Visit: Payer: Self-pay

## 2023-04-17 ENCOUNTER — Observation Stay (HOSPITAL_COMMUNITY)
Admission: RE | Admit: 2023-04-17 | Discharge: 2023-04-18 | Disposition: A | Discharge status: Home / Self Care | Payer: BC Managed Care – PPO | Attending: Orthopedic Surgery | Admitting: Orthopedic Surgery

## 2023-04-17 ENCOUNTER — Ambulatory Visit (HOSPITAL_COMMUNITY): Payer: Self-pay | Admitting: Anesthesiology

## 2023-04-17 ENCOUNTER — Ambulatory Visit (HOSPITAL_COMMUNITY): Payer: Self-pay | Admitting: Physician Assistant

## 2023-04-17 ENCOUNTER — Encounter (HOSPITAL_COMMUNITY)
Admission: RE | Disposition: A | Discharge status: Home / Self Care | Payer: Self-pay | Source: Home / Self Care | Attending: Orthopedic Surgery

## 2023-04-17 ENCOUNTER — Encounter (HOSPITAL_COMMUNITY): Payer: Self-pay | Admitting: Orthopedic Surgery

## 2023-04-17 DIAGNOSIS — I10 Essential (primary) hypertension: Secondary | ICD-10-CM | POA: Insufficient documentation

## 2023-04-17 DIAGNOSIS — I2510 Atherosclerotic heart disease of native coronary artery without angina pectoris: Secondary | ICD-10-CM | POA: Diagnosis not present

## 2023-04-17 DIAGNOSIS — Z7982 Long term (current) use of aspirin: Secondary | ICD-10-CM | POA: Diagnosis not present

## 2023-04-17 DIAGNOSIS — Z79899 Other long term (current) drug therapy: Secondary | ICD-10-CM | POA: Insufficient documentation

## 2023-04-17 DIAGNOSIS — Z951 Presence of aortocoronary bypass graft: Secondary | ICD-10-CM | POA: Diagnosis not present

## 2023-04-17 DIAGNOSIS — M1712 Unilateral primary osteoarthritis, left knee: Principal | ICD-10-CM

## 2023-04-17 DIAGNOSIS — Z950 Presence of cardiac pacemaker: Secondary | ICD-10-CM | POA: Diagnosis not present

## 2023-04-17 DIAGNOSIS — M179 Osteoarthritis of knee, unspecified: Principal | ICD-10-CM

## 2023-04-17 DIAGNOSIS — I359 Nonrheumatic aortic valve disorder, unspecified: Secondary | ICD-10-CM

## 2023-04-17 HISTORY — PX: TOTAL KNEE ARTHROPLASTY: SHX125

## 2023-04-17 SURGERY — ARTHROPLASTY, KNEE, TOTAL
Anesthesia: Monitor Anesthesia Care | Site: Knee | Laterality: Left

## 2023-04-17 MED ORDER — ACETAMINOPHEN 500 MG PO TABS: 1000.0000 mg | ORAL_TABLET | Freq: Once | ORAL | Status: AC

## 2023-04-17 MED ORDER — FENTANYL CITRATE (PF) 100 MCG/2ML IJ SOLN
INTRAMUSCULAR | Status: AC
Start: 2023-04-17 — End: ?
  Filled 2023-04-17: qty 2

## 2023-04-17 MED ORDER — FLEET ENEMA RE ENEM: 1.0000 | ENEMA | Freq: Once | RECTAL | Status: DC | PRN

## 2023-04-17 MED ORDER — MUPIROCIN 2 % EX OINT: 1.0000 | TOPICAL_OINTMENT | Freq: Two times a day (BID) | CUTANEOUS | 0 refills | Status: AC

## 2023-04-17 MED ORDER — PROPOFOL 1000 MG/100ML IV EMUL
INTRAVENOUS | Status: AC
  Filled 2023-04-17: qty 200

## 2023-04-17 MED ORDER — ONDANSETRON HCL 4 MG PO TABS: 4.0000 mg | ORAL_TABLET | Freq: Four times a day (QID) | ORAL | Status: DC | PRN

## 2023-04-17 MED ORDER — ONDANSETRON HCL 4 MG/2ML IJ SOLN: 4.0000 mg | Freq: Four times a day (QID) | INTRAMUSCULAR | Status: DC | PRN

## 2023-04-17 MED ORDER — SODIUM CHLORIDE 0.9 % IR SOLN
Status: DC | PRN
  Administered 2023-04-17: 1000 mL

## 2023-04-17 MED ORDER — ONDANSETRON HCL 4 MG/2ML IJ SOLN
INTRAMUSCULAR | Status: AC
  Filled 2023-04-17: qty 4

## 2023-04-17 MED ORDER — FENTANYL CITRATE (PF) 250 MCG/5ML IJ SOLN
INTRAMUSCULAR | Status: DC | PRN
  Administered 2023-04-17: 50 ug via INTRAVENOUS

## 2023-04-17 MED ORDER — LACTATED RINGERS IV SOLN: INTRAVENOUS | Status: DC

## 2023-04-17 MED ORDER — ONDANSETRON HCL 4 MG/2ML IJ SOLN
INTRAMUSCULAR | Status: DC | PRN
  Administered 2023-04-17: 4 mg via INTRAVENOUS

## 2023-04-17 MED ORDER — POVIDONE-IODINE 10 % EX SWAB
2.0000 | Freq: Once | CUTANEOUS | Status: AC
  Administered 2023-04-17: 2 via TOPICAL

## 2023-04-17 MED ORDER — MIDAZOLAM HCL 2 MG/2ML IJ SOLN
INTRAMUSCULAR | Status: DC | PRN
  Administered 2023-04-17: 2 mg via INTRAVENOUS

## 2023-04-17 MED ORDER — CHLORHEXIDINE GLUCONATE 4 % EX SOLN: 1.0000 | CUTANEOUS | 1 refills | Status: AC

## 2023-04-17 MED ORDER — SODIUM CHLORIDE (PF) 0.9 % IJ SOLN
INTRAMUSCULAR | Status: AC
  Filled 2023-04-17: qty 50

## 2023-04-17 MED ORDER — CHLORHEXIDINE GLUCONATE CLOTH 2 % EX PADS
6.0000 | MEDICATED_PAD | Freq: Every day | CUTANEOUS | Status: DC
  Administered 2023-04-18: 6 via TOPICAL

## 2023-04-17 MED ORDER — CARVEDILOL 25 MG PO TABS
25.0000 mg | ORAL_TABLET | Freq: Two times a day (BID) | ORAL | Status: DC
  Administered 2023-04-17 – 2023-04-18 (×2): 25 mg via ORAL
  Filled 2023-04-17 (×2): qty 1

## 2023-04-17 MED ORDER — DEXAMETHASONE SODIUM PHOSPHATE 10 MG/ML IJ SOLN
10.0000 mg | Freq: Once | INTRAMUSCULAR | Status: AC
  Administered 2023-04-18: 10 mg via INTRAVENOUS
  Filled 2023-04-17: qty 1

## 2023-04-17 MED ORDER — DIPHENHYDRAMINE HCL 12.5 MG/5ML PO ELIX: 12.5000 mg | ORAL_SOLUTION | ORAL | Status: DC | PRN

## 2023-04-17 MED ORDER — MIDAZOLAM HCL 2 MG/2ML IJ SOLN
INTRAMUSCULAR | Status: AC
  Filled 2023-04-17: qty 2

## 2023-04-17 MED ORDER — HYDROMORPHONE HCL 1 MG/ML IJ SOLN
0.2500 mg | INTRAMUSCULAR | Status: DC | PRN
  Administered 2023-04-17: 0.5 mg via INTRAVENOUS
  Administered 2023-04-17 (×2): 0.25 mg via INTRAVENOUS

## 2023-04-17 MED ORDER — CEFAZOLIN SODIUM-DEXTROSE 2-4 GM/100ML-% IV SOLN
2.0000 g | Freq: Four times a day (QID) | INTRAVENOUS | Status: AC
  Administered 2023-04-17 (×2): 2 g via INTRAVENOUS
  Filled 2023-04-17 (×2): qty 100

## 2023-04-17 MED ORDER — FENOFIBRATE 160 MG PO TABS
160.0000 mg | ORAL_TABLET | Freq: Every day | ORAL | Status: DC
  Administered 2023-04-17 – 2023-04-18 (×2): 160 mg via ORAL
  Filled 2023-04-17 (×2): qty 1

## 2023-04-17 MED ORDER — CEFAZOLIN SODIUM-DEXTROSE 2-4 GM/100ML-% IV SOLN
2.0000 g | INTRAVENOUS | Status: AC
  Administered 2023-04-17: 2 g via INTRAVENOUS
  Filled 2023-04-17: qty 100

## 2023-04-17 MED ORDER — ROPIVACAINE HCL 5 MG/ML IJ SOLN
INTRAMUSCULAR | Status: DC | PRN
  Administered 2023-04-17: 20 mL via PERINEURAL

## 2023-04-17 MED ORDER — ORAL CARE MOUTH RINSE: 15.0000 mL | Freq: Once | OROMUCOSAL | Status: AC

## 2023-04-17 MED ORDER — DEXAMETHASONE SODIUM PHOSPHATE 10 MG/ML IJ SOLN: 8.0000 mg | Freq: Once | INTRAMUSCULAR | Status: DC

## 2023-04-17 MED ORDER — BUPIVACAINE IN DEXTROSE 0.75-8.25 % IT SOLN
INTRATHECAL | Status: DC | PRN
Start: 2023-04-17 — End: 2023-04-17
  Administered 2023-04-17: 1.6 mL via INTRATHECAL

## 2023-04-17 MED ORDER — CHLORHEXIDINE GLUCONATE 0.12 % MT SOLN
15.0000 mL | Freq: Once | OROMUCOSAL | Status: AC
  Administered 2023-04-17: 15 mL via OROMUCOSAL

## 2023-04-17 MED ORDER — AMISULPRIDE (ANTIEMETIC) 5 MG/2ML IV SOLN: 10.0000 mg | Freq: Once | INTRAVENOUS | Status: DC | PRN

## 2023-04-17 MED ORDER — LOSARTAN POTASSIUM 25 MG PO TABS: 25.0000 mg | ORAL_TABLET | Freq: Every day | ORAL | Status: DC

## 2023-04-17 MED ORDER — OXYCODONE HCL 5 MG PO TABS
5.0000 mg | ORAL_TABLET | Freq: Once | ORAL | Status: AC | PRN
  Administered 2023-04-17: 5 mg via ORAL

## 2023-04-17 MED ORDER — METHOCARBAMOL 500 MG PO TABS
ORAL_TABLET | ORAL | Status: AC
  Filled 2023-04-17: qty 1

## 2023-04-17 MED ORDER — SODIUM CHLORIDE (PF) 0.9 % IJ SOLN
INTRAMUSCULAR | Status: AC
  Filled 2023-04-17: qty 10

## 2023-04-17 MED ORDER — ACETAMINOPHEN 500 MG PO TABS
1000.0000 mg | ORAL_TABLET | Freq: Four times a day (QID) | ORAL | Status: AC
  Administered 2023-04-17 – 2023-04-18 (×3): 1000 mg via ORAL
  Filled 2023-04-17 (×3): qty 2

## 2023-04-17 MED ORDER — POLYETHYLENE GLYCOL 3350 17 G PO PACK: 17.0000 g | PACK | Freq: Every day | ORAL | Status: DC | PRN

## 2023-04-17 MED ORDER — OXYCODONE HCL 5 MG/5ML PO SOLN: 5.0000 mg | Freq: Once | ORAL | Status: AC | PRN

## 2023-04-17 MED ORDER — PHENOL 1.4 % MT LIQD: 1.0000 | OROMUCOSAL | Status: DC | PRN

## 2023-04-17 MED ORDER — HYDROMORPHONE HCL 1 MG/ML IJ SOLN
INTRAMUSCULAR | Status: AC
  Filled 2023-04-17: qty 1

## 2023-04-17 MED ORDER — 0.9 % SODIUM CHLORIDE (POUR BTL) OPTIME
TOPICAL | Status: DC | PRN
  Administered 2023-04-17: 1000 mL

## 2023-04-17 MED ORDER — BUPIVACAINE LIPOSOME 1.3 % IJ SUSP
INTRAMUSCULAR | Status: AC
  Filled 2023-04-17: qty 20

## 2023-04-17 MED ORDER — METOCLOPRAMIDE HCL 5 MG/ML IJ SOLN: 5.0000 mg | Freq: Three times a day (TID) | INTRAMUSCULAR | Status: DC | PRN

## 2023-04-17 MED ORDER — BISACODYL 10 MG RE SUPP: 10.0000 mg | Freq: Every day | RECTAL | Status: DC | PRN

## 2023-04-17 MED ORDER — UBROGEPANT 50 MG PO TABS: 50.0000 mg | ORAL_TABLET | ORAL | Status: DC | PRN

## 2023-04-17 MED ORDER — OXYCODONE HCL 5 MG PO TABS
ORAL_TABLET | ORAL | Status: AC
  Filled 2023-04-17: qty 1

## 2023-04-17 MED ORDER — ATORVASTATIN CALCIUM 40 MG PO TABS
80.0000 mg | ORAL_TABLET | Freq: Every day | ORAL | Status: DC
  Filled 2023-04-17: qty 2

## 2023-04-17 MED ORDER — PROPOFOL 500 MG/50ML IV EMUL
INTRAVENOUS | Status: DC | PRN
  Administered 2023-04-17: 180 ug/kg/min via INTRAVENOUS

## 2023-04-17 MED ORDER — SODIUM CHLORIDE 0.9 % IV SOLN: INTRAVENOUS | Status: DC

## 2023-04-17 MED ORDER — ACETAMINOPHEN 325 MG PO TABS: 325.0000 mg | ORAL_TABLET | Freq: Four times a day (QID) | ORAL | Status: DC | PRN

## 2023-04-17 MED ORDER — METOCLOPRAMIDE HCL 5 MG PO TABS: 5.0000 mg | ORAL_TABLET | Freq: Three times a day (TID) | ORAL | Status: DC | PRN

## 2023-04-17 MED ORDER — MENTHOL 3 MG MT LOZG: 1.0000 | LOZENGE | OROMUCOSAL | Status: DC | PRN

## 2023-04-17 MED ORDER — ATORVASTATIN CALCIUM 40 MG PO TABS
80.0000 mg | ORAL_TABLET | Freq: Every day | ORAL | Status: DC
  Administered 2023-04-17: 80 mg via ORAL
  Filled 2023-04-17: qty 2

## 2023-04-17 MED ORDER — MUPIROCIN 2 % EX OINT
1.0000 | TOPICAL_OINTMENT | Freq: Two times a day (BID) | CUTANEOUS | Status: DC
  Administered 2023-04-17 – 2023-04-18 (×2): 1 via NASAL
  Filled 2023-04-17: qty 22

## 2023-04-17 MED ORDER — METHOCARBAMOL 500 MG PO TABS
500.0000 mg | ORAL_TABLET | Freq: Four times a day (QID) | ORAL | Status: DC | PRN
  Administered 2023-04-17 – 2023-04-18 (×4): 500 mg via ORAL
  Filled 2023-04-17 (×3): qty 1

## 2023-04-17 MED ORDER — RIVAROXABAN 10 MG PO TABS
10.0000 mg | ORAL_TABLET | Freq: Every day | ORAL | Status: DC
  Administered 2023-04-18: 10 mg via ORAL
  Filled 2023-04-17: qty 1

## 2023-04-17 MED ORDER — DOCUSATE SODIUM 100 MG PO CAPS
100.0000 mg | ORAL_CAPSULE | Freq: Two times a day (BID) | ORAL | Status: DC
  Administered 2023-04-17 – 2023-04-18 (×2): 100 mg via ORAL
  Filled 2023-04-17 (×2): qty 1

## 2023-04-17 MED ORDER — PHENYLEPHRINE HCL-NACL 20-0.9 MG/250ML-% IV SOLN
INTRAVENOUS | Status: DC | PRN
  Administered 2023-04-17: 15 ug/min via INTRAVENOUS

## 2023-04-17 MED ORDER — PROPOFOL 10 MG/ML IV BOLUS
INTRAVENOUS | Status: AC
  Filled 2023-04-17: qty 20

## 2023-04-17 MED ORDER — METHOCARBAMOL 1000 MG/10ML IJ SOLN: 500.0000 mg | Freq: Four times a day (QID) | INTRAMUSCULAR | Status: DC | PRN

## 2023-04-17 MED ORDER — TRANEXAMIC ACID-NACL 1000-0.7 MG/100ML-% IV SOLN
1000.0000 mg | INTRAVENOUS | Status: AC
  Administered 2023-04-17: 1000 mg via INTRAVENOUS
  Filled 2023-04-17: qty 100

## 2023-04-17 MED ORDER — TRAMADOL HCL 50 MG PO TABS
50.0000 mg | ORAL_TABLET | Freq: Four times a day (QID) | ORAL | Status: DC | PRN
  Administered 2023-04-17 – 2023-04-18 (×3): 100 mg via ORAL
  Filled 2023-04-17 (×3): qty 2

## 2023-04-17 MED ORDER — SODIUM CHLORIDE 0.9 % IV SOLN
INTRAVENOUS | Status: DC | PRN
  Administered 2023-04-17: 80 mL

## 2023-04-17 MED ORDER — OXYCODONE HCL 5 MG PO TABS: 5.0000 mg | ORAL_TABLET | ORAL | Status: DC | PRN

## 2023-04-17 MED ORDER — PROPOFOL 10 MG/ML IV BOLUS
INTRAVENOUS | Status: DC | PRN
  Administered 2023-04-17: 20 mg via INTRAVENOUS

## 2023-04-17 MED ORDER — ORAL CARE MOUTH RINSE
15.0000 mL | OROMUCOSAL | Status: DC | PRN
Start: 2023-04-17 — End: 2023-04-18

## 2023-04-17 MED ORDER — ACETAMINOPHEN 10 MG/ML IV SOLN
1000.0000 mg | Freq: Four times a day (QID) | INTRAVENOUS | Status: DC
  Administered 2023-04-17: 1000 mg via INTRAVENOUS
  Filled 2023-04-17: qty 100

## 2023-04-17 MED ORDER — BUPIVACAINE LIPOSOME 1.3 % IJ SUSP: 20.0000 mL | Freq: Once | INTRAMUSCULAR | Status: AC

## 2023-04-17 MED ORDER — MORPHINE SULFATE (PF) 2 MG/ML IV SOLN: 1.0000 mg | INTRAVENOUS | Status: DC | PRN

## 2023-04-17 MED ORDER — OXYCODONE HCL 5 MG PO TABS
10.0000 mg | ORAL_TABLET | ORAL | Status: DC | PRN
  Administered 2023-04-17 – 2023-04-18 (×5): 15 mg via ORAL
  Filled 2023-04-17 (×5): qty 3

## 2023-04-17 SURGICAL SUPPLY — 46 items
ATTUNE MED DOME PAT 38 KNEE (Knees) IMPLANT
ATTUNE PS FEM LT SZ 7 CEM KNEE (Femur) IMPLANT
ATTUNE PSRP INSR SZ7 12 KNEE (Insert) IMPLANT
BAG COUNTER SPONGE SURGICOUNT (BAG) IMPLANT
BAG ZIPLOCK 12X15 (MISCELLANEOUS) ×1 IMPLANT
BASE TIBIAL ROT PLAT SZ 8 KNEE (Knees) IMPLANT
BLADE SAG 18X100X1.27 (BLADE) ×1 IMPLANT
BLADE SAW SGTL 11.0X1.19X90.0M (BLADE) ×1 IMPLANT
BNDG ELASTIC 6INX 5YD STR LF (GAUZE/BANDAGES/DRESSINGS) ×1 IMPLANT
BOWL SMART MIX CTS (DISPOSABLE) ×1 IMPLANT
CATH FOLEY 2WAY SLVR 5CC 14FR (CATHETERS) IMPLANT
CEMENT HV SMART SET (Cement) ×2 IMPLANT
COVER SURGICAL LIGHT HANDLE (MISCELLANEOUS) ×1 IMPLANT
CUFF TRNQT CYL 34X4.125X (TOURNIQUET CUFF) ×1 IMPLANT
DERMABOND ADVANCED .7 DNX12 (GAUZE/BANDAGES/DRESSINGS) ×1 IMPLANT
DRAPE U-SHAPE 47X51 STRL (DRAPES) ×1 IMPLANT
DRSG AQUACEL AG ADV 3.5X10 (GAUZE/BANDAGES/DRESSINGS) ×1 IMPLANT
DURAPREP 26ML APPLICATOR (WOUND CARE) ×1 IMPLANT
ELECT REM PT RETURN 15FT ADLT (MISCELLANEOUS) ×1 IMPLANT
GLOVE BIO SURGEON STRL SZ 6.5 (GLOVE) IMPLANT
GLOVE BIO SURGEON STRL SZ7 (GLOVE) IMPLANT
GLOVE BIO SURGEON STRL SZ8 (GLOVE) ×1 IMPLANT
GLOVE BIOGEL PI IND STRL 7.0 (GLOVE) IMPLANT
GLOVE BIOGEL PI IND STRL 8 (GLOVE) ×1 IMPLANT
GOWN STRL REUS W/ TWL LRG LVL3 (GOWN DISPOSABLE) ×1 IMPLANT
HOLDER FOLEY CATH W/STRAP (MISCELLANEOUS) IMPLANT
IMMOBILIZER KNEE 20 (SOFTGOODS) ×1 IMPLANT
IMMOBILIZER KNEE 20 THIGH 36 (SOFTGOODS) ×1 IMPLANT
KIT TURNOVER KIT A (KITS) IMPLANT
MANIFOLD NEPTUNE II (INSTRUMENTS) ×1 IMPLANT
NS IRRIG 1000ML POUR BTL (IV SOLUTION) ×1 IMPLANT
PACK TOTAL KNEE CUSTOM (KITS) ×1 IMPLANT
PADDING CAST COTTON 6X4 STRL (CAST SUPPLIES) ×2 IMPLANT
PIN STEINMAN FIXATION KNEE (PIN) IMPLANT
PROTECTOR NERVE ULNAR (MISCELLANEOUS) ×1 IMPLANT
SET HNDPC FAN SPRY TIP SCT (DISPOSABLE) ×1 IMPLANT
SUT MNCRL AB 4-0 PS2 18 (SUTURE) ×1 IMPLANT
SUT STRATAFIX 0 PDS 27 VIOLET (SUTURE) ×1 IMPLANT
SUT VIC AB 2-0 CT1 TAPERPNT 27 (SUTURE) ×3 IMPLANT
SUTURE STRATFX 0 PDS 27 VIOLET (SUTURE) ×1 IMPLANT
TIBIAL BASE ROT PLAT SZ 8 KNEE (Knees) ×1 IMPLANT
TOWEL GREEN STERILE FF (TOWEL DISPOSABLE) ×1 IMPLANT
TRAY FOLEY MTR SLVR 16FR STAT (SET/KITS/TRAYS/PACK) IMPLANT
TUBE SUCTION HIGH CAP CLEAR NV (SUCTIONS) ×1 IMPLANT
WATER STERILE IRR 1000ML POUR (IV SOLUTION) ×2 IMPLANT
WRAP KNEE MAXI GEL POST OP (GAUZE/BANDAGES/DRESSINGS) ×1 IMPLANT

## 2023-04-17 NOTE — Interval H&P Note (Signed)
 History and Physical Interval Note:  04/17/2023 6:35 AM  [name]  has presented today for surgery, with the diagnosis of left knee osteoarthritis.  The various methods of treatment have been discussed with the patient and family. After consideration of risks, benefits and other options for treatment, the patient has consented to  Procedure(s): ARTHROPLASTY, KNEE, TOTAL (Left) as a surgical intervention.  The patient's history has been reviewed, patient examined, no change in status, stable for surgery.  I have reviewed the patient's chart and labs.  Questions were answered to the patient's satisfaction.     Homero Fellers Kentrell Hallahan

## 2023-04-17 NOTE — Anesthesia Postprocedure Evaluation (Signed)
 Anesthesia Post Note  Patient: Jonathan Jones  Procedure(s) Performed: ARTHROPLASTY, KNEE, TOTAL (Left: Knee)     Patient location during evaluation: PACU Anesthesia Type: MAC and Spinal Level of consciousness: awake Pain management: pain level controlled Vital Signs Assessment: post-procedure vital signs reviewed and stable Respiratory status: spontaneous breathing, nonlabored ventilation and respiratory function stable Cardiovascular status: stable and blood pressure returned to baseline Postop Assessment: no apparent nausea or vomiting, no headache, no backache and spinal receding Anesthetic complications: no   No notable events documented.  Last Vitals:  Vitals:   04/17/23 0945 04/17/23 1024  BP: (!) 163/82 (!) 167/87  Pulse: (!) 58 (!) 59  Resp: 11 16  Temp: (!) 36.4 C 36.7 C  SpO2: 100% 100%    Last Pain:  Vitals:   04/17/23 1050  TempSrc:   PainSc: 3                  Linton Rump

## 2023-04-17 NOTE — Progress Notes (Signed)
 Orthopedic Tech Progress Note Patient Details:  Jonathan Jones 04-11-1960 [id number]  CPM Left Knee CPM Left Knee: On Left Knee Flexion (Degrees): 40 Left Knee Extension (Degrees): 10  Post Interventions Patient Tolerated: Well  Darleen Crocker 04/17/2023, 9:14 AM

## 2023-04-17 NOTE — Transfer of Care (Signed)
 Immediate Anesthesia Transfer of Care Note  Patient: [name]  Procedure(s) Performed: ARTHROPLASTY, KNEE, TOTAL (Left: Knee)  Patient Location: PACU  Anesthesia Type:MAC and Spinal  Level of Consciousness: drowsy and patient cooperative  Airway & Oxygen Therapy: Patient Spontanous Breathing and Patient connected to face mask oxygen  Post-op Assessment: Report given to RN and Post -op Vital signs reviewed and stable  Post vital signs: Reviewed and stable  Last Vitals:  Vitals Value Taken Time  BP 95/60 04/17/23 0843  Temp    Pulse 60 04/17/23 0844  Resp 12 04/17/23 0844  SpO2 99 % 04/17/23 0844  Vitals shown include unfiled device data.  Last Pain:  Vitals:   04/17/23 0554  TempSrc:   PainSc: 0-No pain         Complications: No notable events documented.

## 2023-04-17 NOTE — Anesthesia Procedure Notes (Signed)
 Spinal  Patient location during procedure: OR Start time: 04/17/2023 7:12 AM End time: 04/17/2023 7:15 AM Reason for block: surgical anesthesia Staffing Performed: anesthesiologist  Anesthesiologist: Linton Rump, MD Performed by: Linton Rump, MD Authorized by: Linton Rump, MD   Preanesthetic Checklist Completed: patient identified, IV checked, site marked, risks and benefits discussed, surgical consent, monitors and equipment checked, pre-op evaluation and timeout performed Spinal Block Patient position: sitting Prep: DuraPrep Patient monitoring: blood pressure and continuous pulse ox Approach: midline Location: L3-4 Injection technique: single-shot Needle Needle type: Pencan  Needle gauge: 24 G Needle length: 9 cm Additional Notes Risks and benefits of neuraxial anesthesia including, but not limited to, infection, bleeding, local anesthetic toxicity, headache, hypotension, back pain, block failure, etc. were discussed with the patient. The patient expressed understanding and consented to the procedure. I confirmed that the patient has no bleeding disorders and is not taking blood thinners. I confirmed the patient's last platelet count with the nurse. Monitors were applied. A time-out was performed immediately prior to the procedure. Sterile technique was used throughout the whole procedure.   1 attempt(s)

## 2023-04-17 NOTE — OR Nursing (Signed)
 Unable to advance 55fr foley catheter into urinary meatus, catheterized with 4fr. Dr Lequita Halt notified

## 2023-04-17 NOTE — Anesthesia Procedure Notes (Signed)
 Anesthesia Regional Block: Adductor canal block   Pre-Anesthetic Checklist: , timeout performed,  Correct Patient, Correct Site, Correct Laterality,  Correct Procedure, Correct Position, site marked,  Risks and benefits discussed,  Surgical consent,  Pre-op evaluation,  At surgeon's request and post-op pain management  Laterality: Left  Prep: chloraprep       Needles:  Injection technique: Single-shot  Needle Type: Echogenic Stimulator Needle     Needle Length: 9cm  Needle Gauge: 21     Additional Needles:   Procedures:,,,, ultrasound used (permanent image in chart),,    Narrative:  Start time: 04/17/2023 6:56 AM End time: 04/17/2023 6:58 AM Injection made incrementally with aspirations every 5 mL.  Performed by: Personally  Anesthesiologist: Linton Rump, MD  Additional Notes: Discussed risks and benefits of nerve block including, but not limited to, prolonged and/or permanent nerve injury involving sensory and/or motor function. Monitors were applied and a time-out was performed. The nerve and associated structures were visualized under ultrasound guidance. After negative aspiration, local anesthetic was slowly injected around the nerve. There was no evidence of high pressure during the procedure. There were no paresthesias. VSS remained stable and the patient tolerated the procedure well.

## 2023-04-17 NOTE — Op Note (Signed)
 OPERATIVE REPORT-TOTAL KNEE ARTHROPLASTY   Pre-operative diagnosis- Osteoarthritis  Left knee(s)  Post-operative diagnosis- Osteoarthritis Left knee(s)  Procedure-  Left  Total Knee Arthroplasty  Surgeon- Gus Rankin. Django Nguyen, MD  Assistant- Weston Brass, PA-C   Anesthesia-   Adductor canal block and spinal  EBL-50 mL   Drains None  Tourniquet time-  Total Tourniquet Time Documented: Thigh (Left) - 34 minutes Total: Thigh (Left) - 34 minutes     Complications- None  Condition-PACU - hemodynamically stable.   Brief Clinical Note   Jonathan Jones is a 63 y.o. year old male with end stage OA of his left knee with progressively worsening pain and dysfunction. He has constant pain, with activity and at rest and significant functional deficits with difficulties even with ADLs. He has had extensive non-op management including analgesics, injections of cortisone and viscosupplements, and home exercise program, but remains in significant pain with significant dysfunction. Radiographs show bone on bone arthritis medial and patellofemoral. He presents now for left Total Knee Arthroplasty.     Procedure in detail---   The patient is brought into the operating room and positioned supine on the operating table. After successful administration of  Adductor canal block and spinal,   a tourniquet is placed high on the  Left thigh(s) and the lower extremity is prepped and draped in the usual sterile fashion. Time out is performed by the operating team and then the  Left lower extremity is wrapped in Esmarch, knee flexed and the tourniquet inflated to 300 mmHg.       A midline incision is made with a ten blade through the subcutaneous tissue to the level of the extensor mechanism. A fresh blade is used to make a medial parapatellar arthrotomy. Soft tissue over the proximal medial tibia is subperiosteally elevated to the joint line with a knife and into the semimembranosus bursa with a Cobb  elevator. Soft tissue over the proximal lateral tibia is elevated with attention being paid to avoiding the patellar tendon on the tibial tubercle. The patella is everted, knee flexed 90 degrees and the ACL and PCL are removed. Findings are bone on bone medial and patellofemoral with large global soteophytes        The drill is used to create a starting hole in the distal femur and the canal is thoroughly irrigated with sterile saline to remove the fatty contents. The 5 degree Left  valgus alignment guide is placed into the femoral canal and the distal femoral cutting block is pinned to remove 10 mm off the distal femur. Resection is made with an oscillating saw.      The tibia is subluxed forward and the menisci are removed. The extramedullary alignment guide is placed referencing proximally at the medial aspect of the tibial tubercle and distally along the second metatarsal axis and tibial crest. The block is pinned to remove 2mm off the more deficient medial  side. Resection is made with an oscillating saw. Size 8is the most appropriate size for the tibia and the proximal tibia is prepared with the modular drill and keel punch for that size.      The femoral sizing guide is placed and size 7 is most appropriate. Rotation is marked off the epicondylar axis and confirmed by creating a rectangular flexion gap at 90 degrees. The size 7 cutting block is pinned in this rotation and the anterior, posterior and chamfer cuts are made with the oscillating saw. The intercondylar block is then placed and that cut  is made.      Trial size 8 tibial component, trial size 7 posterior stabilized femur and a 12  mm posterior stabilized rotating platform insert trial is placed. Full extension is achieved with excellent varus/valgus and anterior/posterior balance throughout full range of motion. The patella is everted and thickness measured to be 24  mm. Free hand resection is taken to 14 mm, a 38 template is placed, lug holes  are drilled, trial patella is placed, and it tracks normally. Osteophytes are removed off the posterior femur with the trial in place. All trials are removed and the cut bone surfaces prepared with pulsatile lavage. Cement is mixed and once ready for implantation, the size 8 tibial implant, size  7 posterior stabilized femoral component, and the size 38 patella are cemented in place and the patella is held with the clamp. The trial insert is placed and the knee held in full extension. The Exparel (20 ml mixed with 60 ml saline) is injected into the extensor mechanism, posterior capsule, medial and lateral gutters and subcutaneous tissues.  All extruded cement is removed and once the cement is hard the permanent 12 mm posterior stabilized rotating platform insert is placed into the tibial tray.      The wound is copiously irrigated with saline solution and the extensor mechanism closed with # 0 Stratofix suture. The tourniquet is released for a total tourniquet time of 34  minutes. Flexion against gravity is 140 degrees and the patella tracks normally. Subcutaneous tissue is closed with 2.0 vicryl and subcuticular with running 4.0 Monocryl. The incision is cleaned and dried and steri-strips and a bulky sterile dressing are applied. The limb is placed into a knee immobilizer and the patient is awakened and transported to recovery in stable condition.      Please note that a surgical assistant was a medical necessity for this procedure in order to perform it in a safe and expeditious manner. Surgical assistant was necessary to retract the ligaments and vital neurovascular structures to prevent injury to them and also necessary for proper positioning of the limb to allow for anatomic placement of the prosthesis.   Gus Rankin Fedra Lanter, MD    04/17/2023, 8:20 AM

## 2023-04-17 NOTE — Discharge Instructions (Addendum)
 Frank Aluisio, MD Total Joint Specialist [facility] [address] [place], [place] [address] [phone]  TOTAL KNEE REPLACEMENT POSTOPERATIVE DIRECTIONS    Knee Rehabilitation, Guidelines Following Surgery  Results after knee surgery are often greatly improved when you follow the exercise, range of motion and muscle strengthening exercises prescribed by your doctor. Safety measures are also important to protect the knee from further injury. If any of these exercises cause you to have increased pain or swelling in your knee joint, decrease the amount until you are comfortable again and slowly increase them. If you have problems or questions, call your caregiver or physical therapist for advice.   BLOOD CLOT PREVENTION Take a 10 mg Xarelto once a day for three weeks following surgery. Then take an 81 mg Aspirin once a day for three weeks. Then discontinue Aspirin. You may resume your vitamins/supplements once you have discontinued the Xarelto. Do not take any NSAIDs (Advil, Aleve, Ibuprofen, Meloxicam, etc.) until you have discontinued the Xarelto.   HOME CARE INSTRUCTIONS  Remove items at home which could result in a fall. This includes throw rugs or furniture in walking pathways.  ICE to the affected knee as much as tolerated. Icing helps control swelling. If the swelling is well controlled you will be more comfortable and rehab easier. Continue to use ice on the knee for pain and swelling from surgery. You may notice swelling that will progress down to the foot and ankle. This is normal after surgery. Elevate the leg when you are not up walking on it.    Continue to use the breathing machine which will help keep your temperature down. It is common for your temperature to cycle up and down following surgery, especially at night when you are not up moving around and exerting yourself. The breathing machine keeps your lungs expanded and your temperature  down. Do not place pillow under the operative knee, focus on keeping the knee straight while resting  DIET You may resume your previous home diet once you are discharged from the hospital.  DRESSING / WOUND CARE / SHOWERING Keep your bulky bandage on for 2 days. On the third post-operative day you may remove the Ace bandage and gauze. There is a waterproof adhesive bandage on your skin which will stay in place until your first follow-up appointment. Once you remove this you will not need to place another bandage You may begin showering 3 days following surgery, but do not submerge the incision under water.  ACTIVITY For the first 5 days, the key is rest and control of pain and swelling Do your home exercises twice a day starting on post-operative day 3. On the days you go to physical therapy, just do the home exercises once that day. You should rest, ice and elevate the leg for 50 minutes out of every hour. Get up and walk/stretch for 10 minutes per hour. After 5 days you can increase your activity slowly as tolerated. Walk with your walker as instructed. Use the walker until you are comfortable transitioning to a cane. Walk with the cane in the opposite hand of the operative leg. You may discontinue the cane once you are comfortable and walking steadily. Avoid periods of inactivity such as sitting longer than an hour when not asleep. This helps prevent blood clots.  You may discontinue the knee immobilizer once you are able to perform a straight leg raise while lying down. You may resume a sexual relationship in one month or   when given the OK by your doctor.  You may return to work once you are cleared by your doctor.  Do not drive a car for 6 weeks or until released by your surgeon.  Do not drive while taking narcotics.  TED HOSE STOCKINGS Wear the elastic stockings on both legs for three weeks following surgery during the day. You may remove them at night for sleeping.  WEIGHT  BEARING Weight bearing as tolerated with assist device (walker, cane, etc) as directed, use it as long as suggested by your surgeon or therapist, typically at least 4-6 weeks.  POSTOPERATIVE CONSTIPATION PROTOCOL Constipation - defined medically as fewer than three stools per week and severe constipation as less than one stool per week.  One of the most common issues patients have following surgery is constipation.  Even if you have a regular bowel pattern at home, your normal regimen is likely to be disrupted due to multiple reasons following surgery.  Combination of anesthesia, postoperative narcotics, change in appetite and fluid intake all can affect your bowels.  In order to avoid complications following surgery, here are some recommendations in order to help you during your recovery period.  Colace (docusate) - Pick up an over-the-counter form of Colace or another stool softener and take twice a day as long as you are requiring postoperative pain medications.  Take with a full glass of water daily.  If you experience loose stools or diarrhea, hold the colace until you stool forms back up. If your symptoms do not get better within 1 week or if they get worse, check with your doctor. Dulcolax (bisacodyl) - Pick up over-the-counter and take as directed by the product packaging as needed to assist with the movement of your bowels.  Take with a full glass of water.  Use this product as needed if not relieved by Colace only.  MiraLax (polyethylene glycol) - Pick up over-the-counter to have on hand. MiraLax is a solution that will increase the amount of water in your bowels to assist with bowel movements.  Take as directed and can mix with a glass of water, juice, soda, coffee, or tea. Take if you go more than two days without a movement. Do not use MiraLax more than once per day. Call your doctor if you are still constipated or irregular after using this medication for 7 days in a row.  If you continue  to have problems with postoperative constipation, please contact the office for further assistance and recommendations.  If you experience "the worst abdominal pain ever" or develop nausea or vomiting, please contact the office immediatly for further recommendations for treatment.  ITCHING If you experience itching with your medications, try taking only a single pain pill, or even half a pain pill at a time.  You can also use Benadryl over the counter for itching or also to help with sleep.   MEDICATIONS See your medication summary on the "After Visit Summary" that the nursing staff will review with you prior to discharge.  You may have some home medications which will be placed on hold until you complete the course of blood thinner medication.  It is important for you to complete the blood thinner medication as prescribed by your surgeon.  Continue your approved medications as instructed at time of discharge.  PRECAUTIONS If you experience chest pain or shortness of breath - call 911 immediately for transfer to the hospital emergency department.  If you develop a fever greater that 101 F, purulent   drainage from wound, increased redness or drainage from wound, foul odor from the wound/dressing, or calf pain - CONTACT YOUR SURGEON.                                                   FOLLOW-UP APPOINTMENTS Make sure you keep all of your appointments after your operation with your surgeon and caregivers. You should call the office at the above phone number and make an appointment for approximately two weeks after the date of your surgery or on the date instructed by your surgeon outlined in the "After Visit Summary".  RANGE OF MOTION AND STRENGTHENING EXERCISES  Rehabilitation of the knee is important following a knee injury or an operation. After just a few days of immobilization, the muscles of the thigh which control the knee become weakened and shrink (atrophy). Knee exercises are designed to build up  the tone and strength of the thigh muscles and to improve knee motion. Often times heat used for twenty to thirty minutes before working out will loosen up your tissues and help with improving the range of motion but do not use heat for the first two weeks following surgery. These exercises can be done on a training (exercise) mat, on the floor, on a table or on a bed. Use what ever works the best and is most comfortable for you Knee exercises include:  Leg Lifts - While your knee is still immobilized in a splint or cast, you can do straight leg raises. Lift the leg to 60 degrees, hold for 3 sec, and slowly lower the leg. Repeat 10-20 times 2-3 times daily. Perform this exercise against resistance later as your knee gets better.  Quad and Hamstring Sets - Tighten up the muscle on the front of the thigh (Quad) and hold for 5-10 sec. Repeat this 10-20 times hourly. Hamstring sets are done by pushing the foot backward against an object and holding for 5-10 sec. Repeat as with quad sets.  Leg Slides: Lying on your back, slowly slide your foot toward your buttocks, bending your knee up off the floor (only go as far as is comfortable). Then slowly slide your foot back down until your leg is flat on the floor again. Angel Wings: Lying on your back spread your legs to the side as far apart as you can without causing discomfort.  A rehabilitation program following serious knee injuries can speed recovery and prevent re-injury in the future due to weakened muscles. Contact your doctor or a physical therapist for more information on knee rehabilitation.   POST-OPERATIVE OPIOID TAPER INSTRUCTIONS: It is important to wean off of your opioid medication as soon as possible. If you do not need pain medication after your surgery it is ok to stop day one. Opioids include: Codeine, Hydrocodone(Norco, Vicodin), Oxycodone(Percocet, oxycontin) and hydromorphone amongst others.  Long term and even short term use of opiods can  cause: Increased pain response Dependence Constipation Depression Respiratory depression And more.  Withdrawal symptoms can include Flu like symptoms Nausea, vomiting And more Techniques to manage these symptoms Hydrate well Eat regular healthy meals Stay active Use relaxation techniques(deep breathing, meditating, yoga) Do Not substitute Alcohol to help with tapering If you have been on opioids for less than two weeks and do not have pain than it is ok to stop all together.  Plan to   wean off of opioids This plan should start within one week post op of your joint replacement. Maintain the same interval or time between taking each dose and first decrease the dose.  Cut the total daily intake of opioids by one tablet each day Next start to increase the time between doses. The last dose that should be eliminated is the evening dose.   IF YOU ARE TRANSFERRED TO A SKILLED REHAB FACILITY If the patient is transferred to a skilled rehab facility following release from the hospital, a list of the current medications will be sent to the facility for the patient to continue.  When discharged from the skilled rehab facility, please have the facility set up the patient's Home Health Physical Therapy prior to being released. Also, the skilled facility will be responsible for providing the patient with their medications at time of release from the facility to include their pain medication, the muscle relaxants, and their blood thinner medication. If the patient is still at the rehab facility at time of the two week follow up appointment, the skilled rehab facility will also need to assist the patient in arranging follow up appointment in our office and any transportation needs.  MAKE SURE YOU:  Understand these instructions.  Get help right away if you are not doing well or get worse.   DENTAL ANTIBIOTICS:  In most cases prophylactic antibiotics for Dental procdeures after total joint surgery are  not necessary.  Exceptions are as follows:  1. History of prior total joint infection  2. Severely immunocompromised (Organ Transplant, cancer chemotherapy, Rheumatoid biologic medications such as Humera)  3. Poorly controlled diabetes (A1C &gt; 8.0, blood glucose over 200)  If you have one of these conditions, contact your surgeon for an antibiotic prescription, prior to your dental procedure.    Pick up stool softner and laxative for home use following surgery while on pain medications. Do not submerge incision under water. Please use good hand washing techniques while changing dressing each day. May shower starting three days after surgery. Please use a clean towel to pat the incision dry following showers. Continue to use ice for pain and swelling after surgery. Do not use any lotions or creams on the incision until instructed by your surgeon.    Information on my medicine - XARELTO (Rivaroxaban)     Why was Xarelto prescribed for you? Xarelto was prescribed for you to reduce the risk of blood clots forming after orthopedic surgery. The medical term for these abnormal blood clots is venous thromboembolism (VTE).  What do you need to know about xarelto ? Take your Xarelto ONCE DAILY at the same time every day. You may take it either with or without food.  If you have difficulty swallowing the tablet whole, you may crush it and mix in applesauce just prior to taking your dose.  Take Xarelto exactly as prescribed by your doctor and DO NOT stop taking Xarelto without talking to the doctor who prescribed the medication.  Stopping without other VTE prevention medication to take the place of Xarelto may increase your risk of developing a clot.  After discharge, you should have regular check-up appointments with your healthcare provider that is prescribing your Xarelto.    What do you do if you miss a dose? If you miss a dose, take it as soon as you remember on the same  day then continue your regularly scheduled once daily regimen the next day. Do not take two doses of Xarelto on the   same day.   Important Safety Information A possible side effect of Xarelto is bleeding. You should call your healthcare provider right away if you experience any of the following: Bleeding from an injury or your nose that does not stop. Unusual colored urine (red or dark brown) or unusual colored stools (red or black). Unusual bruising for unknown reasons. A serious fall or if you hit your head (even if there is no bleeding).  Some medicines may interact with Xarelto and might increase your risk of bleeding while on Xarelto. To help avoid this, consult your healthcare provider or pharmacist prior to using any new prescription or non-prescription medications, including herbals, vitamins, non-steroidal anti-inflammatory drugs (NSAIDs) and supplements.  This website has more information on Xarelto: www.xarelto.com.   

## 2023-04-17 NOTE — Evaluation (Signed)
 Physical Therapy Evaluation Patient Details Name: Jonathan Jones MRN: [id number] DOB: [birth year] Today's Date: 04/17/2023  History of Present Illness  Pt is 63 yo male admitted on 04/17/23 for L TKA.  Pt with hx including but not limited to pacemaker, CAD, HLD, migraine, HTN, aortic stenosis, aortic valve replacement, arthritis,  Clinical Impression  Pt is s/p TKA resulting in the deficits listed below (see PT Problem List). At baseline, pt is active, working, and independent.  He has support at d/c and can stay on first floor of home.  Does need a RW.  Today, pt motivated to get OOB and pain decreased OOB; however, pt lightheaded and with orthostatic hypotension.  He was positioned in reclined with VSS and pain improved from beginning of session.  Educated on orthostatic hypotension, reclining if needed, and calling nurse if lightheaded.  Pt will benefit from acute skilled PT to increase their independence and safety with mobility to allow discharge.      BP as follows: 91/62 sitting EOB (pt reports some symptoms and that this is low BP for him, but pain improving and wants to get OOB) BP dropped to 79/55 in standing and symptoms worsening so only transferred to recliner and reclined.  BP 103/68 initially in recliner and up to 131/75 after 5 mins.  Symptoms improve when reclined.       If plan is discharge home, recommend the following: A little help with walking and/or transfers;A little help with bathing/dressing/bathroom;Assistance with cooking/housework;Help with stairs or ramp for entrance   Can travel by private vehicle        Equipment Recommendations Rolling walker (2 wheels)  Recommendations for Other Services       Functional Status Assessment Patient has had a recent decline in their functional status and demonstrates the ability to make significant improvements in function in a reasonable and predictable amount of time.     Precautions / Restrictions  Precautions Precautions: Knee;Fall Restrictions Weight Bearing Restrictions Per Provider Order: Yes LLE Weight Bearing Per Provider Order: Weight bearing as tolerated      Mobility  Bed Mobility Overal bed mobility: Needs Assistance Bed Mobility: Supine to Sit     Supine to sit: Supervision          Transfers Overall transfer level: Needs assistance Equipment used: Rolling walker (2 wheels) Transfers: Sit to/from Stand, Bed to chair/wheelchair/BSC Sit to Stand: Contact guard assist   Step pivot transfers: Contact guard assist       General transfer comment: stood without difficulty; cues for hand placement;  Step pivot to chair only limited by hypotension    Ambulation/Gait               General Gait Details: unable due to orthostatic hypotension  Stairs            Wheelchair Mobility     Tilt Bed    Modified Rankin (Stroke Patients Only)       Balance Overall balance assessment: Needs assistance Sitting-balance support: No upper extremity supported Sitting balance-Leahy Scale: Good     Standing balance support: Bilateral upper extremity supported, Reliant on assistive device for balance, No upper extremity supported Standing balance-Leahy Scale: Fair Standing balance comment: Coud static stand wtihout AD but using RW for transfer                             Pertinent Vitals/Pain Pain Assessment Pain Assessment: 0-10 Pain Score:  (8/10  bed, 6/10 OOB) Pain Location: L knee Pain Descriptors / Indicators: Discomfort Pain Intervention(s): Limited activity within patient's tolerance, Monitored during session, Premedicated before session, Repositioned, Ice applied    Home Living Family/patient expects to be discharged to:: Private residence Living Arrangements: Spouse/significant other Available Help at Discharge: Family;Available 24 hours/day Type of Home: House Home Access: Stairs to enter Entrance Stairs-Rails:  None Entrance Stairs-Number of Steps: 1 small step onto porch   Home Layout: Two level;Able to live on main level with bedroom/bathroom Home Equipment: None      Prior Function Prior Level of Function : Independent/Modified Independent;Working/employed;Driving                     Extremity/Trunk Assessment   Upper Extremity Assessment Upper Extremity Assessment: Overall WFL for tasks assessed    Lower Extremity Assessment Lower Extremity Assessment: LLE deficits/detail;RLE deficits/detail RLE Deficits / Details: ROM WFL; MMT 5/5 RLE Sensation: WNL LLE Deficits / Details: Expected post op changes; ROM knee 5 to 70 degrees; MMT: ankle 5/5, knee and hip 3/5 not further tested LLE Sensation: WNL    Cervical / Trunk Assessment Cervical / Trunk Assessment: Normal  Communication        Cognition Arousal: Alert Behavior During Therapy: WFL for tasks assessed/performed   PT - Cognitive impairments: No apparent impairments                       PT - Cognition Comments: motivated; wife reports "he is very positive and will push it."         Cueing       General Comments      Exercises Other Exercises Other Exercises: Encouraged to perform ankle pumps and quad sets tonight.  Also, discussed could do heel slides if feeling stiff.  Pt demonstrated   Assessment/Plan    PT Assessment Patient needs continued PT services  PT Problem List Decreased strength;Decreased range of motion;Decreased activity tolerance;Decreased balance;Decreased mobility;Decreased cognition;Decreased knowledge of use of DME;Cardiopulmonary status limiting activity       PT Treatment Interventions DME instruction;Therapeutic exercise;Gait training;Balance training;Stair training;Therapeutic activities;Functional mobility training;Patient/family education;Modalities    PT Goals (Current goals can be found in the Care Plan section)  Acute Rehab PT Goals Patient Stated Goal: return  home PT Goal Formulation: With patient/family Time For Goal Achievement: 04/25/23 Potential to Achieve Goals: Good    Frequency 7X/week     Co-evaluation               AM-PAC PT "6 Clicks" Mobility  Outcome Measure Help needed turning from your back to your side while in a flat bed without using bedrails?: A Little Help needed moving from lying on your back to sitting on the side of a flat bed without using bedrails?: A Little Help needed moving to and from a bed to a chair (including a wheelchair)?: A Little Help needed standing up from a chair using your arms (e.g., wheelchair or bedside chair)?: A Little Help needed to walk in hospital room?: A Little Help needed climbing 3-5 steps with a railing? : A Little 6 Click Score: 18    End of Session Equipment Utilized During Treatment: Gait belt Activity Tolerance: Treatment limited secondary to medical complications (Comment) Patient left: in chair;with chair alarm set;with call bell/phone within reach;with SCD's reapplied Nurse Communication: Mobility status;Other (comment) (orthostatic) PT Visit Diagnosis: Other abnormalities of gait and mobility (R26.89);Muscle weakness (generalized) (M62.81)    Time: 4098-1191 PT Time  Calculation (min) (ACUTE ONLY): 29 min   Charges:   PT Evaluation $PT Eval Low Complexity: 1 Low PT Treatments $Therapeutic Activity: 8-22 mins PT General Charges $$ ACUTE PT VISIT: 1 Visit         Anise Salvo, PT Acute Rehab [facility] [phone]   Rayetta Humphrey 04/17/2023, 2:19 PM

## 2023-04-17 NOTE — Progress Notes (Signed)
 Orthopedic Tech Progress Note Patient Details:  Jonathan Jones 20-Oct-1960 [id number]  CPM Left Knee CPM Left Knee: Off Left Knee Flexion (Degrees): 40 Left Knee Extension (Degrees): 10  Post Interventions Patient Tolerated: Well Received call to place patient back into CPM because it was removed at 12:30. Applied machine and then removed it due to patients pain. CPM is now off and will remain off for the day. Darleen Crocker 04/17/2023, 1:13 PM

## 2023-04-18 ENCOUNTER — Encounter (HOSPITAL_COMMUNITY): Payer: Self-pay | Admitting: Orthopedic Surgery

## 2023-04-18 ENCOUNTER — Other Ambulatory Visit (HOSPITAL_COMMUNITY): Payer: Self-pay

## 2023-04-18 DIAGNOSIS — I2510 Atherosclerotic heart disease of native coronary artery without angina pectoris: Secondary | ICD-10-CM | POA: Diagnosis not present

## 2023-04-18 DIAGNOSIS — Z79899 Other long term (current) drug therapy: Secondary | ICD-10-CM | POA: Diagnosis not present

## 2023-04-18 DIAGNOSIS — I10 Essential (primary) hypertension: Secondary | ICD-10-CM | POA: Diagnosis not present

## 2023-04-18 DIAGNOSIS — M1712 Unilateral primary osteoarthritis, left knee: Secondary | ICD-10-CM | POA: Diagnosis not present

## 2023-04-18 DIAGNOSIS — Z950 Presence of cardiac pacemaker: Secondary | ICD-10-CM | POA: Diagnosis not present

## 2023-04-18 DIAGNOSIS — Z7982 Long term (current) use of aspirin: Secondary | ICD-10-CM | POA: Diagnosis not present

## 2023-04-18 DIAGNOSIS — Z951 Presence of aortocoronary bypass graft: Secondary | ICD-10-CM | POA: Diagnosis not present

## 2023-04-18 LAB — BASIC METABOLIC PANEL WITH GFR
Anion gap: 8 (ref 5–15)
BUN: 19 mg/dL (ref 8–23)
CO2: 25 mmol/L (ref 22–32)
Calcium: 8.3 mg/dL — ABNORMAL LOW (ref 8.9–10.3)
Chloride: 100 mmol/L (ref 98–111)
Creatinine, Ser: 0.8 mg/dL (ref 0.61–1.24)
GFR, Estimated: >60 mL/min (ref >60)
Glucose, Bld: 127 mg/dL — ABNORMAL HIGH (ref 70–99)
Potassium: 3.7 mmol/L (ref 3.5–5.1)
Sodium: 133 mmol/L — ABNORMAL LOW (ref 135–145)

## 2023-04-18 LAB — CBC
HCT: 35.9 % — ABNORMAL LOW (ref 39.0–52.0)
Hemoglobin: 11.8 g/dL — ABNORMAL LOW (ref 13.0–17.0)
MCH: 30.3 pg (ref 26.0–34.0)
MCHC: 32.9 g/dL (ref 30.0–36.0)
MCV: 92.1 fL (ref 80.0–100.0)
Platelets: 187 10*3/uL (ref 150–400)
RBC: 3.9 MIL/uL — ABNORMAL LOW (ref 4.22–5.81)
RDW: 12.5 % (ref 11.5–15.5)
WBC: 11.3 10*3/uL — ABNORMAL HIGH (ref 4.0–10.5)
nRBC: 0 % (ref 0.0–0.2)

## 2023-04-18 MED ORDER — METHOCARBAMOL 500 MG PO TABS
500.0000 mg | ORAL_TABLET | Freq: Four times a day (QID) | ORAL | 0 refills | Status: AC | PRN
  Filled 2023-04-18: qty 40, 10d supply, fill #0

## 2023-04-18 MED ORDER — ONDANSETRON HCL 4 MG PO TABS
4.0000 mg | ORAL_TABLET | Freq: Four times a day (QID) | ORAL | 0 refills | Status: AC | PRN
  Filled 2023-04-18: qty 20, 5d supply, fill #0

## 2023-04-18 MED ORDER — TRAMADOL HCL 50 MG PO TABS
50.0000 mg | ORAL_TABLET | Freq: Four times a day (QID) | ORAL | 0 refills | Status: AC | PRN
Start: 2023-04-18 — End: ?
  Filled 2023-04-18: qty 40, 5d supply, fill #0

## 2023-04-18 MED ORDER — OXYCODONE HCL 5 MG PO TABS
5.0000 mg | ORAL_TABLET | Freq: Four times a day (QID) | ORAL | 0 refills | Status: AC | PRN
Start: 2023-04-18 — End: ?
  Filled 2023-04-18: qty 42, 6d supply, fill #0

## 2023-04-18 MED ORDER — RIVAROXABAN 10 MG PO TABS
10.0000 mg | ORAL_TABLET | Freq: Every day | ORAL | 0 refills | Status: AC
  Filled 2023-04-18: qty 20, 20d supply, fill #0

## 2023-04-18 NOTE — Progress Notes (Signed)
 Physical Therapy Treatment Patient Details Name: Jonathan Jones MRN: [id number] DOB: [birth year] Today's Date: 04/18/2023   History of Present Illness Pt is 63 yo male admitted on 04/17/23 for L TKA.  Pt with hx including but not limited to pacemaker, CAD, HLD, migraine, HTN, aortic stenosis, aortic valve replacement, arthritis,    PT Comments  Pt is POD # 1 and is progressing well.  Pt has increased pain but is able to tolerate activity.  He has good quad activation.  Has difficulty with terminal knee ext - encouraged resting with leg straight and quad sets.  Pt able to ambulate 100' and performed stairs similar to home set up.  Has outpt PT scheduled later this week. Pt demonstrates safe gait & transfers in order to return home from PT perspective once discharged by MD.  While in hospital, will continue to benefit from PT for skilled therapy to advance mobility and exercises.       If plan is discharge home, recommend the following: A little help with walking and/or transfers;A little help with bathing/dressing/bathroom;Assistance with cooking/housework;Help with stairs or ramp for entrance   Can travel by private vehicle        Equipment Recommendations  Rolling walker (2 wheels)    Recommendations for Other Services       Precautions / Restrictions Precautions Precautions: Knee;Fall Restrictions LLE Weight Bearing Per Provider Order: Weight bearing as tolerated     Mobility  Bed Mobility Overal bed mobility: Needs Assistance Bed Mobility: Supine to Sit     Supine to sit: Supervision          Transfers Overall transfer level: Needs assistance Equipment used: Rolling walker (2 wheels) Transfers: Sit to/from Stand Sit to Stand: Supervision           General transfer comment: Cues for hand placement    Ambulation/Gait Ambulation/Gait assistance: Contact guard assist, Supervision Gait Distance (Feet): 120 Feet Assistive device: Rolling walker (2 wheels) Gait  Pattern/deviations: Step-through pattern, Decreased stride length, Decreased weight shift to left Gait velocity: decreased but functional     General Gait Details: Initial step to but progressed to step through; steady with RW; difficulty with terminal ext on L   Stairs Stairs: Yes Stairs assistance: Contact guard assist Stair Management: Forwards, With walker, Step to pattern Number of Stairs: 1 General stair comments: 1 platform to simulate step onto deck; tolerated well forward with RW   Wheelchair Mobility     Tilt Bed    Modified Rankin (Stroke Patients Only)       Balance Overall balance assessment: Needs assistance Sitting-balance support: No upper extremity supported Sitting balance-Leahy Scale: Good     Standing balance support: Bilateral upper extremity supported, No upper extremity supported Standing balance-Leahy Scale: Fair Standing balance comment: Coud static stand wtihout AD but using RW for ambulation                            Communication    Cognition Arousal: Alert Behavior During Therapy: WFL for tasks assessed/performed   PT - Cognitive impairments: No apparent impairments                                Cueing    Exercises Total Joint Exercises Ankle Circles/Pumps: AROM, Both, 10 reps, Supine Quad Sets: AROM, Both, 10 reps, Supine, Limitations Quad Sets Limitations: Pt with difficulty wtih terminal knee  ext.  Did stress importance of quad sets, resting with leg straight, and low load long duration stretch (resting with foot on towel roll 5-10 min) Heel Slides: AAROM, Left, 5 reps, Supine Hip ABduction/ADduction: AAROM, Left, 5 reps, Supine Long Arc Quad: Limitations Long Arc Quad Limitations: held due to pain, had pt demo on right side Knee Flexion: AAROM, Left, 5 reps, Seated Goniometric ROM: L knee lacking 15 degrees ext; 60 flexion    General Comments  Educated on safe ice use, no pivots, car transfers,  resting with leg straight, and TED hose during day. Also, encouraged walking every 1-2 hours during day. Educated on HEP with focus on mobility the first weeks. Discussed doing exercises within pain control and if pain increasing could decreased ROM, reps, and stop exercises as needed. Encouraged to perform quad sets and ankle pumps frequently for blood flow and to promote full knee extension.       Pertinent Vitals/Pain Pain Assessment Pain Assessment: 0-10 Pain Score: 8  Pain Location: L knee Pain Descriptors / Indicators: Burning, Sore Pain Intervention(s): Limited activity within patient's tolerance, Monitored during session, Premedicated before session, Repositioned, Ice applied    Home Living                          Prior Function            PT Goals (current goals can now be found in the care plan section) Progress towards PT goals: Progressing toward goals    Frequency    7X/week      PT Plan      Co-evaluation              AM-PAC PT "6 Clicks" Mobility   Outcome Measure  Help needed turning from your back to your side while in a flat bed without using bedrails?: A Little Help needed moving from lying on your back to sitting on the side of a flat bed without using bedrails?: A Little Help needed moving to and from a bed to a chair (including a wheelchair)?: A Little Help needed standing up from a chair using your arms (e.g., wheelchair or bedside chair)?: A Little Help needed to walk in hospital room?: A Little Help needed climbing 3-5 steps with a railing? : A Little 6 Click Score: 18    End of Session Equipment Utilized During Treatment: Gait belt Activity Tolerance: Patient tolerated treatment well Patient left: in chair;with call bell/phone within reach;with family/visitor present Nurse Communication: Mobility status PT Visit Diagnosis: Other abnormalities of gait and mobility (R26.89);Muscle weakness (generalized) (M62.81)     Time:  8119-1478 PT Time Calculation (min) (ACUTE ONLY): 26 min  Charges:    $Gait Training: 8-22 mins $Therapeutic Exercise: 8-22 mins PT General Charges $$ ACUTE PT VISIT: 1 Visit                     Anise Salvo, PT Acute Rehab [facility] [phone]    Rayetta Humphrey 04/18/2023, 12:35 PM

## 2023-04-18 NOTE — Progress Notes (Signed)
Discharge instructions given to patient. D Akirah Storck RN 

## 2023-04-18 NOTE — Plan of Care (Signed)
  Problem: Health Behavior/Discharge Planning: Goal: Ability to manage health-related needs will improve Outcome: Progressing   Problem: Clinical Measurements: Goal: Ability to maintain clinical measurements within normal limits will improve Outcome: Progressing Goal: Will remain free from infection Outcome: Progressing Goal: Diagnostic test results will improve Outcome: Progressing Goal: Cardiovascular complication will be avoided Outcome: Progressing   Problem: Activity: Goal: Risk for activity intolerance will decrease Outcome: Progressing   Problem: Nutrition: Goal: Adequate nutrition will be maintained Outcome: Progressing   Problem: Coping: Goal: Level of anxiety will decrease Outcome: Progressing   Problem: Elimination: Goal: Will not experience complications related to bowel motility Outcome: Progressing Goal: Will not experience complications related to urinary retention Outcome: Progressing   Problem: Pain Managment: Goal: General experience of comfort will improve and/or be controlled Outcome: Progressing   Problem: Safety: Goal: Ability to remain free from injury will improve Outcome: Progressing   Problem: Skin Integrity: Goal: Risk for impaired skin integrity will decrease Outcome: Progressing   Problem: Education: Goal: Knowledge of the prescribed therapeutic regimen will improve Outcome: Progressing   Problem: Activity: Goal: Ability to avoid complications of mobility impairment will improve Outcome: Progressing Goal: Range of joint motion will improve Outcome: Progressing   Problem: Clinical Measurements: Goal: Postoperative complications will be avoided or minimized Outcome: Progressing   Problem: Skin Integrity: Goal: Will show signs of wound healing Outcome: Progressing   Problem: Pain Management: Goal: Pain level will decrease with appropriate interventions Outcome: Not Progressing - Client pain remain consistent despite  intervention   Problem: Clinical Measurements: Goal: Respiratory complications will improve Outcome: Not Applicable

## 2023-04-18 NOTE — TOC Transition Note (Signed)
 Transition of Care [facility]) - Discharge Note   Patient Details  Name: Jonathan Jones MRN: [id number] Date of Birth: March 09, 1960  Transition of Care [facility]) CM/SW Contact:  Amada Jupiter, LCSW Phone Number: 04/18/2023, 10:32 AM   Clinical Narrative:     Met with pt who confirms need for RW.  No DME agency preference.  Order placed with [employer] and item delivered to room.  OPPT already arranged with [facility].  No further TOC needs.  Final next level of care: OP Rehab Barriers to Discharge: No Barriers Identified   Patient Goals and CMS Choice Patient states their goals for this hospitalization and ongoing recovery are:: return home          Discharge Placement                       Discharge Plan and Services Additional resources added to the After Visit Summary for                  DME Arranged: Walker rolling DME Agency: [employer] Date DME Agency Contacted: 04/18/23 Time DME Agency Contacted: [phone] Representative spoke with at DME Agency: Yvonna Alanis            Social Drivers of Health (SDOH) Interventions SDOH Screenings   Food Insecurity: No Food Insecurity (04/17/2023)  Housing: Low Risk  (04/17/2023)  Transportation Needs: No Transportation Needs (04/17/2023)  Utilities: Not At Risk (04/17/2023)  Social Connections: Unknown (04/17/2023)  Tobacco Use: Low Risk  (04/17/2023)     Readmission Risk Interventions     No data to display

## 2023-04-18 NOTE — Progress Notes (Signed)
   Subjective: 1 Day Post-Op Procedure(s) (LRB): ARTHROPLASTY, KNEE, TOTAL (Left) Patient reports pain as mild.   Patient seen in rounds by Dr. Lequita Halt. Patient had issues with orthostatic hypotension yesterday, BP higher this AM. HTN meds ordered to restart this morning. Denies chest pain, SOB, or calf pain. Foley catheter removed this AM.  We will continue therapy today  Objective: Vital signs in last 24 hours: Temp:  [97.2 F (36.2 C)-99.4 F (37.4 C)] 99.4 F (37.4 C) (04/01 0545) Pulse Rate:  [58-71] 63 (04/01 0545) Resp:  [11-18] 17 (04/01 0545) BP: (104-176)/(56-97) 176/83 (04/01 0545) SpO2:  [96 %-100 %] 96 % (04/01 0545)  Intake/Output from previous day:  Intake/Output Summary (Last 24 hours) at 04/18/2023 0813 Last data filed at 04/18/2023 0626 Gross per 24 hour  Intake 2495 ml  Output 3500 ml  Net -1005 ml     Intake/Output this shift: No intake/output data recorded.  Labs: Recent Labs    04/18/23 0354  HGB 11.8*   Recent Labs    04/18/23 0354  WBC 11.3*  RBC 3.90*  HCT 35.9*  PLT 187   Recent Labs    04/18/23 0354  NA 133*  K 3.7  CL 100  CO2 25  BUN 19  CREATININE 0.80  GLUCOSE 127*  CALCIUM 8.3*   No results for input(s): "LABPT", "INR" in the last 72 hours.  Exam: General - Patient is Alert and Oriented Extremity - Neurologically intact Neurovascular intact Sensation intact distally Dorsiflexion/Plantar flexion intact Dressing - dressing C/D/I Motor Function - intact, moving foot and toes well on exam.   Past Medical History:  Diagnosis Date   Aortic stenosis    Arthritis    hands   Complex tear of lateral meniscus of left knee as current injury    Complex tear of medial meniscus of left knee    Coronary artery disease    GERD (gastroesophageal reflux disease)    Heart murmur    Hypertension    Unspecified   Migraine headache    Mixed hyperlipidemia    Presence of permanent cardiac pacemaker    Thoracic aortic aneurysm  (HCC)    4.4 cm ascending aorta (06/2016)    Assessment/Plan: 1 Day Post-Op Procedure(s) (LRB): ARTHROPLASTY, KNEE, TOTAL (Left) Principal Problem:   OA (osteoarthritis) of knee Active Problems:   Osteoarthritis of left knee  Estimated body mass index is 24.41 kg/m as calculated from the following:   Height as of this encounter: 5\' 11"  (1.803 m).   Weight as of this encounter: 79.4 kg. Advance diet Up with therapy D/C IV fluids   Patient's anticipated LOS is less than 2 midnights, meeting these requirements: - Lives within 1 hour of care - Has a competent adult at home to recover with post-op recover - NO history of  - Chronic pain requiring opioids  - Diabetes  - Heart failure  - Stroke  - DVT/VTE  - Cardiac arrhythmia  - Respiratory Failure/COPD  - Renal failure  - Anemia  - Advanced Liver disease   DVT Prophylaxis - Xarelto Weight bearing as tolerated. Continue therapy.  Plan is to go Home after hospital stay. Plan for discharge later today if progresses with therapy and meeting goals. Scheduled for OPPT at [facility] Follow-up in the office in 2 weeks.  The PDMP database was reviewed today prior to any opioid medications being prescribed to this patient.  Arther Abbott, PA-C Orthopedic Surgery [phone] 04/18/2023, 8:13 AM

## 2023-04-19 NOTE — Discharge Summary (Signed)
 Patient ID: KNOWLEDGE ESCANDON MRN: [id number] DOB/AGE: 65-Feb-1962 65 y.o.  Admit date: 04/17/2023 Discharge date: 04/18/2023  Admission Diagnoses:  Principal Problem:   OA (osteoarthritis) of knee Active Problems:   Osteoarthritis of left knee   Discharge Diagnoses:  Same  Past Medical History:  Diagnosis Date   Aortic stenosis    Arthritis    hands   Complex tear of lateral meniscus of left knee as current injury    Complex tear of medial meniscus of left knee    Coronary artery disease    GERD (gastroesophageal reflux disease)    Heart murmur    Hypertension    Unspecified   Migraine headache    Mixed hyperlipidemia    Presence of permanent cardiac pacemaker    Thoracic aortic aneurysm (HCC)    4.4 cm ascending aorta (06/2016)    Surgeries: Procedure(s): ARTHROPLASTY, KNEE, TOTAL on 04/17/2023   Consultants:   Discharged Condition: Improved  Hospital Course: DERIAN DIMALANTA is an 63 y.o. male who was admitted 04/17/2023 for operative treatment ofOA (osteoarthritis) of knee. Patient has severe unremitting pain that affects sleep, daily activities, and work/hobbies. After pre-op clearance the patient was taken to the operating room on 04/17/2023 and underwent  Procedure(s): ARTHROPLASTY, KNEE, TOTAL.    Patient was given perioperative antibiotics:  Anti-infectives (From admission, onward)    Start     Dose/Rate Route Frequency Ordered Stop   04/17/23 1200  ceFAZolin (ANCEF) IVPB 2g/100 mL premix        2 g 200 mL/hr over 30 Minutes Intravenous Every 6 hours 04/17/23 1017 04/17/23 1801   04/17/23 0600  ceFAZolin (ANCEF) IVPB 2g/100 mL premix        2 g 200 mL/hr over 30 Minutes Intravenous On call to O.R. 04/17/23 0540 04/17/23 4098        Patient was given sequential compression devices, early ambulation, and chemoprophylaxis to prevent DVT.  Patient benefited maximally from hospital stay and there were no complications.    Recent vital signs: Patient Vitals for  the past 24 hrs:  BP Temp Temp src Pulse Resp SpO2  04/18/23 0946 (!) 178/90 98.5 F (36.9 C) Oral 66 18 98 %     Recent laboratory studies:  Recent Labs    04/18/23 0354  WBC 11.3*  HGB 11.8*  HCT 35.9*  PLT 187  NA 133*  K 3.7  CL 100  CO2 25  BUN 19  CREATININE 0.80  GLUCOSE 127*  CALCIUM 8.3*     Discharge Medications:   Allergies as of 04/18/2023       Reactions   Chocolate    Migraines         Medication List     STOP taking these medications    aspirin EC 81 MG tablet       TAKE these medications    amoxicillin 500 MG capsule Commonly known as: AMOXIL Take four (4) capsules by mouth as needed one (1) hour prior to dental procedures.   atorvastatin 80 MG tablet Commonly known as: LIPITOR TAKE 1 TABLET(80 MG) BY MOUTH DAILY   carvedilol 25 MG tablet Commonly known as: COREG TAKE 1 TABLET BY MOUTH TWICE DAILY   chlorhexidine 4 % external liquid Commonly known as: HIBICLENS Apply 15 mLs (1 Application total) topically as directed for 30 doses. Use as directed daily for 5 days every other week for 6 weeks.   doxepin 25 MG capsule Commonly known as: SINEQUAN Take 50 mg by  mouth at bedtime.   fenofibrate 160 MG tablet Take 1 tablet (160 mg total) by mouth daily.   losartan 25 MG tablet Commonly known as: COZAAR TAKE 1 TABLET(25 MG) BY MOUTH DAILY   methocarbamol 500 MG tablet Commonly known as: ROBAXIN Take 1 tablet (500 mg total) by mouth every 6 (six) hours as needed for muscle spasms.   mupirocin ointment 2 % Commonly known as: BACTROBAN Place 1 Application into the nose 2 (two) times daily for 60 doses. Use as directed 2 times daily for 5 days every other week for 6 weeks.   ondansetron 4 MG tablet Commonly known as: ZOFRAN Take 1 tablet (4 mg total) by mouth every 6 (six) hours as needed for nausea.   oxyCODONE 5 MG immediate release tablet Commonly known as: Oxy IR/ROXICODONE Take 1-2 tablets (5-10 mg total) by mouth every 6  (six) hours as needed for severe pain (pain score 7-10).   traMADol 50 MG tablet Commonly known as: ULTRAM Take 1-2 tablets (50-100 mg total) by mouth every 6 (six) hours as needed for moderate pain (pain score 4-6).   triamcinolone cream 0.1 % Commonly known as: KENALOG Apply 1 Application topically daily as needed (rash).   Ubrelvy 50 MG Tabs Generic drug: Ubrogepant Take 50 mg by mouth as needed (migraines).   VISINE OP Apply 1 drop to eye daily as needed (redness/dry eyes).   Xarelto 10 MG Tabs tablet Generic drug: rivaroxaban Take 1 tablet (10 mg total) by mouth daily with breakfast for 20 days. Then resume one 81 mg aspirin once a day               Discharge Care Instructions  (From admission, onward)           Start     Ordered   04/18/23 0000  Weight bearing as tolerated        04/18/23 0816   04/18/23 0000  Change dressing       Comments: You may remove the bulky bandage (ACE wrap and gauze) two days after surgery. You will have an adhesive waterproof bandage underneath. Leave this in place until your first follow-up appointment.   04/18/23 0816            Diagnostic Studies: No results found.  Disposition: Discharge disposition: 01-Home or Self Care       Discharge Instructions     Call MD / Call 911   Complete by: As directed    If you experience chest pain or shortness of breath, CALL 911 and be transported to the hospital emergency room.  If you develope a fever above 101 F, pus (white drainage) or increased drainage or redness at the wound, or calf pain, call your surgeon's office.   Change dressing   Complete by: As directed    You may remove the bulky bandage (ACE wrap and gauze) two days after surgery. You will have an adhesive waterproof bandage underneath. Leave this in place until your first follow-up appointment.   Constipation Prevention   Complete by: As directed    Drink plenty of fluids.  Prune juice may be helpful.  You may  use a stool softener, such as Colace (over the counter) 100 mg twice a day.  Use MiraLax (over the counter) for constipation as needed.   Diet - low sodium heart healthy   Complete by: As directed    Do not put a pillow under the knee. Place it under the heel.  Complete by: As directed    Driving restrictions   Complete by: As directed    No driving for two weeks   Post-operative opioid taper instructions:   Complete by: As directed    POST-OPERATIVE OPIOID TAPER INSTRUCTIONS: It is important to wean off of your opioid medication as soon as possible. If you do not need pain medication after your surgery it is ok to stop day one. Opioids include: Codeine, Hydrocodone(Norco, Vicodin), Oxycodone(Percocet, oxycontin) and hydromorphone amongst others.  Long term and even short term use of opiods can cause: Increased pain response Dependence Constipation Depression Respiratory depression And more.  Withdrawal symptoms can include Flu like symptoms Nausea, vomiting And more Techniques to manage these symptoms Hydrate well Eat regular healthy meals Stay active Use relaxation techniques(deep breathing, meditating, yoga) Do Not substitute Alcohol to help with tapering If you have been on opioids for less than two weeks and do not have pain than it is ok to stop all together.  Plan to wean off of opioids This plan should start within one week post op of your joint replacement. Maintain the same interval or time between taking each dose and first decrease the dose.  Cut the total daily intake of opioids by one tablet each day Next start to increase the time between doses. The last dose that should be eliminated is the evening dose.      TED hose   Complete by: As directed    Use stockings (TED hose) for three weeks on both leg(s).  You may remove them at night for sleeping.   Weight bearing as tolerated   Complete by: As directed         Follow-up Information     Aluisio,  Homero Fellers, MD. Schedule an appointment as soon as possible for a visit in 2 week(s).   Specialty: Orthopedic Surgery Contact information: [address] [place] [address] [place] [address] [phone]                  Signed: Arther Abbott 04/19/2023, 9:32 AM

## 2023-04-26 ENCOUNTER — Encounter: Payer: Self-pay | Admitting: Cardiovascular Disease

## 2023-06-16 ENCOUNTER — Other Ambulatory Visit: Payer: Self-pay | Admitting: Cardiovascular Disease

## 2023-06-16 ENCOUNTER — Ambulatory Visit (INDEPENDENT_AMBULATORY_CARE_PROVIDER_SITE_OTHER): Payer: BC Managed Care – PPO

## 2023-06-16 DIAGNOSIS — I442 Atrioventricular block, complete: Secondary | ICD-10-CM

## 2023-06-16 LAB — CUP PACEART REMOTE DEVICE CHECK
Battery Remaining Longevity: 158 mo
Battery Voltage: 3.04 V
Brady Statistic AP VP Percent: 0.29 %
Brady Statistic AP VS Percent: 17.47 %
Brady Statistic AS VP Percent: 0.46 %
Brady Statistic AS VS Percent: 81.77 %
Brady Statistic RA Percent Paced: 17.8 %
Brady Statistic RV Percent Paced: 0.75 %
Date Time Interrogation Session: 20250529195135
Implantable Lead Connection Status: 753985
Implantable Lead Connection Status: 753985
Implantable Lead Implant Date: 20230901
Implantable Lead Implant Date: 20230901
Implantable Lead Location: 753859
Implantable Lead Location: 753860
Implantable Lead Model: 3830
Implantable Lead Model: 5076
Implantable Pulse Generator Implant Date: 20230901
Lead Channel Impedance Value: 304 Ohm
Lead Channel Impedance Value: 342 Ohm
Lead Channel Impedance Value: 380 Ohm
Lead Channel Impedance Value: 551 Ohm
Lead Channel Pacing Threshold Amplitude: 0.625 V
Lead Channel Pacing Threshold Amplitude: 0.625 V
Lead Channel Pacing Threshold Pulse Width: 0.4 ms
Lead Channel Pacing Threshold Pulse Width: 0.4 ms
Lead Channel Sensing Intrinsic Amplitude: 17.75 mV
Lead Channel Sensing Intrinsic Amplitude: 17.75 mV
Lead Channel Sensing Intrinsic Amplitude: 3.125 mV
Lead Channel Sensing Intrinsic Amplitude: 3.125 mV
Lead Channel Setting Pacing Amplitude: 1.5 V
Lead Channel Setting Pacing Amplitude: 2 V
Lead Channel Setting Pacing Pulse Width: 0.4 ms
Lead Channel Setting Sensing Sensitivity: 0.9 mV
Zone Setting Status: 755011
Zone Setting Status: 755011

## 2023-06-21 ENCOUNTER — Ambulatory Visit: Payer: Self-pay | Admitting: Cardiovascular Disease

## 2023-07-04 ENCOUNTER — Encounter: Payer: Self-pay | Admitting: Cardiovascular Disease

## 2023-07-04 ENCOUNTER — Ambulatory Visit: Attending: Cardiovascular Disease | Admitting: Cardiovascular Disease

## 2023-07-04 VITALS — BP 142/78 | HR 65 | Resp 16 | Ht 71.5 in | Wt 178.4 lb

## 2023-07-04 DIAGNOSIS — I442 Atrioventricular block, complete: Secondary | ICD-10-CM | POA: Diagnosis not present

## 2023-07-04 DIAGNOSIS — I4729 Other ventricular tachycardia: Secondary | ICD-10-CM | POA: Insufficient documentation

## 2023-07-04 LAB — CUP PACEART INCLINIC DEVICE CHECK
Date Time Interrogation Session: 20250617092350
Implantable Lead Connection Status: 753985
Implantable Lead Connection Status: 753985
Implantable Lead Implant Date: 20230901
Implantable Lead Implant Date: 20230901
Implantable Lead Location: 753859
Implantable Lead Location: 753860
Implantable Lead Model: 3830
Implantable Lead Model: 5076
Implantable Pulse Generator Implant Date: 20230901

## 2023-07-04 NOTE — Patient Instructions (Signed)
 Medication Instructions:  Your physician recommends that you continue on your current medications as directed. Please refer to the Current Medication list given to you today. *If you need a refill on your cardiac medications before your next appointment, please call your pharmacy*   Follow-Up: At [facility], you and your health needs are our priority.  As part of our continuing mission to provide you with exceptional heart care, our providers are all part of one team.  This team includes your primary Cardiologist (physician) and Advanced Practice Providers or APPs (Physician Assistants and Nurse Practitioners) who all work together to provide you with the care you need, when you need it.  Your next appointment:   2 year(s)  Provider:   Marlane Silver, MD

## 2023-07-04 NOTE — Progress Notes (Signed)
 Cardiology Office Note:    Date:  07/04/2023   ID:  Jonathan Jones, DOB [birth year], MRN [id number]  PCP:  Barnetta Liberty, MD   [facility] Providers Cardiologist:  Antoinette Batman, MD Electrophysiologist:  Efraim Grange, MD     Referring MD: Barnetta Liberty, MD   No chief complaint on file. Needs to establish in device clinic after placement of a pacemaker.  History of Present Illness:    Jonathan Jones is a 63 y.o. 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 Abel Hoe for device management.  He has a Medtronic dual-chamber pacemaker (Azure XT DR MRI) implanted at the [facility] clinic September 2023. He had sinus bradycardia with rates in the 30's for almost a week after surgery.  he has no device related complaints -- no new tenderness, drainage, redness.  He does not have any chest pain, shortness of breath, dizziness.  Arrhythmia History: MDT dual chamber pacemaker placed 09/2021      EKGs/Labs/Other Studies Reviewed:    Cardiac MRI 2015/07/13, TTE 10/21.Stress perfusion imaging, 2015  TTE: 04/19/2021 1. Left ventricular ejection fraction, by estimation, is 70 to 75%. Left  ventricular ejection fraction by 3D volume is 71 %. The left ventricle has  hyperdynamic function. The left ventricle has no regional wall motion  abnormalities. There is mild left  ventricular hypertrophy of the basal-septal segment. Left ventricular  diastolic parameters are indeterminate. Elevated left ventricular  end-diastolic pressure. The average left ventricular global longitudinal  strain is 20.3 %. The global longitudinal  strain is normal.   2. Right ventricular systolic function is normal. The right ventricular  size is mildly enlarged. There is normal pulmonary artery  systolic  pressure. The estimated right ventricular systolic pressure is 18.1 mmHg.   3. Right atrial size was mildly dilated.   4. The mitral valve is normal in structure. Trivial mitral valve  regurgitation. No evidence of mitral stenosis.   5. The aortic valve has been repaired/replaced. There is severe  calcifcation of the aortic valve. There is severe thickening of the aortic  valve. Aortic valve regurgitation is trivial. Moderate to severe aortic  valve stenosis. Aortic valve area, by VTI  measures 0.87 cm. Aortic valve mean gradient measures 30.7 mmHg. Aortic  valve Vmax measures 3.66 m/s.   6. Aortic dilatation noted. Aneurysm of the ascending aorta, measuring 46  mm.   7. The inferior vena cava is normal in size with greater than 50%  respiratory variability, suggesting right atrial pressure of 3 mmHg.   8. Compared to study dated 11/06/2020, there is no significant change   EKG:  EKG is ordered today.  Sinus rhythm with ST&T wave abnormalities  Orders placed or performed in visit on 07/04/23   EKG 12-Lead     Recent Labs: 04/18/2023: BUN 19; Creatinine, Ser 0.80; Hemoglobin 11.8; Platelets 187; Potassium 3.7; Sodium 133         Physical Exam:    VS:  BP (!) 142/78 (BP Location: Right Arm, Patient Position: Sitting, Cuff Size: Normal)   Pulse 65   Resp 16   Ht 5' 11.5 (1.816 m)   Wt 178 lb 6.4 oz (80.9 kg)   SpO2 99%   BMI 24.53 kg/m     Wt Readings from Last 3 Encounters:  07/04/23 178 lb 6.4  oz (80.9 kg)  04/17/23 175 lb (79.4 kg)  04/12/23 175 lb (79.4 kg)     GEN: Well nourished, well developed in no acute distress CARDIAC: RRR, no murmurs, rubs, gallops The device site is normal -- no tenderness, edema, drainage, redness, threatened erosion.  RESPIRATORY:  Normal work of breathing MUSCULOSKELETAL: edema   ASSESSMENT:    1. Heart block AV complete (HCC)   2. NSVT (nonsustained ventricular tachycardia) (HCC)     PLAN:    In order of problems  listed above:  Medtronic dual chamber pacemaker:  Placed for postop sinus bradycardia I reviewed today's device interrogation.  See Paceart for details Device is functioning normally He is not pacemaker dependent  Sick sinus syndrome: post-op, appears to be recovered  History of bicuspid aorta s/p repair, and repair of aortic aneurysm       Medication Adjustments/Labs and Tests Ordered: Current medicines are reviewed at length with the patient today.  Concerns regarding medicines are outlined above.  Orders Placed This Encounter  Procedures   EKG 12-Lead   No orders of the defined types were placed in this encounter.   There are no Patient Instructions on file for this visit.   Signed, Efraim Grange, MD  07/04/2023 9:12 AM    [facility]

## 2023-07-13 ENCOUNTER — Encounter: Payer: Self-pay | Admitting: Cardiovascular Disease

## 2023-07-13 DIAGNOSIS — Z952 Presence of prosthetic heart valve: Secondary | ICD-10-CM

## 2023-07-13 DIAGNOSIS — Q2381 Bicuspid aortic valve: Secondary | ICD-10-CM

## 2023-07-13 DIAGNOSIS — E785 Hyperlipidemia, unspecified: Secondary | ICD-10-CM

## 2023-07-13 DIAGNOSIS — I712 Thoracic aortic aneurysm, without rupture, unspecified: Secondary | ICD-10-CM

## 2023-07-13 DIAGNOSIS — I2510 Atherosclerotic heart disease of native coronary artery without angina pectoris: Secondary | ICD-10-CM

## 2023-07-13 DIAGNOSIS — I10 Essential (primary) hypertension: Secondary | ICD-10-CM

## 2023-07-13 MED ORDER — FENOFIBRATE 160 MG PO TABS: 160.0000 mg | ORAL_TABLET | Freq: Every day | ORAL | 3 refills | Status: AC

## 2023-07-13 MED ORDER — ATORVASTATIN CALCIUM 80 MG PO TABS
80.0000 mg | ORAL_TABLET | Freq: Every day | ORAL | 3 refills | Status: AC
Start: 2023-07-13 — End: ?

## 2023-08-02 NOTE — Progress Notes (Signed)
 Remote pacemaker transmission.

## 2023-09-15 ENCOUNTER — Ambulatory Visit (INDEPENDENT_AMBULATORY_CARE_PROVIDER_SITE_OTHER): Payer: BC Managed Care – PPO

## 2023-09-15 DIAGNOSIS — I442 Atrioventricular block, complete: Secondary | ICD-10-CM | POA: Diagnosis not present

## 2023-09-16 LAB — CUP PACEART REMOTE DEVICE CHECK
Battery Remaining Longevity: 155 mo
Battery Voltage: 3.03 V
Brady Statistic AP VP Percent: 0.14 %
Brady Statistic AP VS Percent: 36.41 %
Brady Statistic AS VP Percent: 0.19 %
Brady Statistic AS VS Percent: 63.26 %
Brady Statistic RA Percent Paced: 36.63 %
Brady Statistic RV Percent Paced: 0.33 %
Date Time Interrogation Session: 20250828214357
Implantable Lead Connection Status: 753985
Implantable Lead Connection Status: 753985
Implantable Lead Implant Date: 20230901
Implantable Lead Implant Date: 20230901
Implantable Lead Location: 753859
Implantable Lead Location: 753860
Implantable Lead Model: 3830
Implantable Lead Model: 5076
Implantable Pulse Generator Implant Date: 20230901
Lead Channel Impedance Value: 304 Ohm
Lead Channel Impedance Value: 361 Ohm
Lead Channel Impedance Value: 380 Ohm
Lead Channel Impedance Value: 532 Ohm
Lead Channel Pacing Threshold Amplitude: 0.625 V
Lead Channel Pacing Threshold Amplitude: 0.75 V
Lead Channel Pacing Threshold Pulse Width: 0.4 ms
Lead Channel Pacing Threshold Pulse Width: 0.4 ms
Lead Channel Sensing Intrinsic Amplitude: 20.875 mV
Lead Channel Sensing Intrinsic Amplitude: 20.875 mV
Lead Channel Sensing Intrinsic Amplitude: 3.5 mV
Lead Channel Sensing Intrinsic Amplitude: 3.5 mV
Lead Channel Setting Pacing Amplitude: 1.5 V
Lead Channel Setting Pacing Amplitude: 2 V
Lead Channel Setting Pacing Pulse Width: 0.4 ms
Lead Channel Setting Sensing Sensitivity: 0.9 mV
Zone Setting Status: 755011
Zone Setting Status: 755011

## 2023-09-21 ENCOUNTER — Ambulatory Visit: Payer: Self-pay | Admitting: Cardiovascular Disease

## 2023-09-25 NOTE — Progress Notes (Signed)
 Remote PPM Transmission

## 2023-12-18 ENCOUNTER — Ambulatory Visit: Payer: BC Managed Care – PPO

## 2023-12-18 DIAGNOSIS — I442 Atrioventricular block, complete: Secondary | ICD-10-CM | POA: Diagnosis not present

## 2023-12-19 LAB — CUP PACEART REMOTE DEVICE CHECK
Battery Remaining Longevity: 151 mo
Battery Voltage: 3.03 V
Brady Statistic AP VP Percent: 0.43 %
Brady Statistic AP VS Percent: 48.36 %
Brady Statistic AS VP Percent: 0.46 %
Brady Statistic AS VS Percent: 50.74 %
Brady Statistic RA Percent Paced: 48.85 %
Brady Statistic RV Percent Paced: 0.89 %
Date Time Interrogation Session: 20251130213717
Implantable Lead Connection Status: 753985
Implantable Lead Connection Status: 753985
Implantable Lead Implant Date: 20230901
Implantable Lead Implant Date: 20230901
Implantable Lead Location: 753859
Implantable Lead Location: 753860
Implantable Lead Model: 3830
Implantable Lead Model: 5076
Implantable Pulse Generator Implant Date: 20230901
Lead Channel Impedance Value: 304 Ohm
Lead Channel Impedance Value: 361 Ohm
Lead Channel Impedance Value: 380 Ohm
Lead Channel Impedance Value: 551 Ohm
Lead Channel Pacing Threshold Amplitude: 0.625 V
Lead Channel Pacing Threshold Amplitude: 0.625 V
Lead Channel Pacing Threshold Pulse Width: 0.4 ms
Lead Channel Pacing Threshold Pulse Width: 0.4 ms
Lead Channel Sensing Intrinsic Amplitude: 23.25 mV
Lead Channel Sensing Intrinsic Amplitude: 23.25 mV
Lead Channel Sensing Intrinsic Amplitude: 3.75 mV
Lead Channel Sensing Intrinsic Amplitude: 3.75 mV
Lead Channel Setting Pacing Amplitude: 1.5 V
Lead Channel Setting Pacing Amplitude: 2 V
Lead Channel Setting Pacing Pulse Width: 0.4 ms
Lead Channel Setting Sensing Sensitivity: 0.9 mV
Zone Setting Status: 755011
Zone Setting Status: 755011

## 2023-12-21 ENCOUNTER — Ambulatory Visit: Payer: Self-pay | Admitting: Cardiovascular Disease

## 2023-12-22 NOTE — Progress Notes (Signed)
 Remote PPM Transmission

## 2024-03-15 ENCOUNTER — Ambulatory Visit: Payer: BC Managed Care – PPO

## 2024-03-18 ENCOUNTER — Ambulatory Visit

## 2024-06-14 ENCOUNTER — Ambulatory Visit: Payer: BC Managed Care – PPO

## 2024-06-17 ENCOUNTER — Ambulatory Visit

## 2024-09-13 ENCOUNTER — Ambulatory Visit: Payer: BC Managed Care – PPO

## 2024-09-16 ENCOUNTER — Ambulatory Visit

## 2024-12-13 ENCOUNTER — Ambulatory Visit: Payer: BC Managed Care – PPO

## 2024-12-16 ENCOUNTER — Ambulatory Visit
